# Patient Record
Sex: Male | Born: 1960 | Race: White | Hispanic: No | Marital: Married
Health system: Midwestern US, Community
[De-identification: ages and names within clinical notes are randomized; demographics above are authoritative.]

## PROBLEM LIST (undated history)

## (undated) DIAGNOSIS — E785 Hyperlipidemia, unspecified: Secondary | ICD-10-CM

## (undated) DIAGNOSIS — M1990 Unspecified osteoarthritis, unspecified site: Secondary | ICD-10-CM

## (undated) DIAGNOSIS — K611 Rectal abscess: Secondary | ICD-10-CM

## (undated) DIAGNOSIS — K649 Unspecified hemorrhoids: Secondary | ICD-10-CM

## (undated) HISTORY — DX: Hyperlipidemia, unspecified: E78.5

## (undated) HISTORY — DX: Rectal abscess: K61.1

## (undated) HISTORY — DX: Unspecified hemorrhoids: K64.9

---

## 2004-07-18 HISTORY — PX: HERNIA REPAIR: SHX51

## 2004-07-18 HISTORY — PX: ANAL FISSURE REPAIR: SHX2312

## 2017-07-19 ENCOUNTER — Other Ambulatory Visit: Payer: Self-pay | Admitting: Physician Assistant

## 2017-08-08 ENCOUNTER — Ambulatory Visit: Payer: Self-pay | Admitting: Internal Medicine

## 2017-08-28 ENCOUNTER — Ambulatory Visit: Payer: No Typology Code available for payment source | Admitting: Internal Medicine

## 2017-08-28 ENCOUNTER — Encounter: Payer: Self-pay | Admitting: Internal Medicine

## 2017-08-28 VITALS — BP 126/84 | HR 52 | Temp 97.5°F | Resp 16 | Ht 71.0 in | Wt 190.0 lb

## 2017-08-28 DIAGNOSIS — R5383 Other fatigue: Secondary | ICD-10-CM

## 2017-08-28 DIAGNOSIS — Z0000 Encounter for general adult medical examination without abnormal findings: Secondary | ICD-10-CM | POA: Diagnosis not present

## 2017-08-28 DIAGNOSIS — E559 Vitamin D deficiency, unspecified: Secondary | ICD-10-CM

## 2017-08-28 DIAGNOSIS — E782 Mixed hyperlipidemia: Secondary | ICD-10-CM

## 2017-08-28 DIAGNOSIS — Z125 Encounter for screening for malignant neoplasm of prostate: Secondary | ICD-10-CM

## 2017-08-28 DIAGNOSIS — Z79899 Other long term (current) drug therapy: Secondary | ICD-10-CM

## 2017-08-28 DIAGNOSIS — Z136 Encounter for screening for cardiovascular disorders: Secondary | ICD-10-CM

## 2017-08-28 DIAGNOSIS — I10 Essential (primary) hypertension: Secondary | ICD-10-CM | POA: Diagnosis not present

## 2017-08-28 DIAGNOSIS — E291 Testicular hypofunction: Secondary | ICD-10-CM

## 2017-08-28 DIAGNOSIS — Z0001 Encounter for general adult medical examination with abnormal findings: Secondary | ICD-10-CM

## 2017-08-28 DIAGNOSIS — Z131 Encounter for screening for diabetes mellitus: Secondary | ICD-10-CM

## 2017-08-28 NOTE — Patient Instructions (Signed)

## 2017-08-28 NOTE — Progress Notes (Signed)
[facility]   Lucky Cowboy, M.D.     Dyanne Carrel. Steffanie Dunn, P.A.-C Judd Gaudier, DNP [facility]                [address]                [place], [place]. [address] Telephone [phone] Telefax [phone] Annual  Screening/Preventative Visit  & Comprehensive Evaluation & Examination     This very nice 57 y.o. MWM recently moved here from [place]  presents to establish and for a Screening/Preventative Visit & comprehensive evaluation.  He does relate hx/o elevated cholesterol in the past.      Patient is screened expectantly for elevated BP and today's BP is at goal - 126/84. Patient denies any cardiac symptoms as chest pain, palpitations, shortness of breath, dizziness or ankle swelling.     Patient's lipidemia have been elevated in the past and for which he's been recommended diet.          Patient is also expectantly screened for prediabetes and he denies reactive hypoglycemic symptoms, visual blurring, diabetic polys or paresthesias.      Finally, patient is anticipated to have Vitamin D Deficiency.  No Known Allergies   History reviewed. No pertinent past medical history.   Health Maintenance  Topic Date Due  . Hepatitis C Screening  [birth year]  . HIV Screening  03/14/1978  . TETANUS/TDAP  03/14/1982  . COLONOSCOPY  03/14/2013  . INFLUENZA VACCINE  02/15/2017   Last Colon - age 83 & recc 10 yr f/u  Family History  Problem Relation Age of Onset  . Dementia Mother   . Cancer Father   . Dementia Father     Social History   Socioeconomic History  . Marital status: Married    Spouse name: Victorino Dike  . Number of children: 1 daughter  Occupational History  . Furniture [employer]  Tobacco Use  . Smoking status: Never Smoker  . Smokeless tobacco: Never Used  Substance and Sexual Activity  . Alcohol use: Yes    Comment: occasional  . Drug use: No  . Sexual activity: Active     ROS Constitutional: Denies fever, chills, weight loss/gain, headaches, insomnia,  night sweats or change in appetite. Does c/o fatigue. Eyes: Denies redness, blurred vision, diplopia, discharge, itchy or watery eyes.  ENT: Denies discharge, congestion, post nasal drip, epistaxis, sore throat, earache, hearing loss, dental pain, Tinnitus, Vertigo, Sinus pain or snoring.  Cardio: Denies chest pain, palpitations, irregular heartbeat, syncope, dyspnea, diaphoresis, orthopnea, PND, claudication or edema Respiratory: denies cough, dyspnea, DOE, pleurisy, hoarseness, laryngitis or wheezing.  Gastrointestinal: Denies dysphagia, heartburn, reflux, water brash, pain, cramps, nausea, vomiting, bloating, diarrhea, constipation, hematemesis, melena, hematochezia, jaundice or hemorrhoids Genitourinary: Denies dysuria, frequency, urgency, nocturia, hesitancy, discharge, hematuria or flank pain Musculoskeletal: Denies arthralgia, myalgia, stiffness, Jt. Swelling, pain, limp or strain/sprain. Denies Falls. Skin: Denies puritis, rash, hives, warts, acne, eczema or change in skin lesion Neuro: No weakness, tremor, incoordination, spasms, paresthesia or pain Psychiatric: Denies confusion, memory loss or sensory loss. Denies Depression. Endocrine: Denies change in weight, skin, hair change, nocturia, and paresthesia, diabetic polys, visual blurring or hyper / hypo glycemic episodes.  Heme/Lymph: No excessive bleeding, bruising or enlarged lymph nodes.  Physical Exam  BP 126/84   Pulse (!) 52   Temp (!) 97.5 F (36.4 C)   Resp 16   Ht 5\' 11"  (1.803 m)   Wt 190 lb (86.2 kg)  BMI 26.50 kg/m   General Appearance: Well nourished and well groomed and in no apparent distress.  Eyes: PERRLA, EOMs, conjunctiva no swelling or erythema, normal fundi and vessels. Sinuses: No frontal/maxillary tenderness ENT/Mouth: EACs patent / TMs  nl. Nares clear without erythema, swelling, mucoid exudates. Oral hygiene is good. No  erythema, swelling, or exudate. Tongue normal, non-obstructing. Tonsils not swollen or erythematous. Hearing normal.  Neck: Supple, thyroid not palpable. No bruits, nodes or JVD. Respiratory: Respiratory effort normal.  BS equal and clear bilateral without rales, rhonci, wheezing or stridor. Cardio: Heart sounds are normal with regular rate and rhythm and no murmurs, rubs or gallops. Peripheral pulses are normal and equal bilaterally without edema. No aortic or femoral bruits. Chest: symmetric with normal excursions and percussion.  Abdomen: Soft, with Nl bowel sounds. Nontender, no guarding, rebound, hernias, masses, or organomegaly.  Lymphatics: Non tender without lymphadenopathy.  Genitourinary: No hernias.Testes nl. DRE - prostate nl for age - smooth & firm w/o nodules. Musculoskeletal: Full ROM all peripheral extremities, joint stability, 5/5 strength, and normal gait. Skin: Warm and dry without rashes, lesions, cyanosis, clubbing or  ecchymosis.  Neuro: Cranial nerves intact, reflexes equal bilaterally. Normal muscle tone, no cerebellar symptoms. Sensation intact.  Pysch: Alert and oriented X 3 with normal affect, insight and judgment appropriate.   Assessment and Plan  1. Annual Preventative/Screening Exam   2. Screening for hypertension  - Urinalysis, Routine w reflex microscopic - Microalbumin / creatinine urine ratio - CBC with Differential/Platelet - BASIC METABOLIC PANEL WITH GFR - Magnesium - TSH  3. Hyperlipidemia, mixed  - Hepatic function panel - Lipid panel - TSH  4. Diabetes mellitus screening  - Hemoglobin A1c - Insulin, random  5. Vitamin D deficiency  - VITAMIN D 25 Hydroxy   6. Prostate cancer screening  - PSA  7. Fatigue  - Iron,Total/Total Iron Binding Cap - Vitamin B12 - Testosterone - CBC with Differential/Platelet - TSH  8. Medication management  - Urinalysis, Routine w reflex microscopic - Microalbumin / creatinine urine ratio -  CBC with Differential/Platelet - BASIC METABOLIC PANEL WITH GFR - Hepatic function panel - Magnesium - Lipid panel - TSH - Hemoglobin A1c - Insulin, random - VITAMIN D 25 Hydroxy         Patient was counseled in prudent diet, weight control to achieve/maintain BMI less than 25, BP monitoring, regular exercise and medications as discussed.  Discussed med effects and SE's. Routine screening labs and tests as requested with regular follow-up as recommended. Over 40 minutes of exam, counseling, chart review and high complex critical decision making was performed

## 2017-08-29 ENCOUNTER — Other Ambulatory Visit: Payer: Self-pay | Admitting: Internal Medicine

## 2017-08-29 ENCOUNTER — Encounter: Payer: Self-pay | Admitting: Internal Medicine

## 2017-08-29 DIAGNOSIS — E559 Vitamin D deficiency, unspecified: Secondary | ICD-10-CM

## 2017-08-29 DIAGNOSIS — E782 Mixed hyperlipidemia: Secondary | ICD-10-CM

## 2017-08-29 LAB — CBC WITH DIFFERENTIAL/PLATELET
BASOS PCT: 0.5 %
Basophils Absolute: 21 cells/uL (ref 0–200)
Eosinophils Absolute: 92 cells/uL (ref 15–500)
Eosinophils Relative: 2.2 %
HCT: 45.4 % (ref 38.5–50.0)
Hemoglobin: 15.5 g/dL (ref 13.2–17.1)
Lymphs Abs: 1168 cells/uL (ref 850–3900)
MCH: 30.7 pg (ref 27.0–33.0)
MCHC: 34.1 g/dL (ref 32.0–36.0)
MCV: 89.9 fL (ref 80.0–100.0)
MPV: 10.1 fL (ref 7.5–12.5)
Monocytes Relative: 6 %
NEUTROS PCT: 63.5 %
Neutro Abs: 2667 cells/uL (ref 1500–7800)
PLATELETS: 214 10*3/uL (ref 140–400)
RBC: 5.05 10*6/uL (ref 4.20–5.80)
RDW: 12.7 % (ref 11.0–15.0)
TOTAL LYMPHOCYTE: 27.8 %
WBC: 4.2 10*3/uL (ref 3.8–10.8)
WBCMIX: 252 {cells}/uL (ref 200–950)

## 2017-08-29 LAB — URINALYSIS, ROUTINE W REFLEX MICROSCOPIC
Bilirubin Urine: NEGATIVE
Glucose, UA: NEGATIVE
HGB URINE DIPSTICK: NEGATIVE
Leukocytes, UA: NEGATIVE
Nitrite: NEGATIVE
PROTEIN: NEGATIVE
Specific Gravity, Urine: 1.011 (ref 1.001–1.03)
pH: 6 (ref 5.0–8.0)

## 2017-08-29 LAB — HEPATIC FUNCTION PANEL
AG Ratio: 1.9 (calc) (ref 1.0–2.5)
ALBUMIN MSPROF: 4.6 g/dL (ref 3.6–5.1)
ALKALINE PHOSPHATASE (APISO): 61 U/L (ref 40–115)
ALT: 15 U/L (ref 9–46)
AST: 13 U/L (ref 10–35)
Bilirubin, Direct: 0.1 mg/dL (ref 0.0–0.2)
Globulin: 2.4 g/dL (calc) (ref 1.9–3.7)
Indirect Bilirubin: 0.5 mg/dL (calc) (ref 0.2–1.2)
TOTAL PROTEIN: 7 g/dL (ref 6.1–8.1)
Total Bilirubin: 0.6 mg/dL (ref 0.2–1.2)

## 2017-08-29 LAB — HEMOGLOBIN A1C
EAG (MMOL/L): 5.2 (calc)
Hgb A1c MFr Bld: 4.9 % of total Hgb (ref <5.7)
MEAN PLASMA GLUCOSE: 94 (calc)

## 2017-08-29 LAB — BASIC METABOLIC PANEL WITH GFR
BUN: 13 mg/dL (ref 7–25)
CALCIUM: 10.1 mg/dL (ref 8.6–10.3)
CHLORIDE: 105 mmol/L (ref 98–110)
CO2: 29 mmol/L (ref 20–32)
Creat: 0.79 mg/dL (ref 0.70–1.33)
GFR, EST NON AFRICAN AMERICAN: 102 mL/min/{1.73_m2} (ref >=60)
GFR, Est African American: 118 mL/min/{1.73_m2} (ref >=60)
GLUCOSE: 88 mg/dL (ref 65–99)
POTASSIUM: 4.4 mmol/L (ref 3.5–5.3)
SODIUM: 139 mmol/L (ref 135–146)

## 2017-08-29 LAB — IRON, TOTAL/TOTAL IRON BINDING CAP
%SAT: 22 % (calc) (ref 15–60)
IRON: 77 ug/dL (ref 50–180)
TIBC: 350 mcg/dL (calc) (ref 250–425)

## 2017-08-29 LAB — LIPID PANEL
CHOLESTEROL: 229 mg/dL — AB (ref <200)
HDL: 59 mg/dL (ref >40)
LDL Cholesterol (Calc): 141 mg/dL (calc) — ABNORMAL HIGH
Non-HDL Cholesterol (Calc): 170 mg/dL (calc) — ABNORMAL HIGH (ref <130)
TRIGLYCERIDES: 156 mg/dL — AB (ref <150)
Total CHOL/HDL Ratio: 3.9 (calc) (ref <5.0)

## 2017-08-29 LAB — INSULIN, RANDOM: INSULIN: 3.5 u[IU]/mL (ref 2.0–19.6)

## 2017-08-29 LAB — TSH: TSH: 1.39 mIU/L (ref 0.40–4.50)

## 2017-08-29 LAB — MICROALBUMIN / CREATININE URINE RATIO
CREATININE, URINE: 66 mg/dL (ref 20–320)
Microalb, Ur: <0.2 mg/dL

## 2017-08-29 LAB — MAGNESIUM: MAGNESIUM: 2.3 mg/dL (ref 1.5–2.5)

## 2017-08-29 LAB — PSA: PSA: 1.6 ng/mL (ref <=4.0)

## 2017-08-29 LAB — VITAMIN D 25 HYDROXY (VIT D DEFICIENCY, FRACTURES): Vit D, 25-Hydroxy: 19 ng/mL — ABNORMAL LOW (ref 30–100)

## 2017-08-29 LAB — VITAMIN B12: Vitamin B-12: 569 pg/mL (ref 200–1100)

## 2017-08-29 LAB — TESTOSTERONE: TESTOSTERONE: 473 ng/dL (ref 250–827)

## 2017-11-27 ENCOUNTER — Other Ambulatory Visit: Payer: No Typology Code available for payment source

## 2017-11-27 DIAGNOSIS — Z13220 Encounter for screening for lipoid disorders: Secondary | ICD-10-CM | POA: Diagnosis not present

## 2017-11-27 DIAGNOSIS — E782 Mixed hyperlipidemia: Secondary | ICD-10-CM

## 2017-11-27 DIAGNOSIS — E559 Vitamin D deficiency, unspecified: Secondary | ICD-10-CM | POA: Diagnosis not present

## 2017-11-28 LAB — LIPID PANEL
Cholesterol: 237 mg/dL — ABNORMAL HIGH (ref <200)
HDL: 67 mg/dL (ref >40)
LDL Cholesterol (Calc): 139 mg/dL (calc) — ABNORMAL HIGH
Non-HDL Cholesterol (Calc): 170 mg/dL (calc) — ABNORMAL HIGH (ref <130)
TRIGLYCERIDES: 172 mg/dL — AB (ref <150)
Total CHOL/HDL Ratio: 3.5 (calc) (ref <5.0)

## 2017-11-28 LAB — VITAMIN D 25 HYDROXY (VIT D DEFICIENCY, FRACTURES): VIT D 25 HYDROXY: 31 ng/mL (ref 30–100)

## 2018-03-07 ENCOUNTER — Ambulatory Visit: Payer: Self-pay | Admitting: Internal Medicine

## 2018-03-14 ENCOUNTER — Encounter: Payer: Self-pay | Admitting: Internal Medicine

## 2018-03-14 ENCOUNTER — Ambulatory Visit (INDEPENDENT_AMBULATORY_CARE_PROVIDER_SITE_OTHER): Payer: No Typology Code available for payment source | Admitting: Internal Medicine

## 2018-03-14 VITALS — BP 116/74 | HR 52 | Temp 97.6°F | Resp 14 | Ht 71.0 in | Wt 189.2 lb

## 2018-03-14 DIAGNOSIS — E782 Mixed hyperlipidemia: Secondary | ICD-10-CM

## 2018-03-14 DIAGNOSIS — E559 Vitamin D deficiency, unspecified: Secondary | ICD-10-CM | POA: Diagnosis not present

## 2018-03-14 DIAGNOSIS — Z79899 Other long term (current) drug therapy: Secondary | ICD-10-CM | POA: Diagnosis not present

## 2018-03-14 DIAGNOSIS — Z136 Encounter for screening for cardiovascular disorders: Secondary | ICD-10-CM | POA: Diagnosis not present

## 2018-03-14 NOTE — Patient Instructions (Signed)

## 2018-03-14 NOTE — Progress Notes (Signed)
This very nice 57 y.o. MWM presents for 6 month follow up with HTN, HLD, Pre-Diabetes and Vitamin D Deficiency.      Patient is followed expecantly  for labile HTN & BP has been controlled at home. Today's BP is at goal -  116/74. Patient has had no complaints of any cardiac type chest pain, palpitations, dyspnea / orthopnea / PND, dizziness, claudication, or dependent edema.     Hyperlipidemia is not  controlled with diet. Last Lipids were not at goal: Lab Results  Component Value Date   CHOL 237 (H) 11/27/2017   HDL 67 11/27/2017   LDLCALC 139 (H) 11/27/2017   TRIG 172 (H) 11/27/2017   CHOLHDL 3.5 11/27/2017      Also, the patient is likewise expectantly for PreDiabetes and has had no symptoms of reactive hypoglycemia, diabetic polys, paresthesias or visual blurring.  Last A1c was Normal & at goal: Lab Results  Component Value Date   HGBA1C 4.9 08/28/2017      Further, the patient also has history of Vitamin D Deficiency and supplements vitamin D without any suspected side-effects. Last vitamin D was   Lab Results  Component Value Date   VD25OH 31 11/27/2017   On no meds   No Known Allergies   PMHx: Hyperlipidemia. Vit D Deficiency.  Past Surgical History:  Procedure Laterality Date  . ANAL FISSURE REPAIR  2006  . HERNIA REPAIR Right 2006   FHx:    Reviewed / unchanged  SHx:    Reviewed / unchanged   Systems Review:  Constitutional: Denies fever, chills, wt changes, headaches, insomnia, fatigue, night sweats, change in appetite. Eyes: Denies redness, blurred vision, diplopia, discharge, itchy, watery eyes.  ENT: Denies discharge, congestion, post nasal drip, epistaxis, sore throat, earache, hearing loss, dental pain, tinnitus, vertigo, sinus pain, snoring.  CV: Denies chest pain, palpitations, irregular heartbeat, syncope, dyspnea, diaphoresis, orthopnea, PND, claudication or edema. Respiratory: denies cough, dyspnea, DOE, pleurisy, hoarseness, laryngitis,  wheezing.  Gastrointestinal: Denies dysphagia, odynophagia, heartburn, reflux, water brash, abdominal pain or cramps, nausea, vomiting, bloating, diarrhea, constipation, hematemesis, melena, hematochezia  or hemorrhoids. Genitourinary: Denies dysuria, frequency, urgency, nocturia, hesitancy, discharge, hematuria or flank pain. Musculoskeletal: Denies arthralgias, myalgias, stiffness, jt. swelling, pain, limping or strain/sprain.  Skin: Denies pruritus, rash, hives, warts, acne, eczema or change in skin lesion(s). Neuro: No weakness, tremor, incoordination, spasms, paresthesia or pain. Psychiatric: Denies confusion, memory loss or sensory loss. Endo: Denies change in weight, skin or hair change.  Heme/Lymph: No excessive bleeding, bruising or enlarged lymph nodes.  Physical Exam  BP 116/74   Pulse (!) 52   Temp 97.6 F (36.4 C)   Resp 14   Ht 5\' 11"  (1.803 m)   Wt 189 lb 3.2 oz (85.8 kg)   SpO2 98%   BMI 26.39 kg/m   Appears  well nourished, well groomed  and in no distress.  Eyes: PERRLA, EOMs, conjunctiva no swelling or erythema. Sinuses: No frontal/maxillary tenderness ENT/Mouth: EAC's clear, TM's nl w/o erythema, bulging. Nares clear w/o erythema, swelling, exudates. Oropharynx clear without erythema or exudates. Oral hygiene is good. Tongue normal, non obstructing. Hearing intact.  Neck: Supple. Thyroid not palpable. Car 2+/2+ without bruits, nodes or JVD. Chest: Respirations nl with BS clear & equal w/o rales, rhonchi, wheezing or stridor.  Cor: Heart sounds normal w/ regular rate and rhythm without sig. murmurs, gallops, clicks or rubs. Peripheral pulses normal and equal  without edema.  Abdomen: Soft & bowel sounds  normal. Non-tender w/o guarding, rebound, hernias, masses or organomegaly.  Lymphatics: Unremarkable.  Musculoskeletal: Full ROM all peripheral extremities, joint stability, 5/5 strength and normal gait.  Skin: Warm, dry without exposed rashes, lesions or  ecchymosis apparent.  Neuro: Cranial nerves intact, reflexes equal bilaterally. Sensory-motor testing grossly intact. Tendon reflexes grossly intact.  Pysch: Alert & oriented x 3.  Insight and judgement nl & appropriate. No ideations.  Assessment and Plan:  1. Screening for hypertension  - Continue medication, monitor blood pressure at home.  - Continue DASH diet.  Reminder to go to the ER if any CP,  SOB, nausea, dizziness, severe HA, changes vision/speech.   2. Hyperlipidemia, mixed  - Continue diet/meds, exercise,& lifestyle modifications.  - Continue monitor periodic cholesterol/liver & renal functions   - Lipid panel  3. Vitamin D deficiency  - Continue diet, exercise, lifestyle modifications.  - Monitor appropriate labs.  - Continue supplementation.   - VITAMIN D 25 Hydroxyl  4. Medication management  - Lipid panel - VITAMIN D 25 Hydroxyol      Discussed  regular exercise, BP monitoring, weight control to achieve/maintain BMI less than 25 and discussed med and SE's. Recommended labs to assess and monitor clinical status with further disposition pending results of labs. Over 30 minutes of exam, counseling, chart review was performed.

## 2018-03-15 ENCOUNTER — Other Ambulatory Visit: Payer: Self-pay | Admitting: Internal Medicine

## 2018-03-15 DIAGNOSIS — E782 Mixed hyperlipidemia: Secondary | ICD-10-CM

## 2018-03-15 LAB — LIPID PANEL
CHOLESTEROL: 217 mg/dL — AB (ref <200)
HDL: 63 mg/dL (ref >40)
LDL Cholesterol (Calc): 124 mg/dL (calc) — ABNORMAL HIGH
Non-HDL Cholesterol (Calc): 154 mg/dL (calc) — ABNORMAL HIGH (ref <130)
Total CHOL/HDL Ratio: 3.4 (calc) (ref <5.0)
Triglycerides: 178 mg/dL — ABNORMAL HIGH (ref <150)

## 2018-03-15 LAB — VITAMIN D 25 HYDROXY (VIT D DEFICIENCY, FRACTURES): VIT D 25 HYDROXY: 43 ng/mL (ref 30–100)

## 2018-03-15 MED ORDER — ROSUVASTATIN CALCIUM 40 MG PO TABS: ORAL_TABLET | ORAL | 5 refills | Status: DC

## 2018-03-19 ENCOUNTER — Encounter: Payer: Self-pay | Admitting: Internal Medicine

## 2018-06-21 DIAGNOSIS — Z6826 Body mass index (BMI) 26.0-26.9, adult: Secondary | ICD-10-CM | POA: Insufficient documentation

## 2018-06-21 NOTE — Progress Notes (Signed)
FOLLOW UP  Assessment and Plan:   Cholesterol Newly on rosuvastatin 20 mg every other day  Continue low cholesterol diet and exercise.  Check lipid panel.   Overweight Long discussion about weight loss, diet, and exercise Recommended diet heavy in fruits and veggies and low in animal meats, cheeses, and dairy products, appropriate calorie intake Discussed ideal weight for height and initial weight goal  Will follow up in 3 months  Vitamin D Def Below goal at last visit;  He has increased to 10000 IU daily  continue supplementation to maintain goal of 70-100 Check Vit D level  Continue diet and meds as discussed. Further disposition pending results of labs. Discussed med's effects and SE's.   Over 30 minutes of exam, counseling, chart review, and critical decision making was performed.   Future Appointments  Date Time Provider Department Center  09/20/2018  2:00 PM Lucky Cowboy, MD [id number] None    ----------------------------------------------------------------------------------------------------------------------  HPI 57 y.o. male  presents for 3 month follow up on cholesterol, weight and vitamin D deficiency.   BMI is Body mass index is 26.64 kg/m., he has been working on diet and exercise, he is avoiding red meat. He is limiting processed food as well. He walks every other day, lifts weight.  Wt Readings from Last 3 Encounters:  06/25/18 191 lb (86.6 kg)  03/14/18 189 lb 3.2 oz (85.8 kg)  08/28/17 190 lb (86.2 kg)   Today their BP is BP: 110/66  He does workout. He denies chest pain, shortness of breath, dizziness.   He is on cholesterol medication Rosuvastatin 20 mg every other and denies myalgias. His cholesterol is not at goal. The cholesterol last visit was:   Lab Results  Component Value Date   CHOL 217 (H) 03/14/2018   HDL 63 03/14/2018   LDLCALC 124 (H) 03/14/2018   TRIG 178 (H) 03/14/2018   CHOLHDL 3.4 03/14/2018   Patient is on Vitamin D  supplement,   Lab Results  Component Value Date   VD25OH 43 03/14/2018       Current Medications:  Current Outpatient Medications on File Prior to Visit  Medication Sig  . Cholecalciferol (VITAMIN D3 PO) Take 25,000 Units by mouth 5 (five) times daily.  . rosuvastatin (CRESTOR) 40 MG tablet Take 1/2 to 1 tablet daily or as directed for Cholesterol   No current facility-administered medications on file prior to visit.      Allergies: No Known Allergies   Medical History:  Past Medical History:  Diagnosis Date  . Hyperlipidemia    Family history- Reviewed and unchanged Social history- Reviewed and unchanged   Review of Systems:  Review of Systems  Constitutional: Negative for malaise/fatigue and weight loss.  HENT: Negative for hearing loss and tinnitus.   Eyes: Negative for blurred vision and double vision.  Respiratory: Negative for cough, shortness of breath and wheezing.   Cardiovascular: Negative for chest pain, palpitations, orthopnea, claudication and leg swelling.  Gastrointestinal: Negative for abdominal pain, blood in stool, constipation, diarrhea, heartburn, melena, nausea and vomiting.  Genitourinary: Negative.   Musculoskeletal: Negative for joint pain and myalgias.  Skin: Negative for rash.  Neurological: Negative for dizziness, tingling, sensory change, weakness and headaches.  Endo/Heme/Allergies: Negative for polydipsia.  Psychiatric/Behavioral: Negative.   All other systems reviewed and are negative.     Physical Exam: BP 110/66   Pulse (!) 49   Temp 97.7 F (36.5 C)   Ht 5\' 11"  (1.803 m)   Wt 191 lb (86.6  kg)   SpO2 96%   BMI 26.64 kg/m  Wt Readings from Last 3 Encounters:  06/25/18 191 lb (86.6 kg)  03/14/18 189 lb 3.2 oz (85.8 kg)  08/28/17 190 lb (86.2 kg)   General Appearance: Well nourished, in no apparent distress. Eyes: PERRLA, conjunctiva no swelling or erythema Sinuses: No Frontal/maxillary tenderness ENT/Mouth: Ext aud canals  clear, TMs without erythema, bulging. No erythema, swelling, or exudate on post pharynx.  Tonsils not swollen or erythematous. Hearing normal.  Neck: Supple, thyroid normal.  Respiratory: Respiratory effort normal, BS equal bilaterally without rales, rhonchi, wheezing or stridor.  Cardio: RRR with no MRGs. Brisk peripheral pulses without edema.  Abdomen: Soft, + BS.  Non tender, no guarding, rebound, hernias, masses. Lymphatics: Non tender without lymphadenopathy.  Musculoskeletal: Full ROM, 5/5 strength, Normal gait Skin: Warm, dry without rashes, lesions, ecchymosis.  Neuro: Cranial nerves intact. No cerebellar symptoms.  Psych: Awake and oriented X 3, normal affect, Insight and Judgment appropriate.    Dan Maker, NP 10:43 AM [facility]

## 2018-06-25 ENCOUNTER — Ambulatory Visit (INDEPENDENT_AMBULATORY_CARE_PROVIDER_SITE_OTHER): Payer: No Typology Code available for payment source | Admitting: Adult Health

## 2018-06-25 ENCOUNTER — Encounter: Payer: Self-pay | Admitting: Adult Health

## 2018-06-25 VITALS — BP 110/66 | HR 49 | Temp 97.7°F | Ht 71.0 in | Wt 191.0 lb

## 2018-06-25 DIAGNOSIS — E559 Vitamin D deficiency, unspecified: Secondary | ICD-10-CM

## 2018-06-25 DIAGNOSIS — Z79899 Other long term (current) drug therapy: Secondary | ICD-10-CM | POA: Diagnosis not present

## 2018-06-25 DIAGNOSIS — Z6826 Body mass index (BMI) 26.0-26.9, adult: Secondary | ICD-10-CM

## 2018-06-25 DIAGNOSIS — E782 Mixed hyperlipidemia: Secondary | ICD-10-CM

## 2018-06-25 NOTE — Patient Instructions (Addendum)
Goals    . LDL CALC < 100        Preventing High Cholesterol Cholesterol is a waxy, fat-like substance that your body needs in small amounts. Your liver makes all the cholesterol that your body needs. Having high cholesterol (hypercholesterolemia) increases your risk for heart disease and stroke. Extra (excess) cholesterol comes from the food you eat, such as animal-based fat (saturated fat) from meat and some dairy products. High cholesterol can often be prevented with diet and lifestyle changes. If you already have high cholesterol, you can control it with diet and lifestyle changes, as well as medicine. What nutrition changes can be made?  Eat less saturated fat. Foods that contain saturated fat include red meat and some dairy products.  Avoid processed meats, like bacon and lunch meats.  Avoid trans fats, which are found in margarine and some baked goods.  Avoid foods and beverages that have added sugars.  Eat more fruits, vegetables, and whole grains.  Choose healthy sources of protein, such as fish, poultry, and nuts.  Choose healthy sources of fat, such as: ? Nuts. ? Vegetable oils, especially olive oil. ? Fish that have healthy fats (omega-3 fatty acids), such as mackerel or salmon. What lifestyle changes can be made?  Lose weight if you are overweight. Losing 5-10 lb (2.3-4.5 kg) can help prevent or control high cholesterol and reduce your risk for diabetes and high blood pressure. Ask your health care provider to help you with a diet and exercise plan to safely lose weight.  Get enough exercise. Do at least 150 minutes of moderate-intensity exercise each week. ? You could do this in short exercise sessions several times a day, or you could do longer exercise sessions a few times a week. For example, you could take a brisk 10-minute walk or bike ride, 3 times a day, for 5 days a week.  Do not smoke. If you need help quitting, ask your health care provider.  Limit your  alcohol intake. If you drink alcohol, limit alcohol intake to no more than 1 drink a day for nonpregnant women and 2 drinks a day for men. One drink equals 12 oz of beer, 5 oz of wine, or 1 oz of hard liquor. Why are these changes important? If you have high cholesterol, deposits (plaques) may build up on the walls of your blood vessels. Plaques make the arteries narrower and stiffer, which can restrict or block blood flow and cause blood clots to form. This greatly increases your risk for heart attack and stroke. Making diet and lifestyle changes can reduce your risk for these life-threatening conditions. What can I do to lower my risk?  Manage your risk factors for high cholesterol. Talk with your health care provider about all of your risk factors and how to lower your risk.  Manage other conditions that you have, such as diabetes or high blood pressure (hypertension).  Have your cholesterol checked at regular intervals.  Keep all follow-up visits as told by your health care provider. This is important. How is this treated? In addition to diet and lifestyle changes, your health care provider may recommend medicines to help lower cholesterol, such as a medicine to reduce the amount of cholesterol made in your liver. You may need medicine if:  Diet and lifestyle changes do not lower your cholesterol enough.  You have high cholesterol and other risk factors for heart disease or stroke.  Take over-the-counter and prescription medicines only as told by your health care   provider. Where to find more information:  American Heart Association: www.heart.org/HEARTORG/Conditions/Cholesterol/Cholesterol_UCM_001089_SubHomePage.jsp  National Heart, Lung, and Blood Institute: www.nhlbi.nih.gov/health/resources/heart/heart-cholesterol-hbc-what-html Summary  High cholesterol increases your risk for heart disease and stroke. By keeping your cholesterol level low, you can reduce your risk for these  conditions.  Diet and lifestyle changes are the most important steps in preventing high cholesterol.  Work with your health care provider to manage your risk factors, and have your blood tested regularly. This information is not intended to replace advice given to you by your health care provider. Make sure you discuss any questions you have with your health care provider. Document Released: 07/19/2015 Document Revised: 03/12/2016 Document Reviewed: 03/12/2016 Elsevier Interactive Patient Education  2018 [employer] Inc.  

## 2018-06-26 ENCOUNTER — Other Ambulatory Visit: Payer: Self-pay | Admitting: Adult Health

## 2018-06-26 LAB — COMPLETE METABOLIC PANEL WITH GFR
AG Ratio: 1.9 (calc) (ref 1.0–2.5)
ALT: 15 U/L (ref 9–46)
AST: 15 U/L (ref 10–35)
Albumin: 4.5 g/dL (ref 3.6–5.1)
Alkaline phosphatase (APISO): 65 U/L (ref 40–115)
BUN: 15 mg/dL (ref 7–25)
CO2: 30 mmol/L (ref 20–32)
Calcium: 10.3 mg/dL (ref 8.6–10.3)
Chloride: 104 mmol/L (ref 98–110)
Creat: 0.83 mg/dL (ref 0.70–1.33)
GFR, EST AFRICAN AMERICAN: 115 mL/min/{1.73_m2} (ref >=60)
GFR, Est Non African American: 99 mL/min/{1.73_m2} (ref >=60)
GLOBULIN: 2.4 g/dL (ref 1.9–3.7)
Glucose, Bld: 77 mg/dL (ref 65–99)
Potassium: 4.3 mmol/L (ref 3.5–5.3)
Sodium: 139 mmol/L (ref 135–146)
Total Bilirubin: 0.7 mg/dL (ref 0.2–1.2)
Total Protein: 6.9 g/dL (ref 6.1–8.1)

## 2018-06-26 LAB — LIPID PANEL
Cholesterol: 138 mg/dL (ref <200)
HDL: 61 mg/dL (ref >40)
LDL Cholesterol (Calc): 60 mg/dL (calc)
Non-HDL Cholesterol (Calc): 77 mg/dL (calc) (ref <130)
Total CHOL/HDL Ratio: 2.3 (calc) (ref <5.0)
Triglycerides: 86 mg/dL (ref <150)

## 2018-06-26 LAB — VITAMIN D 25 HYDROXY (VIT D DEFICIENCY, FRACTURES): Vit D, 25-Hydroxy: 51 ng/mL (ref 30–100)

## 2018-09-20 ENCOUNTER — Encounter: Payer: Self-pay | Admitting: Internal Medicine

## 2018-11-27 ENCOUNTER — Encounter: Payer: Self-pay | Admitting: Internal Medicine

## 2018-11-27 NOTE — Progress Notes (Signed)
   R  E  S  C  H  E  D  U L  E D  Due   To    Recent  COVID-19        Exposure                                                                                                                                                                  This very nice 58 y.o. MWM presents for a Screening /Preventative Visit & comprehensive evaluation and management of multiple medical co-morbidities.  Patient has been followed for HTN, HLD, Prediabetes and Vitamin D Deficiency.      Patient is followed expectantly for elevated BP and  BP has been controlled at home.  Today's  . Patient denies any cardiac symptoms as chest pain, palpitations, shortness of breath, dizziness or ankle swelling. BP Readings from Last 3 Encounters:  06/25/18 110/66  03/14/18 116/74  08/28/17 126/84       Patient's hyperlipidemia is controlled with diet and medications. Patient denies myalgias or other medication SE's. Last lipids were at goal: Lab Results  Component Value Date   CHOL 138 06/25/2018   HDL 61 06/25/2018   LDLCALC 60 06/25/2018   TRIG 86 06/25/2018   CHOLHDL 2.3 06/25/2018      Patient is also screened expectantly for glucose intolerance and patient denies reactive hypoglycemic symptoms, visual blurring, diabetic polys or paresthesias. Last A1c was Normal & at goal: Lab Results  Component Value Date   HGBA1C 4.9 08/28/2017       Finally, patient has history of Vitamin D Deficiency ("19" / Feb 2019) and last vitamin D was near goal (70-100): Lab Results  Component Value Date   VD25OH 51 06/25/2018

## 2018-11-28 ENCOUNTER — Ambulatory Visit: Payer: Self-pay | Admitting: Internal Medicine

## 2019-02-19 ENCOUNTER — Ambulatory Visit (INDEPENDENT_AMBULATORY_CARE_PROVIDER_SITE_OTHER): Payer: No Typology Code available for payment source | Admitting: Internal Medicine

## 2019-02-19 ENCOUNTER — Encounter: Payer: Self-pay | Admitting: Internal Medicine

## 2019-02-19 ENCOUNTER — Other Ambulatory Visit: Payer: Self-pay

## 2019-02-19 VITALS — BP 126/72 | HR 64 | Temp 97.2°F | Resp 16 | Ht 71.0 in | Wt 186.6 lb

## 2019-02-19 DIAGNOSIS — R030 Elevated blood-pressure reading, without diagnosis of hypertension: Secondary | ICD-10-CM

## 2019-02-19 DIAGNOSIS — Z111 Encounter for screening for respiratory tuberculosis: Secondary | ICD-10-CM

## 2019-02-19 DIAGNOSIS — Z0001 Encounter for general adult medical examination with abnormal findings: Secondary | ICD-10-CM

## 2019-02-19 DIAGNOSIS — R7309 Other abnormal glucose: Secondary | ICD-10-CM | POA: Diagnosis not present

## 2019-02-19 DIAGNOSIS — Z136 Encounter for screening for cardiovascular disorders: Secondary | ICD-10-CM | POA: Diagnosis not present

## 2019-02-19 DIAGNOSIS — Z79899 Other long term (current) drug therapy: Secondary | ICD-10-CM

## 2019-02-19 DIAGNOSIS — E782 Mixed hyperlipidemia: Secondary | ICD-10-CM

## 2019-02-19 DIAGNOSIS — Z125 Encounter for screening for malignant neoplasm of prostate: Secondary | ICD-10-CM

## 2019-02-19 DIAGNOSIS — Z1211 Encounter for screening for malignant neoplasm of colon: Secondary | ICD-10-CM

## 2019-02-19 DIAGNOSIS — E559 Vitamin D deficiency, unspecified: Secondary | ICD-10-CM | POA: Diagnosis not present

## 2019-02-19 DIAGNOSIS — R5383 Other fatigue: Secondary | ICD-10-CM

## 2019-02-19 NOTE — Progress Notes (Signed)
Annual  Screening/Preventative Visit  & Comprehensive Evaluation & Examination     This very nice 58 y.o. MWM presents for a Screening /Preventative Visit & comprehensive evaluation and management of multiple medical co-morbidities.  Patient has been followed for labile HTN, HLD, Prediabetes and Vitamin D Deficiency.     Patient is followed expectantly for elevated BP and  BP has been controlled at home.  Today's BP is at goal - 126/72. Patient denies any cardiac symptoms as chest pain, palpitations, shortness of breath, dizziness or ankle swelling.     Patient's hyperlipidemia is controlled with diet and Rosuvastatin. Patient denies myalgias or other medication SE's. Last lipids were at goal: Lab Results  Component Value Date   CHOL 138 06/25/2018   HDL 61 06/25/2018   LDLCALC 60 06/25/2018   TRIG 86 06/25/2018   CHOLHDL 2.3 06/25/2018      Patient is also screened expectantly for glucose intolerance and patient denies reactive hypoglycemic symptoms, visual blurring, diabetic polys or paresthesias. Last A1c was Normal & at goal: Lab Results  Component Value Date   HGBA1C 4.9 08/28/2017       Finally, patient has history of Vitamin D Deficiency ("19" / Feb 2019) and last vitamin D was near goal (70-100): Lab Results  Component Value Date   VD25OH 51 06/25/2018   Current Outpatient Medications on File Prior to Visit  Medication Sig  . Cholecalciferol (VITAMIN D3 PO) Take 10,000 Units by mouth daily.   . rosuvastatin (CRESTOR) 40 MG tablet Take 1/2 to 1 tablet daily or as directed for Cholesterol   No current facility-administered medications on file prior to visit.    No Known Allergies   Past Medical History:  Diagnosis Date  . Hyperlipidemia    Health Maintenance  Topic Date Due  . Hepatitis C Screening  01/24/1963  . HIV Screening  03/14/1978  . INFLUENZA VACCINE  02/16/2019  . TETANUS/TDAP  07/19/2019 (Originally 03/14/1982)  . COLONOSCOPY  02/16/2023   Last Colon  - 2014 in [place] - & was advised 10 yr f/u due 2024  Past Surgical History:  Procedure Laterality Date  . ANAL FISSURE REPAIR  2006  . HERNIA REPAIR Right 2006   Family History  Problem Relation Age of Onset  . Dementia Mother   . Cancer Father   . Dementia Father    Social History   Socioeconomic History  . Marital status: Married    Spouse name: Victorino Dike  . Number of children: 1 daughter  Occupational History  . Works in Insurance account manager in [employer]  Tobacco Use  . Smoking status: Never Smoker  . Smokeless tobacco: Never Used  Substance and Sexual Activity  . Alcohol use: Yes    Alcohol/week: 6.0 standard drinks    Types: 3 Glasses of wine, 3 Cans of beer per week    Comment: ocassional  . Drug use: No  . Sexual activity: Yes    ROS Constitutional: Denies fever, chills, weight loss/gain, headaches, insomnia,  night sweats or change in appetite. Does c/o fatigue. Eyes: Denies redness, blurred vision, diplopia, discharge, itchy or watery eyes.  ENT: Denies discharge, congestion, post nasal drip, epistaxis, sore throat, earache, hearing loss, dental pain, Tinnitus, Vertigo, Sinus pain or snoring.  Cardio: Denies chest pain, palpitations, irregular heartbeat, syncope, dyspnea, diaphoresis, orthopnea, PND, claudication or edema Respiratory: denies cough, dyspnea, DOE, pleurisy, hoarseness, laryngitis or wheezing.  Gastrointestinal: Denies dysphagia, heartburn, reflux, water brash, pain, cramps, nausea, vomiting, bloating, diarrhea, constipation, hematemesis,  melena, hematochezia, jaundice or hemorrhoids Genitourinary: Denies dysuria, frequency, urgency, nocturia, hesitancy, discharge, hematuria or flank pain Musculoskeletal: Denies arthralgia, myalgia, stiffness, Jt. Swelling, pain, limp or strain/sprain. Denies Falls. Skin: Denies puritis, rash, hives, warts, acne, eczema or change in skin lesion Neuro: No weakness, tremor, incoordination, spasms, paresthesia or  pain Psychiatric: Denies confusion, memory loss or sensory loss. Denies Depression. Endocrine: Denies change in weight, skin, hair change, nocturia, and paresthesia, diabetic polys, visual blurring or hyper / hypo glycemic episodes.  Heme/Lymph: No excessive bleeding, bruising or enlarged lymph nodes.  Physical Exam  BP 126/72   Pulse 64   Temp (!) 97.2 F (36.2 C)   Resp 16   Ht 5\' 11"  (1.803 m)   Wt 186 lb 9.6 oz (84.6 kg)   BMI 26.03 kg/m   General Appearance: Well nourished and well groomed and in no apparent distress.  Eyes: PERRLA, EOMs, conjunctiva no swelling or erythema, normal fundi and vessels. Sinuses: No frontal/maxillary tenderness ENT/Mouth: EACs patent / TMs  nl. Nares clear without erythema, swelling, mucoid exudates. Oral hygiene is good. No erythema, swelling, or exudate. Tongue normal, non-obstructing. Tonsils not swollen or erythematous. Hearing normal.  Neck: Supple, thyroid not palpable. No bruits, nodes or JVD. Respiratory: Respiratory effort normal.  BS equal and clear bilateral without rales, rhonci, wheezing or stridor. Cardio: Heart sounds are normal with regular rate and rhythm and no murmurs, rubs or gallops. Peripheral pulses are normal and equal bilaterally without edema. No aortic or femoral bruits. Chest: symmetric with normal excursions and percussion.  Abdomen: Soft, with Nl bowel sounds. Nontender, no guarding, rebound, hernias, masses, or organomegaly.  Lymphatics: Non tender without lymphadenopathy.  Musculoskeletal: Full ROM all peripheral extremities, joint stability, 5/5 strength, and normal gait. Skin: Warm and dry without rashes, lesions, cyanosis, clubbing or  ecchymosis.  Neuro: Cranial nerves intact, reflexes equal bilaterally. Normal muscle tone, no cerebellar symptoms. Sensation intact.  Pysch: Alert and oriented X 3 with normal affect, insight and judgment appropriate.   Assessment and Plan  1. Annual Preventative/Screening Exam    2. Elevated BP without diagnosis of hypertension  - EKG 12-Lead - [place], RETROPERITNL ABD,  LTD - Urinalysis, Routine w reflex microscopic - DG Chest 2 View; Future - Microalbumin / creatinine urine ratio - CBC with Differential/Platelet - COMPLETE METABOLIC PANEL WITH GFR - Magnesium - TSH  3. Hyperlipidemia, mixed  - EKG 12-Lead - [place], RETROPERITNL ABD,  LTD - Urinalysis, Routine w reflex microscopic - Lipid panel - TSH  4. Abnormal glucose  - EKG 12-Lead - [place], RETROPERITNL ABD,  LTD - Hemoglobin A1c - Insulin, random  5. Screening-pulmonary TB  - TB Skin Test  6. Vitamin D deficiency  - VITAMIN D 25 Hydroxyl  7. Screening for colorectal cancer  - POC Hemoccult Bld/Stl  8. Prostate cancer screening  - PSA  9. Screening for ischemic heart disease  - EKG 12-Lead  10. Screening for AAA (aortic abdominal aneurysm)  - [place], RETROPERITNL ABD,  LTD  11. Fatigue, unspecified type  - Iron,Total/Total Iron Binding Cap - Vitamin B12 - Testosterone - CBC with Differential/Platelet  12. Medication management  - Urinalysis, Routine w reflex microscopic - Microalbumin / creatinine urine ratio - CBC with Differential/Platelet - COMPLETE METABOLIC PANEL WITH GFR - Magnesium - Lipid panel - TSH - Hemoglobin A1c - Insulin, random - VITAMIN D 25 Hydroxyl       Patient was counseled in prudent diet, weight control to achieve/maintain BMI less than 25, BP  monitoring, regular exercise and medications as discussed.  Discussed med effects and SE's. Routine screening labs and tests as requested with regular follow-up as recommended. Over 40 minutes of exam, counseling, chart review and high complex critical decision making was performed   Dave MawWilliam D Rolf Fells, MD

## 2019-02-19 NOTE — Patient Instructions (Signed)

## 2019-02-20 LAB — MAGNESIUM: Magnesium: 2.3 mg/dL (ref 1.5–2.5)

## 2019-02-20 LAB — CBC WITH DIFFERENTIAL/PLATELET
Absolute Monocytes: 270 cells/uL (ref 200–950)
Basophils Absolute: 19 cells/uL (ref 0–200)
Basophils Relative: 0.5 %
Eosinophils Absolute: 70 cells/uL (ref 15–500)
Eosinophils Relative: 1.9 %
HCT: 42.9 % (ref 38.5–50.0)
Hemoglobin: 14.9 g/dL (ref 13.2–17.1)
Lymphs Abs: 932 cells/uL (ref 850–3900)
MCH: 31.2 pg (ref 27.0–33.0)
MCHC: 34.7 g/dL (ref 32.0–36.0)
MCV: 89.9 fL (ref 80.0–100.0)
MPV: 9.7 fL (ref 7.5–12.5)
Monocytes Relative: 7.3 %
Neutro Abs: 2409 cells/uL (ref 1500–7800)
Neutrophils Relative %: 65.1 %
Platelets: 197 10*3/uL (ref 140–400)
RBC: 4.77 10*6/uL (ref 4.20–5.80)
RDW: 12.9 % (ref 11.0–15.0)
Total Lymphocyte: 25.2 %
WBC: 3.7 10*3/uL — ABNORMAL LOW (ref 3.8–10.8)

## 2019-02-20 LAB — URINALYSIS, ROUTINE W REFLEX MICROSCOPIC
Bilirubin Urine: NEGATIVE
Glucose, UA: NEGATIVE
Hgb urine dipstick: NEGATIVE
Ketones, ur: NEGATIVE
Leukocytes,Ua: NEGATIVE
Nitrite: NEGATIVE
Protein, ur: NEGATIVE
Specific Gravity, Urine: 1.004 (ref 1.001–1.03)
pH: 7.5 (ref 5.0–8.0)

## 2019-02-20 LAB — COMPLETE METABOLIC PANEL WITH GFR
AG Ratio: 2 (calc) (ref 1.0–2.5)
ALT: 13 U/L (ref 9–46)
AST: 13 U/L (ref 10–35)
Albumin: 4.5 g/dL (ref 3.6–5.1)
Alkaline phosphatase (APISO): 66 U/L (ref 35–144)
BUN: 13 mg/dL (ref 7–25)
CO2: 25 mmol/L (ref 20–32)
Calcium: 10.1 mg/dL (ref 8.6–10.3)
Chloride: 106 mmol/L (ref 98–110)
Creat: 0.76 mg/dL (ref 0.70–1.33)
GFR, Est African American: 119 mL/min/{1.73_m2} (ref >=60)
GFR, Est Non African American: 103 mL/min/{1.73_m2} (ref >=60)
Globulin: 2.3 g/dL (calc) (ref 1.9–3.7)
Glucose, Bld: 91 mg/dL (ref 65–99)
Potassium: 4.2 mmol/L (ref 3.5–5.3)
Sodium: 138 mmol/L (ref 135–146)
Total Bilirubin: 0.6 mg/dL (ref 0.2–1.2)
Total Protein: 6.8 g/dL (ref 6.1–8.1)

## 2019-02-20 LAB — TSH: TSH: 1.39 mIU/L (ref 0.40–4.50)

## 2019-02-20 LAB — LIPID PANEL
Cholesterol: 163 mg/dL (ref <200)
HDL: 56 mg/dL (ref >=40)
LDL Cholesterol (Calc): 75 mg/dL (calc)
Non-HDL Cholesterol (Calc): 107 mg/dL (calc) (ref <130)
Total CHOL/HDL Ratio: 2.9 (calc) (ref <5.0)
Triglycerides: 227 mg/dL — ABNORMAL HIGH (ref <150)

## 2019-02-20 LAB — INSULIN, RANDOM: Insulin: 3.2 u[IU]/mL

## 2019-02-20 LAB — HEMOGLOBIN A1C
Hgb A1c MFr Bld: 5 % of total Hgb (ref <5.7)
Mean Plasma Glucose: 97 (calc)
eAG (mmol/L): 5.4 (calc)

## 2019-02-20 LAB — IRON, TOTAL/TOTAL IRON BINDING CAP
%SAT: 31 % (calc) (ref 20–48)
Iron: 105 ug/dL (ref 50–180)
TIBC: 341 mcg/dL (calc) (ref 250–425)

## 2019-02-20 LAB — MICROALBUMIN / CREATININE URINE RATIO
Creatinine, Urine: 16 mg/dL — ABNORMAL LOW (ref 20–320)
Microalb, Ur: <0.2 mg/dL

## 2019-02-20 LAB — VITAMIN B12: Vitamin B-12: 416 pg/mL (ref 200–1100)

## 2019-02-20 LAB — PSA: PSA: 2.2 ng/mL (ref <=4.0)

## 2019-02-20 LAB — VITAMIN D 25 HYDROXY (VIT D DEFICIENCY, FRACTURES): Vit D, 25-Hydroxy: 43 ng/mL (ref 30–100)

## 2019-02-20 LAB — TESTOSTERONE: Testosterone: 521 ng/dL (ref 250–827)

## 2019-02-21 LAB — TB SKIN TEST
Induration: 0 mm
TB Skin Test: NEGATIVE

## 2019-05-14 ENCOUNTER — Other Ambulatory Visit: Payer: Self-pay

## 2019-05-14 DIAGNOSIS — Z20822 Contact with and (suspected) exposure to covid-19: Secondary | ICD-10-CM

## 2019-05-15 LAB — NOVEL CORONAVIRUS, NAA: SARS-CoV-2, NAA: NOT DETECTED

## 2019-05-24 NOTE — Progress Notes (Signed)
FOLLOW UP  Assessment and Plan:   Cholesterol At LDL goal <100 on rosuvastatin 20 mg every other day  Trigs remain modestly elevated; historically with very mild elevations only on non-fasting labs, suggested addition of omega 3 supplement/fish oil or fenofibrate if trending up Continue low cholesterol diet and exercise.  Check lipid panel.   Overweight Long discussion about weight loss, diet, and exercise Recommended diet heavy in fruits and veggies and low in animal meats, cheeses, and dairy products, appropriate calorie intake Discussed ideal weight for height  Will follow up in 3 months  Vitamin D Def He has been taking more consistently  Defer Vit D level to next OV  Continue diet and meds as discussed. Further disposition pending results of labs. Discussed med's effects and SE's.   Over 30 minutes of exam, counseling, chart review, and critical decision making was performed.   Future Appointments  Date Time Provider Department Center  09/02/2019  3:30 PM Lucky Cowboy, MD [facility] None  03/30/2020  3:00 PM Lucky Cowboy, MD [facility] None    ----------------------------------------------------------------------------------------------------------------------  HPI 58 y.o. male  presents for 3 month follow up on cholesterol, weight and vitamin D deficiency.   BMI is Body mass index is 26.5 kg/m., he has been working on diet and exercise, he is avoiding red meat. He is limiting processed food as well. He walks every other day, lifts weight, back in the gym since last month.  Wt Readings from Last 3 Encounters:  05/27/19 190 lb (86.2 kg)  02/19/19 186 lb 9.6 oz (84.6 kg)  06/25/18 191 lb (86.6 kg)   Today their BP is BP: 110/62  He does workout. He denies chest pain, shortness of breath, dizziness.   He is on cholesterol medication Rosuvastatin 20 mg every other day and denies myalgias. His LDL cholesterol is at goal. Trigs remain moderately elevated. The  cholesterol last visit was:   Lab Results  Component Value Date   CHOL 163 02/19/2019   HDL 56 02/19/2019   LDLCALC 75 02/19/2019   TRIG 227 (H) 02/19/2019   CHOLHDL 2.9 02/19/2019   Patient is on Vitamin D supplement,   Lab Results  Component Value Date   VD25OH 43 02/19/2019       Current Medications:  Current Outpatient Medications on File Prior to Visit  Medication Sig  . Cholecalciferol (VITAMIN D3 PO) Take 10,000 Units by mouth daily.   . rosuvastatin (CRESTOR) 40 MG tablet Take 1/2 to 1 tablet daily or as directed for Cholesterol   No current facility-administered medications on file prior to visit.      Allergies: No Known Allergies   Medical History:  Past Medical History:  Diagnosis Date  . Hyperlipidemia    Family history- Reviewed and unchanged Social history- Reviewed and unchanged   Review of Systems:  Review of Systems  Constitutional: Negative for malaise/fatigue and weight loss.  HENT: Negative for hearing loss and tinnitus.   Eyes: Negative for blurred vision and double vision.  Respiratory: Negative for cough, shortness of breath and wheezing.   Cardiovascular: Negative for chest pain, palpitations, orthopnea, claudication and leg swelling.  Gastrointestinal: Negative for abdominal pain, blood in stool, constipation, diarrhea, heartburn, melena, nausea and vomiting.  Genitourinary: Negative.   Musculoskeletal: Negative for joint pain and myalgias.  Skin: Negative for rash.  Neurological: Negative for dizziness, tingling, sensory change, weakness and headaches.  Endo/Heme/Allergies: Negative for polydipsia.  Psychiatric/Behavioral: Negative.   All other systems reviewed and are negative.  Physical Exam: BP 110/62   Pulse (!) 55   Temp 97.7 F (36.5 C)   Ht 5\' 11"  (1.803 m)   Wt 190 lb (86.2 kg)   SpO2 98%   BMI 26.50 kg/m  Wt Readings from Last 3 Encounters:  05/27/19 190 lb (86.2 kg)  02/19/19 186 lb 9.6 oz (84.6 kg)  06/25/18  191 lb (86.6 kg)   General Appearance: Well nourished, in no apparent distress. Eyes: PERRLA, conjunctiva no swelling or erythema Sinuses: No Frontal/maxillary tenderness ENT/Mouth: Ext aud canals clear, TMs without erythema, bulging. No erythema, swelling, or exudate on post pharynx.  Tonsils not swollen or erythematous. Hearing normal.  Neck: Supple, thyroid normal.  Respiratory: Respiratory effort normal, BS equal bilaterally without rales, rhonchi, wheezing or stridor.  Cardio: RRR with no MRGs. Brisk peripheral pulses without edema.  Abdomen: Soft, + BS.  Non tender, no guarding, rebound, hernias, masses. Lymphatics: Non tender without lymphadenopathy.  Musculoskeletal: Full ROM, 5/5 strength, Normal gait Skin: Warm, dry without rashes, lesions, ecchymosis.  Neuro: Cranial nerves intact. No cerebellar symptoms.  Psych: Awake and oriented X 3, normal affect, Insight and Judgment appropriate.    Izora Ribas, NP 4:17 PM [facility]

## 2019-05-27 ENCOUNTER — Other Ambulatory Visit: Payer: Self-pay

## 2019-05-27 ENCOUNTER — Encounter: Payer: Self-pay | Admitting: Adult Health

## 2019-05-27 ENCOUNTER — Ambulatory Visit (INDEPENDENT_AMBULATORY_CARE_PROVIDER_SITE_OTHER): Payer: No Typology Code available for payment source | Admitting: Adult Health

## 2019-05-27 VITALS — BP 110/62 | HR 55 | Temp 97.7°F | Ht 71.0 in | Wt 190.0 lb

## 2019-05-27 DIAGNOSIS — E782 Mixed hyperlipidemia: Secondary | ICD-10-CM | POA: Diagnosis not present

## 2019-05-27 DIAGNOSIS — Z6826 Body mass index (BMI) 26.0-26.9, adult: Secondary | ICD-10-CM

## 2019-05-27 DIAGNOSIS — E559 Vitamin D deficiency, unspecified: Secondary | ICD-10-CM | POA: Diagnosis not present

## 2019-05-27 NOTE — Patient Instructions (Signed)
Goals    . LDL CALC < 100       On review, it appears mildly elevated triglycerides are most likely due to non-fasting labs, but will attach lifestyle interventions to review and be aware of   This test is very sensitive and may respond to particularly fatty foods consumed within 72 hours of the test     High Triglycerides Eating Plan Triglycerides are a type of fat in the blood. High levels of triglycerides can increase your risk of heart disease and stroke. If your triglyceride levels are high, choosing the right foods can help lower your triglycerides and keep your heart healthy. Work with your health care provider or a diet and nutrition specialist (dietitian) to develop an eating plan that is right for you. What are tips for following this plan? General guidelines   Lose weight, if you are overweight. For most people, losing 5-10 lbs (2-5 kg) helps lower triglyceride levels. A weight-loss plan may include. ? 30 minutes of exercise at least 5 days a week. ? Reducing the amount of calories, sugar, and fat you eat.  Eat a wide variety of fresh fruits, vegetables, and whole grains. These foods are high in fiber.  Eat foods that contain healthy fats, such as fatty fish, nuts, seeds, and olive oil.  Avoid foods that are high in added sugar, added salt (sodium), saturated fat, and trans fat.  Avoid low-fiber, refined carbohydrates such as white bread, crackers, noodles, and white rice.  Avoid foods with partially hydrogenated oils (trans fats), such as fried foods or stick margarine.  Limit alcohol intake to no more than 1 drink a day for nonpregnant women and 2 drinks a day for men. One drink equals 12 oz of beer, 5 oz of wine, or 1 oz of hard liquor. Your health care provider may recommend that you drink less depending on your overall health. Reading food labels  Check food labels for the amount of saturated fat. Choose foods with no or very little saturated fat.  Check food  labels for the amount of trans fat. Choose foods with no trans fat.  Check food labels for the amount of cholesterol. Choose foods low in cholesterol. Ask your dietitian how much cholesterol you should have each day.  Check food labels for the amount of sodium. Choose foods with less than 140 milligrams (mg) per serving. Shopping  Buy dairy products labeled as nonfat (skim) or low-fat (1%).  Avoid buying processed or prepackaged foods. These are often high in added sugar, sodium, and fat. Cooking  Choose healthy fats when cooking, such as olive oil or canola oil.  Cook foods using lower fat methods, such as baking, broiling, boiling, or grilling.  Make your own sauces, dressings, and marinades when possible, instead of buying them. Store-bought sauces, dressings, and marinades are often high in sodium and sugar. Meal planning  Eat more home-cooked food and less restaurant, buffet, and fast food.  Eat fatty fish at least 2 times each week. Examples of fatty fish include salmon, trout, mackerel, tuna, and herring.  If you eat whole eggs, do not eat more than 3 egg yolks per week. What foods are recommended? The items listed may not be a complete list. Talk with your dietitian about what dietary choices are best for you. Grains Whole wheat or whole grain breads, crackers, cereals, and pasta. Unsweetened oatmeal. Bulgur. Barley. Quinoa. Brown rice. Whole wheat flour tortillas. Vegetables Fresh or frozen vegetables. Low-sodium canned vegetables. Fruits All fresh,  canned (in natural juice), or frozen fruits. Meats and other protein foods Skinless chicken or [place]. Ground chicken or [place]. Lean cuts of pork, trimmed of fat. Fish and seafood, especially salmon, trout, and herring. Egg whites. Dried beans, peas, or lentils. Unsalted nuts or seeds. Unsalted canned beans. Natural peanut or almond butter. Dairy Low-fat dairy products. Skim or low-fat (1%) milk. Reduced fat (2%) and  low-sodium cheese. Low-fat ricotta cheese. Low-fat cottage cheese. Plain, low-fat yogurt. Fats and oils Tub margarine without trans fats. Light or reduced-fat mayonnaise. Light or reduced-fat salad dressings. Avocado. Safflower, olive, sunflower, soybean, and canola oils. What foods are not recommended? The items listed may not be a complete list. Talk with your dietitian about what dietary choices are best for you. Grains White bread. White (regular) pasta. White rice. Cornbread. Bagels. Pastries. Crackers that contain trans fat. Vegetables Creamed or fried vegetables. Vegetables in a cheese sauce. Fruits Sweetened dried fruit. Canned fruit in syrup. Fruit juice. Meats and other protein foods Fatty cuts of meat. Ribs. Chicken wings. Berniece Salines. Sausage. Bologna. Salami. Chitterlings. Fatback. Hot dogs. Bratwurst. Packaged lunch meats. Dairy Whole or reduced-fat (2%) milk. Half-and-half. Cream cheese. Full-fat or sweetened yogurt. Full-fat cheese. Nondairy creamers. Whipped toppings. Processed cheese or cheese spreads. Cheese curds. Beverages Alcohol. Sweetened drinks, such as soda, lemonade, fruit drinks, or punches. Fats and oils Butter. Stick margarine. Lard. Shortening. Ghee. Bacon fat. Tropical oils, such as coconut, palm kernel, or palm oils. Sweets and desserts Corn syrup. Sugars. Honey. Molasses. Candy. Jam and jelly. Syrup. Sweetened cereals. Cookies. Pies. Cakes. Donuts. Muffins. Ice cream. Condiments Store-bought sauces, dressings, and marinades that are high in sugar, such as ketchup and barbecue sauce. Summary  High levels of triglycerides can increase the risk of heart disease and stroke. Choosing the right foods can help lower your triglycerides.  Eat plenty of fresh fruits, vegetables, and whole grains. Choose low-fat dairy and lean meats. Eat fatty fish at least twice a week.  Avoid processed and prepackaged foods with added sugar, sodium, saturated fat, and trans fat.  If  you need suggestions or have questions about what types of food are good for you, talk with your health care provider or a dietitian. This information is not intended to replace advice given to you by your health care provider. Make sure you discuss any questions you have with your health care provider. Document Released: 04/21/2004 Document Revised: 06/16/2017 Document Reviewed: 09/06/2016 Elsevier Patient Education  2020 [employer].

## 2019-05-28 LAB — COMPLETE METABOLIC PANEL WITH GFR
AG Ratio: 2.3 (calc) (ref 1.0–2.5)
ALT: 19 U/L (ref 9–46)
AST: 15 U/L (ref 10–35)
Albumin: 4.4 g/dL (ref 3.6–5.1)
Alkaline phosphatase (APISO): 61 U/L (ref 35–144)
BUN: 13 mg/dL (ref 7–25)
CO2: 28 mmol/L (ref 20–32)
Calcium: 10.4 mg/dL — ABNORMAL HIGH (ref 8.6–10.3)
Chloride: 105 mmol/L (ref 98–110)
Creat: 0.85 mg/dL (ref 0.70–1.33)
GFR, Est African American: 113 mL/min/{1.73_m2} (ref >=60)
GFR, Est Non African American: 97 mL/min/{1.73_m2} (ref >=60)
Globulin: 1.9 g/dL (calc) (ref 1.9–3.7)
Glucose, Bld: 93 mg/dL (ref 65–99)
Potassium: 4.3 mmol/L (ref 3.5–5.3)
Sodium: 139 mmol/L (ref 135–146)
Total Bilirubin: 0.6 mg/dL (ref 0.2–1.2)
Total Protein: 6.3 g/dL (ref 6.1–8.1)

## 2019-05-28 LAB — LIPID PANEL
Cholesterol: 146 mg/dL (ref <200)
HDL: 59 mg/dL (ref >=40)
LDL Cholesterol (Calc): 64 mg/dL (calc)
Non-HDL Cholesterol (Calc): 87 mg/dL (calc) (ref <130)
Total CHOL/HDL Ratio: 2.5 (calc) (ref <5.0)
Triglycerides: 157 mg/dL — ABNORMAL HIGH (ref <150)

## 2019-06-27 ENCOUNTER — Other Ambulatory Visit: Payer: Self-pay | Admitting: Orthopedic Surgery

## 2019-06-27 DIAGNOSIS — M19011 Primary osteoarthritis, right shoulder: Secondary | ICD-10-CM

## 2019-07-01 ENCOUNTER — Other Ambulatory Visit: Payer: Self-pay | Admitting: Orthopedic Surgery

## 2019-07-05 ENCOUNTER — Ambulatory Visit
Admission: RE | Admit: 2019-07-05 | Discharge: 2019-07-05 | Disposition: A | Discharge status: Home / Self Care | Payer: No Typology Code available for payment source | Source: Ambulatory Visit | Attending: Orthopedic Surgery | Admitting: Orthopedic Surgery

## 2019-07-05 ENCOUNTER — Other Ambulatory Visit: Payer: Self-pay

## 2019-07-05 DIAGNOSIS — M19011 Primary osteoarthritis, right shoulder: Secondary | ICD-10-CM

## 2019-07-26 NOTE — Patient Instructions (Signed)
DUE TO COVID-19 ONLY ONE VISITOR IS ALLOWED TO COME WITH YOU AND STAY IN THE WAITING ROOM ONLY DURING PRE OP AND PROCEDURE DAY OF SURGERY. THE 1 VISITOR MAY VISIT WITH YOU AFTER SURGERY IN YOUR PRIVATE ROOM DURING VISITING HOURS ONLY!  YOU NEED TO HAVE A COVID 19 TEST ON: 07/29/19 @  9:25 am , THIS TEST MUST BE DONE BEFORE SURGERY, COME  [address], [place] , [id number].  [facility]) ONCE YOUR COVID TEST IS COMPLETED, PLEASE BEGIN THE QUARANTINE INSTRUCTIONS AS OUTLINED IN YOUR HANDOUT.                Dave Rogers    Your procedure is scheduled on: 08/01/19   Report to [facility] Main  Entrance   Report to admitting at: 8:30 AM     Call this number if you have problems the morning of surgery [phone]    Remember:   BRUSH YOUR TEETH MORNING OF SURGERY AND RINSE YOUR MOUTH OUT, NO CHEWING GUM CANDY OR MINTS.     Take these medicines the morning of surgery with A SIP OF WATER: Rosuvastatin              You may not have any metal on your body including hair pins and              piercings  Do not wear jewelry, lotions, powders or perfumes, deodoran             Men may shave face and neck.     Do not bring valuables to the hospital. [facility] IS NOT             RESPONSIBLE   FOR VALUABLES.  Contacts, dentures or bridgework may not be worn into surgery.  Leave suitcase in the car. After surgery it may be brought to your room.     Patients discharged the day of surgery will not be allowed to drive home. IF YOU ARE HAVING SURGERY AND GOING HOME THE SAME DAY, YOU MUST HAVE AN ADULT TO DRIVE YOU HOME AND  BE WITH YOU FOR 24 HOURS. YOU MAY GO HOME BY TAXI OR UBER OR ORTHERWISE, BUT AN ADULT MUST ACCOMPANY YOU HOME AND STAY WITH YOU FOR 24 HOURS.  Name and phone number of your driver:  Special Instructions: N/A              Please read over the following fact sheets you were  given: _____________________________________________________________________             NO SOLID FOOD AFTER MIDNIGHT THE NIGHT PRIOR TO SURGERY. NOTHING BY MOUTH EXCEPT CLEAR LIQUIDS UNTIL: 8:00 am. PLEASE FINISH ENSURE DRINK PER SURGEON ORDER  WHICH NEEDS TO BE COMPLETED AT: 8:00 am .   CLEAR LIQUID DIET   Foods Allowed                                                                     Foods Excluded  Coffee and tea, regular and decaf                             liquids that you cannot  Plain Jell-O any favor  except red or purple                                           see through such as: Fruit ices (not with fruit pulp)                                     milk, soups, orange juice  Iced Popsicles                                    All solid food Carbonated beverages, regular and diet                                    Cranberry, grape and apple juices Sports drinks like Gatorade Lightly seasoned clear broth or consume(fat free) Sugar, honey syrup  Sample Menu Breakfast                                Lunch                                     Supper Cranberry juice                    Beef broth                            Chicken broth Jell-O                                     Grape juice                           Apple juice Coffee or tea                        Jell-O                                      Popsicle                                                Coffee or tea                        Coffee or tea  _____________________________________________________________________  [facility] - Preparing for Surgery Before surgery, you can play an important role.  Because skin is not sterile, your skin needs to be as free of germs as possible.  You can reduce the number of germs on your skin by washing with CHG (chlorahexidine gluconate) soap before surgery.  CHG is an antiseptic cleaner which kills germs and bonds with the skin to continue killing  germs even after  washing. Please DO NOT use if you have an allergy to CHG or antibacterial soaps.  If your skin becomes reddened/irritated stop using the CHG and inform your nurse when you arrive at Short Stay. Do not shave (including legs and underarms) for at least 48 hours prior to the first CHG shower.  You may shave your face/neck. Please follow these instructions carefully:  1.  Shower with CHG Soap the night before surgery and the  morning of Surgery.  2.  If you choose to wash your hair, wash your hair first as usual with your  normal  shampoo.  3.  After you shampoo, rinse your hair and body thoroughly to remove the  shampoo.                           4.  Use CHG as you would any other liquid soap.  You can apply chg directly  to the skin and wash                       Gently with a scrungie or clean washcloth.  5.  Apply the CHG Soap to your body ONLY FROM THE NECK DOWN.   Do not use on face/ open                           Wound or open sores. Avoid contact with eyes, ears mouth and genitals (private parts).                       Wash face,  Genitals (private parts) with your normal soap.             6.  Wash thoroughly, paying special attention to the area where your surgery  will be performed.  7.  Thoroughly rinse your body with warm water from the neck down.  8.  DO NOT shower/wash with your normal soap after using and rinsing off  the CHG Soap.                9.  Pat yourself dry with a clean towel.            10.  Wear clean pajamas.            11.  Place clean sheets on your bed the night of your first shower and do not  sleep with pets. Day of Surgery : Do not apply any lotions/deodorants the morning of surgery.  Please wear clean clothes to the hospital/surgery center.  FAILURE TO FOLLOW THESE INSTRUCTIONS MAY RESULT IN THE CANCELLATION OF YOUR SURGERY PATIENT SIGNATURE_________________________________  NURSE  SIGNATURE__________________________________  ________________________________________________________________________   Adam Phenix  An incentive spirometer is a tool that can help keep your lungs clear and active. This tool measures how well you are filling your lungs with each breath. Taking long deep breaths may help reverse or decrease the chance of developing breathing (pulmonary) problems (especially infection) following:  A long period of time when you are unable to move or be active. BEFORE THE PROCEDURE   If the spirometer includes an indicator to show your best effort, your nurse or respiratory therapist will set it to a desired goal.  If possible, sit up straight or lean slightly forward. Try not to slouch.  Hold the incentive spirometer in an upright position. INSTRUCTIONS FOR USE  1. Sit on  the edge of your bed if possible, or sit up as far as you can in bed or on a chair. 2. Hold the incentive spirometer in an upright position. 3. Breathe out normally. 4. Place the mouthpiece in your mouth and seal your lips tightly around it. 5. Breathe in slowly and as deeply as possible, raising the piston or the ball toward the top of the column. 6. Hold your breath for 3-5 seconds or for as long as possible. Allow the piston or ball to fall to the bottom of the column. 7. Remove the mouthpiece from your mouth and breathe out normally. 8. Rest for a few seconds and repeat Steps 1 through 7 at least 10 times every 1-2 hours when you are awake. Take your time and take a few normal breaths between deep breaths. 9. The spirometer may include an indicator to show your best effort. Use the indicator as a goal to work toward during each repetition. 10. After each set of 10 deep breaths, practice coughing to be sure your lungs are clear. If you have an incision (the cut made at the time of surgery), support your incision when coughing by placing a pillow or rolled up towels firmly  against it. Once you are able to get out of bed, walk around indoors and cough well. You may stop using the incentive spirometer when instructed by your caregiver.  RISKS AND COMPLICATIONS  Take your time so you do not get dizzy or light-headed.  If you are in pain, you may need to take or ask for pain medication before doing incentive spirometry. It is harder to take a deep breath if you are having pain. AFTER USE  Rest and breathe slowly and easily.  It can be helpful to keep track of a log of your progress. Your caregiver can provide you with a simple table to help with this. If you are using the spirometer at home, follow these instructions: [place] IF:   You are having difficultly using the spirometer.  You have trouble using the spirometer as often as instructed.  Your pain medication is not giving enough relief while using the spirometer.  You develop fever of 100.5 F (38.1 C) or higher. SEEK IMMEDIATE MEDICAL CARE IF:   You cough up bloody sputum that had not been present before.  You develop fever of 102 F (38.9 C) or greater.  You develop worsening pain at or near the incision site. MAKE SURE YOU:   Understand these instructions.  Will watch your condition.  Will get help right away if you are not doing well or get worse. Document Released: 11/14/2006 Document Revised: 09/26/2011 Document Reviewed: 01/15/2007 [facility] Patient Information 2014 [place], [place].   ________________________________________________________________________

## 2019-07-29 ENCOUNTER — Encounter (HOSPITAL_COMMUNITY)
Admission: RE | Admit: 2019-07-29 | Discharge: 2019-07-29 | Disposition: A | Discharge status: Home / Self Care | Payer: PRIVATE HEALTH INSURANCE | Source: Ambulatory Visit | Attending: Orthopedic Surgery | Admitting: Orthopedic Surgery

## 2019-07-29 ENCOUNTER — Ambulatory Visit (HOSPITAL_COMMUNITY)
Admission: RE | Admit: 2019-07-29 | Discharge: 2019-07-29 | Disposition: A | Discharge status: Home / Self Care | Payer: PRIVATE HEALTH INSURANCE | Source: Ambulatory Visit | Attending: Orthopedic Surgery | Admitting: Orthopedic Surgery

## 2019-07-29 ENCOUNTER — Other Ambulatory Visit (HOSPITAL_COMMUNITY)
Admission: RE | Admit: 2019-07-29 | Discharge: 2019-07-29 | Disposition: A | Discharge status: Home / Self Care | Payer: PRIVATE HEALTH INSURANCE | Source: Ambulatory Visit

## 2019-07-29 ENCOUNTER — Encounter (HOSPITAL_COMMUNITY): Payer: Self-pay

## 2019-07-29 ENCOUNTER — Other Ambulatory Visit: Payer: Self-pay

## 2019-07-29 ENCOUNTER — Other Ambulatory Visit (HOSPITAL_COMMUNITY): Payer: No Typology Code available for payment source

## 2019-07-29 DIAGNOSIS — Z01811 Encounter for preprocedural respiratory examination: Secondary | ICD-10-CM

## 2019-07-29 HISTORY — DX: Unspecified osteoarthritis, unspecified site: M19.90

## 2019-07-29 LAB — COMPREHENSIVE METABOLIC PANEL
ALT: 24 U/L (ref 0–44)
AST: 19 U/L (ref 15–41)
Albumin: 4.5 g/dL (ref 3.5–5.0)
Alkaline Phosphatase: 72 U/L (ref 38–126)
Anion gap: 7 (ref 5–15)
BUN: 16 mg/dL (ref 6–20)
CO2: 26 mmol/L (ref 22–32)
Calcium: 10.3 mg/dL (ref 8.9–10.3)
Chloride: 108 mmol/L (ref 98–111)
Creatinine, Ser: 0.74 mg/dL (ref 0.61–1.24)
GFR calc Af Amer: >60 mL/min (ref >60)
GFR calc non Af Amer: >60 mL/min (ref >60)
Glucose, Bld: 100 mg/dL — ABNORMAL HIGH (ref 70–99)
Potassium: 4.4 mmol/L (ref 3.5–5.1)
Sodium: 141 mmol/L (ref 135–145)
Total Bilirubin: 0.7 mg/dL (ref 0.3–1.2)
Total Protein: 7.1 g/dL (ref 6.5–8.1)

## 2019-07-29 LAB — URINALYSIS, ROUTINE W REFLEX MICROSCOPIC
Bilirubin Urine: NEGATIVE
Glucose, UA: NEGATIVE mg/dL
Hgb urine dipstick: NEGATIVE
Ketones, ur: NEGATIVE mg/dL
Leukocytes,Ua: NEGATIVE
Nitrite: NEGATIVE
Protein, ur: NEGATIVE mg/dL
Specific Gravity, Urine: 1.002 — ABNORMAL LOW (ref 1.005–1.030)
pH: 6 (ref 5.0–8.0)

## 2019-07-29 LAB — CBC WITH DIFFERENTIAL/PLATELET
Abs Immature Granulocytes: 0.01 10*3/uL (ref 0.00–0.07)
Basophils Absolute: 0 10*3/uL (ref 0.0–0.1)
Basophils Relative: 1 %
Eosinophils Absolute: 0 10*3/uL (ref 0.0–0.5)
Eosinophils Relative: 1 %
HCT: 43.7 % (ref 39.0–52.0)
Hemoglobin: 14.8 g/dL (ref 13.0–17.0)
Immature Granulocytes: 0 %
Lymphocytes Relative: 26 %
Lymphs Abs: 0.9 10*3/uL (ref 0.7–4.0)
MCH: 31.2 pg (ref 26.0–34.0)
MCHC: 33.9 g/dL (ref 30.0–36.0)
MCV: 92.2 fL (ref 80.0–100.0)
Monocytes Absolute: 0.3 10*3/uL (ref 0.1–1.0)
Monocytes Relative: 8 %
Neutro Abs: 2.2 10*3/uL (ref 1.7–7.7)
Neutrophils Relative %: 64 %
Platelets: 183 10*3/uL (ref 150–400)
RBC: 4.74 MIL/uL (ref 4.22–5.81)
RDW: 12.8 % (ref 11.5–15.5)
WBC: 3.4 10*3/uL — ABNORMAL LOW (ref 4.0–10.5)
nRBC: 0 % (ref 0.0–0.2)

## 2019-07-29 LAB — APTT: aPTT: 30 seconds (ref 24–36)

## 2019-07-29 LAB — SURGICAL PCR SCREEN
MRSA, PCR: NEGATIVE
Staphylococcus aureus: NEGATIVE

## 2019-07-29 LAB — PROTIME-INR
INR: 1 (ref 0.8–1.2)
Prothrombin Time: 12.7 seconds (ref 11.4–15.2)

## 2019-07-29 LAB — ABO/RH: ABO/RH(D): O POS

## 2019-07-29 NOTE — Progress Notes (Addendum)
PCP -Lucky Cowboy . LOV 02/19/19.  Judd Gaudier: NP: 05/27/19. EPIC Cardiologist -   Chest x-ray -  EKG - 02/19/19. EPIC Stress Test -  ECHO -  Cardiac Cath -   Sleep Study -  CPAP -   Fasting Blood Sugar -  Checks Blood Sugar _____ times a day  Blood Thinner Instructions: Aspirin Instructions: Last Dose:  Anesthesia review:   Patient denies shortness of breath, fever, cough and chest pain at PAT appointment   Patient verbalized understanding of instructions that were given to them at the PAT appointment. Patient was also instructed that they will need to review over the PAT instructions again at home before surgery.

## 2019-07-30 ENCOUNTER — Other Ambulatory Visit: Payer: Self-pay | Admitting: Orthopedic Surgery

## 2019-07-30 ENCOUNTER — Ambulatory Visit
Admission: RE | Admit: 2019-07-30 | Discharge: 2019-07-30 | Disposition: A | Discharge status: Home / Self Care | Payer: No Typology Code available for payment source | Source: Ambulatory Visit | Attending: Orthopedic Surgery | Admitting: Orthopedic Surgery

## 2019-07-30 ENCOUNTER — Other Ambulatory Visit: Payer: No Typology Code available for payment source

## 2019-07-30 DIAGNOSIS — R911 Solitary pulmonary nodule: Secondary | ICD-10-CM

## 2019-07-30 LAB — NOVEL CORONAVIRUS, NAA (HOSP ORDER, SEND-OUT TO REF LAB; TAT 18-24 HRS): SARS-CoV-2, NAA: NOT DETECTED

## 2019-07-30 MED ORDER — IOPAMIDOL (ISOVUE-300) INJECTION 61%
75.0000 mL | Freq: Once | INTRAVENOUS | Status: AC | PRN
  Administered 2019-07-30: 75 mL via INTRAVENOUS

## 2019-07-31 NOTE — Anesthesia Preprocedure Evaluation (Addendum)
Anesthesia Evaluation  Patient identified by MRN, date of birth, ID band Patient awake    Reviewed: Allergy & Precautions, NPO status , Patient's Chart, lab work & pertinent test results  Airway Mallampati: II  TM Distance: >3 FB Neck ROM: Full    Dental no notable dental hx. (+) Teeth Intact, Dental Advisory Given   Pulmonary neg pulmonary ROS,    Pulmonary exam normal breath sounds clear to auscultation       Cardiovascular Exercise Tolerance: Good negative cardio ROS Normal cardiovascular exam Rhythm:Regular Rate:Normal  07-29-19 EKG- SB R 50 w NSST changes   Neuro/Psych negative neurological ROS     GI/Hepatic Neg liver ROS,   Endo/Other  negative endocrine ROS  Renal/GU K+ 4.4 Cr 0.74     Musculoskeletal   Abdominal   Peds  Hematology Hgb 14.8   Anesthesia Other Findings   Reproductive/Obstetrics                            Anesthesia Physical Anesthesia Plan  ASA: II  Anesthesia Plan: General   Post-op Pain Management:  Regional for Post-op pain   Induction: Intravenous  PONV Risk Score and Plan: Treatment may vary due to age or medical condition, Dexamethasone, Ondansetron and Midazolam  Airway Management Planned: Oral ETT  Additional Equipment: None  Intra-op Plan:   Post-operative Plan: Extubation in OR  Informed Consent: I have reviewed the patients History and Physical, chart, labs and discussed the procedure including the risks, benefits and alternatives for the proposed anesthesia with the patient or authorized representative who has indicated his/her understanding and acceptance.     Dental advisory given  Plan Discussed with: CRNA  Anesthesia Plan Comments: (GA w exparel R ISB)       Anesthesia Quick Evaluation

## 2019-08-01 ENCOUNTER — Other Ambulatory Visit: Payer: Self-pay | Admitting: Orthopedic Surgery

## 2019-08-01 ENCOUNTER — Inpatient Hospital Stay (HOSPITAL_COMMUNITY): Payer: PRIVATE HEALTH INSURANCE | Admitting: Physician Assistant

## 2019-08-01 ENCOUNTER — Encounter (HOSPITAL_COMMUNITY): Payer: Self-pay | Admitting: Orthopedic Surgery

## 2019-08-01 ENCOUNTER — Encounter (HOSPITAL_COMMUNITY)
Admission: RE | Disposition: A | Discharge status: Home / Self Care | Payer: Self-pay | Source: Other Acute Inpatient Hospital | Attending: Orthopedic Surgery

## 2019-08-01 ENCOUNTER — Inpatient Hospital Stay (HOSPITAL_COMMUNITY)
Admission: RE | Admit: 2019-08-01 | Discharge: 2019-08-01 | DRG: 483 | Disposition: A | Discharge status: Home / Self Care | Payer: PRIVATE HEALTH INSURANCE | Attending: Orthopedic Surgery | Admitting: Orthopedic Surgery

## 2019-08-01 ENCOUNTER — Inpatient Hospital Stay (HOSPITAL_COMMUNITY): Payer: PRIVATE HEALTH INSURANCE

## 2019-08-01 DIAGNOSIS — M19011 Primary osteoarthritis, right shoulder: Principal | ICD-10-CM | POA: Diagnosis present

## 2019-08-01 DIAGNOSIS — M25711 Osteophyte, right shoulder: Secondary | ICD-10-CM | POA: Diagnosis present

## 2019-08-01 DIAGNOSIS — Z79899 Other long term (current) drug therapy: Secondary | ICD-10-CM | POA: Diagnosis not present

## 2019-08-01 DIAGNOSIS — Z20822 Contact with and (suspected) exposure to covid-19: Secondary | ICD-10-CM | POA: Diagnosis present

## 2019-08-01 DIAGNOSIS — Z96611 Presence of right artificial shoulder joint: Secondary | ICD-10-CM

## 2019-08-01 DIAGNOSIS — E785 Hyperlipidemia, unspecified: Secondary | ICD-10-CM | POA: Diagnosis present

## 2019-08-01 HISTORY — PX: TOTAL SHOULDER ARTHROPLASTY: SHX126

## 2019-08-01 LAB — TYPE AND SCREEN
ABO/RH(D): O POS
Antibody Screen: NEGATIVE

## 2019-08-01 SURGERY — ARTHROPLASTY, SHOULDER, TOTAL
Anesthesia: General | Site: Shoulder | Laterality: Right

## 2019-08-01 MED ORDER — PHENYLEPHRINE 40 MCG/ML (10ML) SYRINGE FOR IV PUSH (FOR BLOOD PRESSURE SUPPORT)
PREFILLED_SYRINGE | INTRAVENOUS | Status: AC
  Filled 2019-08-01: qty 10

## 2019-08-01 MED ORDER — SUCCINYLCHOLINE CHLORIDE 200 MG/10ML IV SOSY
PREFILLED_SYRINGE | INTRAVENOUS | Status: AC
  Filled 2019-08-01: qty 20

## 2019-08-01 MED ORDER — ACETAMINOPHEN 10 MG/ML IV SOLN
1000.0000 mg | Freq: Once | INTRAVENOUS | Status: DC | PRN
  Administered 2019-08-01: 1000 mg via INTRAVENOUS

## 2019-08-01 MED ORDER — LIDOCAINE 2% (20 MG/ML) 5 ML SYRINGE
INTRAMUSCULAR | Status: DC | PRN
  Administered 2019-08-01: 100 mg via INTRAVENOUS

## 2019-08-01 MED ORDER — MEPERIDINE HCL 50 MG/ML IJ SOLN: 6.2500 mg | INTRAMUSCULAR | Status: DC | PRN

## 2019-08-01 MED ORDER — ONDANSETRON HCL 4 MG/2ML IJ SOLN
INTRAMUSCULAR | Status: AC
  Filled 2019-08-01: qty 4

## 2019-08-01 MED ORDER — LACTATED RINGERS IV SOLN: INTRAVENOUS | Status: DC

## 2019-08-01 MED ORDER — ACETAMINOPHEN 10 MG/ML IV SOLN
INTRAVENOUS | Status: AC
  Filled 2019-08-01: qty 100

## 2019-08-01 MED ORDER — LIDOCAINE 2% (20 MG/ML) 5 ML SYRINGE
INTRAMUSCULAR | Status: AC
  Filled 2019-08-01: qty 10

## 2019-08-01 MED ORDER — ROCURONIUM BROMIDE 10 MG/ML (PF) SYRINGE
PREFILLED_SYRINGE | INTRAVENOUS | Status: AC
  Filled 2019-08-01: qty 20

## 2019-08-01 MED ORDER — PROMETHAZINE HCL 25 MG/ML IJ SOLN: 6.2500 mg | INTRAMUSCULAR | Status: DC | PRN

## 2019-08-01 MED ORDER — STERILE WATER FOR IRRIGATION IR SOLN
Status: DC | PRN
  Administered 2019-08-01: 1000 mL

## 2019-08-01 MED ORDER — BUPIVACAINE HCL (PF) 0.5 % IJ SOLN
INTRAMUSCULAR | Status: DC | PRN
  Administered 2019-08-01: 70 mg via PERINEURAL

## 2019-08-01 MED ORDER — SODIUM CHLORIDE 0.9 % IV SOLN
INTRAVENOUS | Status: DC | PRN
  Administered 2019-08-01: 1000 mL

## 2019-08-01 MED ORDER — FENTANYL CITRATE (PF) 250 MCG/5ML IJ SOLN
INTRAMUSCULAR | Status: AC
  Filled 2019-08-01: qty 5

## 2019-08-01 MED ORDER — TRANEXAMIC ACID-NACL 1000-0.7 MG/100ML-% IV SOLN
1000.0000 mg | INTRAVENOUS | Status: AC
  Administered 2019-08-01: 1000 mg via INTRAVENOUS
  Filled 2019-08-01: qty 100

## 2019-08-01 MED ORDER — BUPIVACAINE LIPOSOME 1.3 % IJ SUSP
INTRAMUSCULAR | Status: DC | PRN
  Administered 2019-08-01: 133 mg via PERINEURAL

## 2019-08-01 MED ORDER — HYDROMORPHONE HCL 1 MG/ML IJ SOLN
0.2500 mg | INTRAMUSCULAR | Status: DC | PRN
  Administered 2019-08-01 (×2): 0.5 mg via INTRAVENOUS

## 2019-08-01 MED ORDER — ONDANSETRON HCL 4 MG/2ML IJ SOLN
INTRAMUSCULAR | Status: DC | PRN
  Administered 2019-08-01 (×2): 4 mg via INTRAVENOUS

## 2019-08-01 MED ORDER — HYDROCODONE-ACETAMINOPHEN 7.5-325 MG PO TABS
1.0000 | ORAL_TABLET | Freq: Once | ORAL | Status: AC | PRN
  Administered 2019-08-01: 1 via ORAL

## 2019-08-01 MED ORDER — PHENYLEPHRINE HCL (PRESSORS) 10 MG/ML IV SOLN
INTRAVENOUS | Status: AC
  Filled 2019-08-01: qty 2

## 2019-08-01 MED ORDER — EPHEDRINE 5 MG/ML INJ
INTRAVENOUS | Status: AC
  Filled 2019-08-01: qty 10

## 2019-08-01 MED ORDER — FENTANYL CITRATE (PF) 250 MCG/5ML IJ SOLN
INTRAMUSCULAR | Status: DC | PRN
  Administered 2019-08-01: 50 ug via INTRAVENOUS
  Administered 2019-08-01: 75 ug via INTRAVENOUS
  Administered 2019-08-01: 125 ug via INTRAVENOUS

## 2019-08-01 MED ORDER — PROPOFOL 10 MG/ML IV BOLUS
INTRAVENOUS | Status: DC | PRN
  Administered 2019-08-01: 130 mg via INTRAVENOUS

## 2019-08-01 MED ORDER — PHENYLEPHRINE HCL-NACL 10-0.9 MG/250ML-% IV SOLN
INTRAVENOUS | Status: DC | PRN
  Administered 2019-08-01: 25 ug/min via INTRAVENOUS

## 2019-08-01 MED ORDER — ROCURONIUM BROMIDE 10 MG/ML (PF) SYRINGE
PREFILLED_SYRINGE | INTRAVENOUS | Status: DC | PRN
  Administered 2019-08-01: 5 mg via INTRAVENOUS
  Administered 2019-08-01: 50 mg via INTRAVENOUS
  Administered 2019-08-01: 10 mg via INTRAVENOUS
  Administered 2019-08-01: 15 mg via INTRAVENOUS
  Administered 2019-08-01: 10 mg via INTRAVENOUS

## 2019-08-01 MED ORDER — SUGAMMADEX SODIUM 500 MG/5ML IV SOLN
INTRAVENOUS | Status: DC | PRN
  Administered 2019-08-01: 300 mg via INTRAVENOUS

## 2019-08-01 MED ORDER — TIZANIDINE HCL 4 MG PO TABS: 4.0000 mg | ORAL_TABLET | Freq: Three times a day (TID) | ORAL | 1 refills | Status: DC | PRN

## 2019-08-01 MED ORDER — SODIUM CHLORIDE 0.9 % IR SOLN
Status: DC | PRN
  Administered 2019-08-01: 2000 mL

## 2019-08-01 MED ORDER — CEFAZOLIN SODIUM-DEXTROSE 2-4 GM/100ML-% IV SOLN
2.0000 g | INTRAVENOUS | Status: AC
  Administered 2019-08-01: 10:00:00 2 g via INTRAVENOUS
  Filled 2019-08-01: qty 100

## 2019-08-01 MED ORDER — HYDROCODONE-ACETAMINOPHEN 7.5-325 MG PO TABS
ORAL_TABLET | ORAL | Status: AC
  Filled 2019-08-01: qty 1

## 2019-08-01 MED ORDER — MIDAZOLAM HCL 2 MG/2ML IJ SOLN
1.0000 mg | INTRAMUSCULAR | Status: DC
  Administered 2019-08-01: 2 mg via INTRAVENOUS
  Filled 2019-08-01 (×2): qty 2

## 2019-08-01 MED ORDER — SUGAMMADEX SODIUM 500 MG/5ML IV SOLN
INTRAVENOUS | Status: AC
  Filled 2019-08-01: qty 5

## 2019-08-01 MED ORDER — FENTANYL CITRATE (PF) 100 MCG/2ML IJ SOLN
50.0000 ug | INTRAMUSCULAR | Status: DC
  Administered 2019-08-01: 100 ug via INTRAVENOUS
  Filled 2019-08-01 (×2): qty 2

## 2019-08-01 MED ORDER — DEXAMETHASONE SODIUM PHOSPHATE 10 MG/ML IJ SOLN
INTRAMUSCULAR | Status: AC
  Filled 2019-08-01: qty 2

## 2019-08-01 MED ORDER — OXYCODONE-ACETAMINOPHEN 5-325 MG PO TABS: ORAL_TABLET | ORAL | 0 refills | Status: DC

## 2019-08-01 MED ORDER — FENTANYL CITRATE (PF) 100 MCG/2ML IJ SOLN
INTRAMUSCULAR | Status: AC
  Filled 2019-08-01: qty 2

## 2019-08-01 MED ORDER — DEXAMETHASONE SODIUM PHOSPHATE 10 MG/ML IJ SOLN
INTRAMUSCULAR | Status: DC | PRN
  Administered 2019-08-01: 10 mg via INTRAVENOUS

## 2019-08-01 MED ORDER — MIDAZOLAM HCL 2 MG/2ML IJ SOLN
INTRAMUSCULAR | Status: AC
  Filled 2019-08-01: qty 2

## 2019-08-01 MED ORDER — HYDROMORPHONE HCL 1 MG/ML IJ SOLN
INTRAMUSCULAR | Status: AC
  Filled 2019-08-01: qty 1

## 2019-08-01 MED ORDER — CHLORHEXIDINE GLUCONATE 4 % EX LIQD: 60.0000 mL | Freq: Once | CUTANEOUS | Status: DC

## 2019-08-01 MED ORDER — PROPOFOL 10 MG/ML IV BOLUS
INTRAVENOUS | Status: AC
  Filled 2019-08-01: qty 20

## 2019-08-01 SURGICAL SUPPLY — 76 items
BAG ZIPLOCK 12X15 (MISCELLANEOUS) ×3 IMPLANT
BIT DRILL 1.6MX128 (BIT) IMPLANT
BIT DRILL 1.6MX128MM (BIT)
BLADE SAW SAG 73X25 THK (BLADE) ×2
BLADE SAW SGTL 18X1.27X75 (BLADE) IMPLANT
BLADE SAW SGTL 18X1.27X75MM (BLADE)
BLADE SAW SGTL 73X25 THK (BLADE) ×1 IMPLANT
CEMENT BONE DEPUY (Cement) ×3 IMPLANT
CHLORAPREP W/TINT 26 (MISCELLANEOUS) ×6 IMPLANT
CLOSURE STERI-STRIP 1/2X4 (GAUZE/BANDAGES/DRESSINGS) ×1
CLOSURE WOUND 1/2 X4 (GAUZE/BANDAGES/DRESSINGS) ×1
CLSR STERI-STRIP ANTIMIC 1/2X4 (GAUZE/BANDAGES/DRESSINGS) ×2 IMPLANT
COOLER ICEMAN CLASSIC (MISCELLANEOUS) IMPLANT
COVER BACK TABLE 60X90IN (DRAPES) ×3 IMPLANT
COVER SURGICAL LIGHT HANDLE (MISCELLANEOUS) ×3 IMPLANT
COVER WAND RF STERILE (DRAPES) IMPLANT
DRAPE INCISE IOBAN 66X45 STRL (DRAPES) ×3 IMPLANT
DRAPE ORTHO SPLIT 77X108 STRL (DRAPES) ×4
DRAPE POUCH INSTRU U-SHP 10X18 (DRAPES) ×3 IMPLANT
DRAPE SHEET LG 3/4 BI-LAMINATE (DRAPES) ×3 IMPLANT
DRAPE SURG 17X11 SM STRL (DRAPES) ×3 IMPLANT
DRAPE SURG ORHT 6 SPLT 77X108 (DRAPES) ×2 IMPLANT
DRAPE U-SHAPE 47X51 STRL (DRAPES) ×3 IMPLANT
DRESSING AQUACEL AG SP 3.5X6 (GAUZE/BANDAGES/DRESSINGS) ×1 IMPLANT
DRSG AQUACEL AG ADV 3.5X 6 (GAUZE/BANDAGES/DRESSINGS) ×3 IMPLANT
DRSG AQUACEL AG SP 3.5X6 (GAUZE/BANDAGES/DRESSINGS) ×3
ELECT BLADE TIP CTD 4 INCH (ELECTRODE) ×3 IMPLANT
ELECT REM PT RETURN 15FT ADLT (MISCELLANEOUS) ×3 IMPLANT
GLENOID CORTILOC AEQUALIS  L60 (Shoulder) ×2 IMPLANT
GLENOID CORTILOC AEQUALIS L60 (Shoulder) ×1 IMPLANT
GLOVE BIO SURGEON STRL SZ7 (GLOVE) ×3 IMPLANT
GLOVE BIO SURGEON STRL SZ7.5 (GLOVE) ×3 IMPLANT
GLOVE BIOGEL PI IND STRL 7.0 (GLOVE) ×1 IMPLANT
GLOVE BIOGEL PI IND STRL 8 (GLOVE) ×1 IMPLANT
GLOVE BIOGEL PI INDICATOR 7.0 (GLOVE) ×2
GLOVE BIOGEL PI INDICATOR 8 (GLOVE) ×2
GOWN STRL REUS W/TWL LRG LVL3 (GOWN DISPOSABLE) ×3 IMPLANT
GOWN STRL REUS W/TWL XL LVL3 (GOWN DISPOSABLE) ×3 IMPLANT
GUIDEWIRE GLENOID 2.5X220 (WIRE) ×3 IMPLANT
HANDPIECE INTERPULSE COAX TIP (DISPOSABLE) ×2
HEAD HUM 4XHI OFST 23X52X (Head) ×1 IMPLANT
HEAD HUMERAL AEQUALIS 52X23 (Head) ×2 IMPLANT
HEMOSTAT SURGICEL 4X8 (HEMOSTASIS) ×3 IMPLANT
HOOD PEEL AWAY FLYTE STAYCOOL (MISCELLANEOUS) ×9 IMPLANT
KIT BASIN OR (CUSTOM PROCEDURE TRAY) ×3 IMPLANT
KIT TURNOVER KIT A (KITS) IMPLANT
MANIFOLD NEPTUNE II (INSTRUMENTS) ×3 IMPLANT
NEEDLE MA TROC 1/2 (NEEDLE) ×6 IMPLANT
NS IRRIG 1000ML POUR BTL (IV SOLUTION) ×3 IMPLANT
PACK SHOULDER (CUSTOM PROCEDURE TRAY) ×3 IMPLANT
PAD COLD SHLDR WRAP-ON (PAD) IMPLANT
PENCIL SMOKE EVACUATOR (MISCELLANEOUS) IMPLANT
PROTECTOR NERVE ULNAR (MISCELLANEOUS) ×3 IMPLANT
RESTRAINT HEAD UNIVERSAL NS (MISCELLANEOUS) ×3 IMPLANT
RETRIEVER SUT HEWSON (MISCELLANEOUS) ×3 IMPLANT
SET HNDPC FAN SPRY TIP SCT (DISPOSABLE) ×1 IMPLANT
SLING ARM FOAM STRAP LRG (SOFTGOODS) ×3 IMPLANT
SLING ARM IMMOBILIZER LRG (SOFTGOODS) ×3 IMPLANT
SMARTMIX MINI TOWER (MISCELLANEOUS)
SPONGE LAP 18X18 RF (DISPOSABLE) ×3 IMPLANT
STEM HUMERAL FLEX 4C (Stem) ×3 IMPLANT
STRIP CLOSURE SKIN 1/2X4 (GAUZE/BANDAGES/DRESSINGS) ×2 IMPLANT
SUCTION FRAZIER HANDLE 12FR (TUBING) ×2
SUCTION TUBE FRAZIER 12FR DISP (TUBING) ×1 IMPLANT
SUPPORT WRAP ARM LG (MISCELLANEOUS) ×3 IMPLANT
SUT ETHIBOND 2 V 37 (SUTURE) ×9 IMPLANT
SUT MNCRL AB 4-0 PS2 18 (SUTURE) ×3 IMPLANT
SUT VIC AB 2-0 CT1 27 (SUTURE) ×4
SUT VIC AB 2-0 CT1 TAPERPNT 27 (SUTURE) ×2 IMPLANT
TAPE LABRALWHITE 1.5X36 (TAPE) ×3 IMPLANT
TAPE SUT LABRALTAP WHT/BLK (SUTURE) ×3 IMPLANT
TOWEL OR 17X26 10 PK STRL BLUE (TOWEL DISPOSABLE) ×3 IMPLANT
TOWEL OR NON WOVEN STRL DISP B (DISPOSABLE) ×3 IMPLANT
TOWER SMARTMIX MINI (MISCELLANEOUS) IMPLANT
WATER STERILE IRR 1000ML POUR (IV SOLUTION) ×3 IMPLANT
YANKAUER SUCT BULB TIP 10FT TU (MISCELLANEOUS) ×3 IMPLANT

## 2019-08-01 NOTE — Transfer of Care (Signed)
Immediate Anesthesia Transfer of Care Note  Patient: Dave Rogers  Procedure(s) Performed: TOTAL SHOULDER ARTHROPLASTY (Right Shoulder)  Patient Location: PACU  Anesthesia Type:General  Level of Consciousness: drowsy, patient cooperative and responds to stimulation  Airway & Oxygen Therapy: Patient Spontanous Breathing and Patient connected to face mask oxygen  Post-op Assessment: Report given to RN and Post -op Vital signs reviewed and stable  Post vital signs: Reviewed and stable  Last Vitals:  Vitals Value Taken Time  BP 147/80 08/01/19 1142  Temp    Pulse 72 08/01/19 1143  Resp    SpO2 100 % 08/01/19 1143  Vitals shown include unvalidated device data.  Last Pain:  Vitals:   08/01/19 0910  TempSrc:   PainSc: 0-No pain      Patients Stated Pain Goal: 3 (08/01/19 0900)  Complications: No apparent anesthesia complications

## 2019-08-01 NOTE — H&P (Signed)
Dave Rogers is an 59 y.o. male.   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  Pain interferes with sleep and quality of life.   Past Medical History:  Diagnosis Date  . Arthritis   . Hyperlipidemia     Past Surgical History:  Procedure Laterality Date  . ANAL FISSURE REPAIR  2006  . HERNIA REPAIR Right 2006    Family History  Problem Relation Age of Onset  . Dementia Mother   . Cancer Father   . Dementia Father    Social History:  reports that he has never smoked. He has never used smokeless tobacco. He reports current alcohol use of about 6.0 standard drinks of alcohol per week. He reports that he does not use drugs.  Allergies: No Known Allergies  Medications Prior to Admission  Medication Sig Dispense Refill  . Ascorbic Acid (VITAMIN C PO) Take 1 tablet by mouth daily.    Marland Kitchen b complex vitamins tablet Take 1 tablet by mouth daily.    . rosuvastatin (CRESTOR) 40 MG tablet Take 1/2 to 1 tablet daily or as directed for Cholesterol (Patient taking differently: Take 20 mg by mouth every other day. ) 30 tablet 5  . VITAMIN D PO Take 4 capsules by mouth daily.    Marland Kitchen ibuprofen (ADVIL) 200 MG tablet Take 400 mg by mouth daily as needed for headache or moderate pain.    . Multiple Vitamins-Minerals (ZINC PO) Take 1 tablet by mouth daily.      No results found for this or any previous visit (from the past 48 hour(s)). CT CHEST W CONTRAST  Result Date: 07/30/2019 CLINICAL DATA:  Abnormal chest x-ray EXAM: CT CHEST WITH CONTRAST TECHNIQUE: Multidetector CT imaging of the chest was performed during intravenous contrast administration. 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 Minimal aortic atherosclerosis  normal heart size. Three-vessel coronary artery calcifications. No pericardial effusion. Mediastinum/Nodes: No enlarged mediastinal, hilar, or axillary lymph nodes. Thyroid gland, trachea, and esophagus demonstrate no significant findings. Lungs/Pleura: Lungs are clear. No pleural effusion or pneumothorax. Upper Abdomen: No acute abnormality. Musculoskeletal: No chest wall mass or suspicious bone lesions identified. IMPRESSION: 1. No pulmonary mass or nodule of the left lung to correspond to suspected radiographic findings, likely explained by superimposition of ribs on prior radiograph. 2. 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 The remainder of vessel is normal in caliber. Minimal aortic atherosclerosis. Aortic Atherosclerosis (ICD10-I70.0). 3. Three-vessel coronary artery calcifications. Electronically Signed   By: Lauralyn Primes M.D.   On: 07/30/2019 16:47    Review of Systems  All other systems reviewed and are negative.   Blood pressure 138/71, pulse (!) 56, temperature 98.8 F (37.1 C), temperature source Oral, resp. rate 18, SpO2 99 %. Physical Exam  Constitutional: He is oriented to person, place, and time. He appears well-developed and well-nourished.  HENT:  Head: Atraumatic.  Eyes: EOM are normal.  Cardiovascular: Intact distal pulses.  Respiratory: Effort normal.  Musculoskeletal:     Comments: R shoulder pain with limited motion. NVID  Neurological: He is alert and oriented to person, place, and time.  Skin: Skin is warm and dry.  Psychiatric: He has a normal mood and affect.     Assessment/Plan R shoulder endstage arthritis failed  conservative treatment. Plan R TSA Risks / benefits of surgery discussed Consent on chart  NPO for OR Preop antibiotics   Isabella Stalling, MD 08/01/2019, 8:51 AM

## 2019-08-01 NOTE — Progress Notes (Signed)
AssistedDr. Houser with right, ultrasound guided, interscalene  block. Side rails up, monitors on throughout procedure. See vital signs in flow sheet. Tolerated Procedure well.  

## 2019-08-01 NOTE — Anesthesia Postprocedure Evaluation (Signed)
Anesthesia Post Note  Patient: Dave Rogers  Procedure(s) Performed: TOTAL SHOULDER ARTHROPLASTY (Right Shoulder)     Anesthesia Type: General    Last Vitals:  Vitals:   08/01/19 1230 08/01/19 1330  BP: 134/74 112/67  Pulse: 62 68  Resp: 13 16  Temp: 36.9 C 36.8 C  SpO2: 96% 95%    Last Pain:  Vitals:   08/01/19 1330  TempSrc:   PainSc: 2                  Trevor Iha

## 2019-08-01 NOTE — Evaluation (Signed)
Occupational Therapy Evaluation Patient Details Name: Dave Rogers MRN: [id number] DOB: [birth year] Today's Date: 08/01/2019    History of Present Illness 59 y o man s/p R TSA   Clinical Impression   Pt was admitted for the above sx. All education was completed. Pt will follow up with Dr Ave Filter for further rehab     Follow Up Recommendations  Follow surgeon's recommendation for DC plan and follow-up therapies    Equipment Recommendations  None recommended by OT    Recommendations for Other Services       Precautions / Restrictions Precautions Precautions: Shoulder Type of Shoulder Precautions: wear sling except for bathing,dressing and exercises.  May move elbow wrist and fingers.  Supine PROM to 90 FF and ER to neutral.  NO ABD Shoulder Interventions: Shoulder sling/immobilizer Precaution Booklet Issued: Yes (comment) Restrictions Other Position/Activity Restrictions: NWB RUE      Mobility Bed Mobility               General bed mobility comments: supervision  Transfers                 General transfer comment: supervision    Balance                                           ADL either performed or assessed with clinical judgement   ADL Overall ADL's : Needs assistance/impaired Eating/Feeding: Set up   Grooming: Set up   Upper Body Bathing: Moderate assistance   Lower Body Bathing: Moderate assistance   Upper Body Dressing : Maximal assistance   Lower Body Dressing: Maximal assistance                 General ADL Comments: ambulated in cubicle with min guard for safety.  Pt still a little sleepy.  He and wife declined gait belt.  Performed PROM to 90 FF and ER to neutral. Educated on shoulder protocol and ice machine.  Reviewed precautions, positioning in bed and sling application and adjustment. Pt will sleep in regular bed at home.  Assisted with LB dressing; NT requested disposable gown to leave in. Pt only had  t-shirt (not extra large)     Vision         Perception     Praxis      Pertinent Vitals/Pain Pain Assessment: Faces Faces Pain Scale: Hurts a little bit Pain Location: R shoulder Pain Descriptors / Indicators: Sore Pain Intervention(s): Limited activity within patient's tolerance;Monitored during session;Premedicated before session;Repositioned;Ice applied     Hand Dominance Right   Extremity/Trunk Assessment Upper Extremity Assessment Upper Extremity Assessment: RUE deficits/detail(able to move fingers and wrist)           Communication Communication Communication: No difficulties   Cognition Arousal/Alertness: Awake/alert(a little sleepy) Behavior During Therapy: WFL for tasks assessed/performed Overall Cognitive Status: Within Functional Limits for tasks assessed                                     General Comments       Exercises     Shoulder Instructions      Home Living Family/patient expects to be discharged to:: Private residence Living Arrangements: Spouse/significant other                 Bathroom Shower/Tub: Walk-in  shower   Bathroom Toilet: Standard     Home Equipment: None          Prior Functioning/Environment Level of Independence: Independent                 OT Problem List:        OT Treatment/Interventions:      OT Goals(Current goals can be found in the care plan section) Acute Rehab OT Goals OT Goal Formulation: All assessment and education complete, DC therapy  OT Frequency:     Barriers to D/C:            Co-evaluation              AM-PAC OT "6 Clicks" Daily Activity     Outcome Measure Help from another person eating meals?: A Little Help from another person taking care of personal grooming?: A Little Help from another person toileting, which includes using toliet, bedpan, or urinal?: A Little Help from another person bathing (including washing, rinsing, drying)?: A Lot Help  from another person to put on and taking off regular upper body clothing?: A Lot Help from another person to put on and taking off regular lower body clothing?: A Lot 6 Click Score: 15   End of Session    Activity Tolerance: Patient tolerated treatment well Patient left: in bed;with call bell/phone within reach  OT Visit Diagnosis: Pain Pain - Right/Left: Right Pain - part of body: Shoulder                Time: 4401-0272 OT Time Calculation (min): 41 min Charges:  OT General Charges $OT Visit: 1 Visit OT Evaluation $OT Eval Low Complexity: 1 Low OT Treatments $Self Care/Home Management : 8-22 mins $Therapeutic Exercise: 8-22 mins  Haru Shaff S, OTR/L Acute Rehabilitation Services 08/01/2019  [place] 08/01/2019, 2:40 PM

## 2019-08-01 NOTE — Anesthesia Procedure Notes (Addendum)
Anesthesia Regional Block: Interscalene brachial plexus block   Pre-Anesthetic Checklist: ,, timeout performed, Correct Patient, Correct Site, Correct Laterality, Correct Procedure, Correct Position, site marked, Risks and benefits discussed,  Surgical consent,  Pre-op evaluation,  At surgeon's request and post-op pain management  Laterality: Upper and Right  Prep: Maximum Sterile Barrier Precautions used, chloraprep       Needles:  Injection technique: Single-shot  Needle Type: Echogenic Needle     Needle Length: 5cm  Needle Gauge: 21     Additional Needles:   Procedures:,,,, ultrasound used (permanent image in chart),,,,  Narrative:  Start time: 08/01/2019 8:59 AM End time: 08/01/2019 9:04 AM Injection made incrementally with aspirations every 5 mL.  Performed by: Personally  Anesthesiologist: Trevor Iha, MD  Additional Notes: Block assessed prior to procedure. Patient tolerated procedure well.

## 2019-08-01 NOTE — Op Note (Signed)
Procedure(s): TOTAL SHOULDER ARTHROPLASTY Procedure Note  Dave Rogers male 59 y.o. 08/01/2019  Preoperative diagnosis: End-stage right shoulder osteoarthritis  Postoperative diagnosis: Same  Procedure(s) and Anesthesia Type:   RIGHT TOTAL SHOULDER ARTHROPLASTY - Choice  Surgeon(s) and Role:    Jones Broom, MD - Primary   Indications:  59 y.o. male  With endstage right shoulder arthritis. Pain and dysfunction interfered with quality of life and nonoperative treatment with activity modification, NSAIDS and injections failed.     Surgeon: Berline Lopes   Assistants: Damita Lack PA-C [facility] was present and scrubbed throughout the procedure and was essential in positioning, retraction, exposure, and closure)  Anesthesia: General endotracheal anesthesia with preoperative interscalene block given by the attending anesthesiologist    Procedure Detail  TOTAL SHOULDER ARTHROPLASTY  Findings: Tornier flex anatomic press-fit size 4 stem with a 52 x 23 head, cemented size 60 large Cortiloc glenoid.   A lesser tuberosity osteotomy was performed and repaired at the conclusion of the procedure.  Estimated Blood Loss:  300 mL         Drains: None   Blood Given: none          Specimens: none        Complications:  * No complications entered in OR log *         Disposition: PACU - hemodynamically stable.         Condition: stable    Procedure:   The patient was identified in the preoperative holding area where I personally marked the operative extremity after verifying with the patient and consent. He  was taken to the operating room where He was transferred to the   operative table.  The patient received an interscalene block in   the holding area by the attending anesthesiologist.  General anesthesia was induced   in the operating room without complication.  The patient did receive IV  Ancef prior to the commencement of the procedure.  The patient was    placed in the beach-chair position with the back raised about 30   degrees.  The nonoperative extremity and head and neck were carefully   positioned and padded protecting against neurovascular compromise.  The   left upper extremity was then prepped and draped in the standard sterile   fashion.    The appropriate operative time-out was performed with   Anesthesia, the perioperative staff, as well as myself and we all agreed   that the right side was the correct operative site.  An approximately   10 cm incision was made from the tip of the coracoid to the center point of the   humerus at the level of the axilla.  Dissection was carried down sharply   through subcutaneous tissues and cephalic vein was identified and taken   laterally with the deltoid.  The pectoralis major was taken medially.  The   upper 1 cm of the pectoralis major was released from its attachment on   the humerus.  The clavipectoral fascia was incised just lateral to the   conjoined tendon.  This incision was carried up to but not into the   coracoacromial ligament.  Digital palpation was used to prove   integrity of the axillary nerve which was protected throughout the   procedure.  Musculocutaneous nerve was not palpated in the operative   field.  Conjoined tendon was then retracted gently medially and the   deltoid laterally.  Anterior circumflex humeral vessels were clamped and  coagulated.  The soft tissues overlying the biceps was incised and this   incision was carried across the transverse humeral ligament to the base   of the coracoid.  The biceps was noted to be severely degenerated. It was released from the superior labrum. The biceps was then tenodesed to the soft tissue just above   pectoralis major and the remaining portion of the biceps superiorly was   excised.  An osteotomy was performed at the lesser tuberosity.  The capsule was then   released all the way down to the 6 o'clock position of the humeral  head.   The humeral head was then delivered with simultaneous adduction,   extension and external rotation.  All humeral osteophytes were removed   and the anatomic neck of the humerus was marked and cut free hand at   approximately 25 degrees retroversion within about 3 mm of the cuff   reflection posteriorly.  He had an impressively significant amount of osteophyte which extended down very inferiorly.  An osteotome was used.  Great care was taken to protect the surrounding neurovascular structures.  The head size was estimated to be a 52 medium   offset.  At that point, the humeral head was retracted posteriorly with   a Fukuda retractor.   Remaining portion of the capsule was released at the base of the   coracoid.  The remaining biceps anchor and the entire anterior-inferior   labrum was excised.  The posterior labrum was also excised but the   posterior capsule was not released.  The guidepin was placed bicortically with +5 degree elevated guide.  The reamer was used to ream to concentric bone with punctate bleeding correcting some of the retroversion.  This gave an excellent concentric surface.  The center hole was then drilled for an anchor peg glenoid followed by the three peripheral holes and none of the holes   exited the glenoid wall.  I then pulse irrigated these holes and dried   them with Surgicel.  The three peripheral holes were then   pressurized cemented and the anchor peg glenoid was placed and impacted   with an excellent fit.  The glenoid was a large 60 component.  The proximal humerus was then again exposed taking care not to displace the glenoid.    The entry awl was used followed by sounding reamers and then sequentially broached from size 1-4. This was then left in place and the calcar planer was used. Trial head was placed with a 52 x 23.  With the trial implantation of the component,  there was approximately 50% posterior translation with immediate snap back to the    anatomic position.  With forward elevation, there was no tendency   towards posterior subluxation.   The trial was removed and the final implant was prepared on a back table.  The trial was removed and the final implant was prepared on a back table.   3 small holes were drilled on the medial side of the lesser tuberosity osteotomy, through which 2 labral tapes were passed. The implant was then placed through the loop of the 2 labral tapes and impacted with an excellent press-fit. This achieved excellent anatomic reconstruction of the proximal humerus.  The joint was then copiously irrigated with pulse lavage.  The subscapularis and   lesser tuberosity osteotomy were then repaired using the 2 labral tapes previously passed in a double row fashion with horizontal mattress sutures medially brought over through bone tunnels  tied over a bone bridge laterally.   One #1 Ethibond was placed at the rotator interval just above   the lesser tuberosity. Copious irrigation was used. Skin was closed with 2-0 Vicryl sutures in the deep dermal layer and 4-0 Monocryl in a subcuticular  running fashion.  Sterile dressings were then applied including Aquacel.  The patient was placed in a sling and allowed to awaken from general anesthesia and taken to the recovery room in stable condition.      POSTOPERATIVE PLAN:  Early passive range of motion will be allowed with the goal of 0 degrees external rotation and 90 degrees forward elevation.  No internal rotation at this time.  No active motion of the arm until the lesser tuberosity heals.  The patient will be observed in the recovery room and if he is doing well physiologically and pain is well controlled could be discharged home today with family.  If there is concern we can keep him overnight.

## 2019-08-01 NOTE — Discharge Instructions (Signed)
Discharge Instructions after Total Shoulder Arthroplasty   . A sling has been provided for you. Remove the sling 5 times each day to perform motion exercises. Use ice on the shoulder intermittently over the first 48 hours after surgery.  . Pain medication has been prescribed for you.  . Use your medication liberally over the first 48 hours, and then begin to taper your use. You may take Extra Strength Tylenol or Tylenol only in place of the pain pills. DO NOT take ANY nonsteroidal anti-inflammatory pain medications: Advil, Motrin, Ibuprofen, Aleve, Naproxen, or Naprosyn. . Take one aspirin a day for 2 weeks after surgery, unless you have an aspirin sensitivity/allergy or asthma. . Leave your dressing on until your first follow up visit.  You may shower with the dressing.  Hold your arm as if you still have your sling on while you shower. . Active reaching and lifting are not permitted. You may use the operative arm for activities of daily living that do not require the operative arm to leave the side of the body, such as eating, drinking, bathing, etc.  . Three to 5 times each day you should perform assisted overhead reaching and external rotation (outward turning) exercises with the operative arm. You were taught these exercises prior to discharge. Both exercises should be done with the non-operative arm used as the "therapist arm" while the operative arm remains relaxed. Ten of each exercise should be done three to five times each day.   Overhead reach is helping to lift your stiff arm up as high as it will go. To stretch your overhead reach, lie flat on your back, relax, and grasp the wrist of the tight shoulder with your opposite hand. Using the power in your opposite arm, bring the stiff arm up as far as it is comfortable. Start holding it for ten seconds and then work up to where you can hold it for a count of 30. Breathe slowly and deeply while the arm is moved. Repeat this stretch ten times,  trying to help the ar up a little higher each time.     External rotation is turning the arm out to the side while your elbow stays close to your body. External rotation is best stretched while you are lying on your back. Hold a cane, yardstick, broom handle, or dowel in both hands. Bend both elbows to a right angle. Use steady, gentle force from your normal arm to rotate the hand of the stiff shoulder out away from your body. Continue the rotation until it is straight in front of you holding it there for a count of 10. Do not go beyond this level of rotation until seen back by Dr. Chandler. Repeat this exercise ten times slowly.      Please call [phone] during normal business hours or [phone] after hours for any problems. Including the following:  - excessive redness of the incisions - drainage for more than 4 days - fever of more than 101.5 F  *Please note that pain medications will not be refilled after hours or on weekends.    

## 2019-08-01 NOTE — Anesthesia Procedure Notes (Signed)
Procedure Name: Intubation Date/Time: 08/01/2019 9:41 AM Performed by: Minerva Ends, CRNA Pre-anesthesia Checklist: Patient identified, Emergency Drugs available, Suction available and Patient being monitored Patient Re-evaluated:Patient Re-evaluated prior to induction Oxygen Delivery Method: Circle System Utilized Preoxygenation: Pre-oxygenation with 100% oxygen Induction Type: IV induction Ventilation: Mask ventilation without difficulty Laryngoscope Size: Miller and 2 Grade View: Grade I Tube type: Oral Tube size: 7.0 mm Number of attempts: 1 Airway Equipment and Method: Stylet Placement Confirmation: ETT inserted through vocal cords under direct vision,  positive ETCO2 and breath sounds checked- equal and bilateral Secured at: 22 cm Tube secured with: Tape Dental Injury: Teeth and Oropharynx as per pre-operative assessment  Comments: Smooth IV induction Houser MD intubation AM CRNA atraumatic-- teeth and mouth as preop-- chipping present on front teeth- pt reported in preop-- unchanged with intubation-- bilat BS

## 2019-08-02 ENCOUNTER — Encounter: Payer: Self-pay | Admitting: *Deleted

## 2019-08-02 ENCOUNTER — Other Ambulatory Visit: Payer: Self-pay | Admitting: Internal Medicine

## 2019-08-02 DIAGNOSIS — E782 Mixed hyperlipidemia: Secondary | ICD-10-CM

## 2019-08-20 NOTE — Discharge Summary (Signed)
Patient ID: Griffin Gerrard MRN: [id number] DOB/AGE: [birth year] 57 y.o.  Admit date: 08/01/2019 Discharge date: 08/01/2019   Admission Diagnoses:  Active Problems:   S/P reverse total shoulder arthroplasty, right   Discharge Diagnoses:  Same  Past Medical History:  Diagnosis Date  . Arthritis   . Hyperlipidemia     Surgeries: Procedure(s): TOTAL SHOULDER ARTHROPLASTY on 08/01/2019   Consultants:   Discharged Condition: Improved  Hospital Course: Winter Trefz is an 59 y.o. male who was admitted 08/01/2019 for operative treatment of right shoulder OA. Patient has severe unremitting pain that affects sleep, daily activities, and work/hobbies. After pre-op clearance the patient was taken to the operating room on 08/01/2019 and underwent  Procedure(s): TOTAL SHOULDER ARTHROPLASTY.    Patient was given perioperative antibiotics:  Anti-infectives (From admission, onward)   Start     Dose/Rate Route Frequency Ordered Stop   08/01/19 0830  ceFAZolin (ANCEF) IVPB 2g/100 mL premix     2 g 200 mL/hr over 30 Minutes Intravenous On call to O.R. 08/01/19 [id number] 08/01/19 0949       Patient was given sequential compression devices, early ambulation, and chemoprophylaxis to prevent DVT.  Patient benefited maximally from hospital stay and there were no complications.    Recent vital signs: No data found.   Recent laboratory studies: No results for input(s): WBC, HGB, HCT, PLT, NA, K, CL, CO2, BUN, CREATININE, GLUCOSE, INR, CALCIUM in the last 72 hours.  Invalid input(s): PT, 2   Discharge Medications:   Allergies as of 08/01/2019   No Known Allergies     Medication List    STOP taking these medications   ibuprofen 200 MG tablet Commonly known as: ADVIL     TAKE these medications   b complex vitamins tablet Take 1 tablet by mouth daily.   oxyCODONE-acetaminophen 5-325 MG tablet Commonly known as: Percocet Take 1-2 tablets every 4 hours as needed for post operative pain. MAX  6/day   tiZANidine 4 MG tablet Commonly known as: Zanaflex Take 1 tablet (4 mg total) by mouth every 8 (eight) hours as needed for muscle spasms.   VITAMIN C PO Take 1 tablet by mouth daily.   VITAMIN D PO Take 4 capsules by mouth daily.   ZINC PO Take 1 tablet by mouth daily.       Diagnostic Studies: DG Chest 2 View  Result Date: 07/29/2019 CLINICAL DATA:  59 year old male under preoperative evaluation prior to shoulder arthroplasty. EXAM: CHEST - 2 VIEW COMPARISON:  None. FINDINGS: Lung volumes are normal. No consolidative airspace disease. No pleural effusions. No pneumothorax. Ill-defined nodular density projecting over the region of the lingula. Pulmonary vasculature and the cardiomediastinal silhouette are within normal limits. IMPRESSION: 1. No radiographic evidence of acute cardiopulmonary disease. 2. Ill-defined nodular density projecting over the lingula. Follow-up nonemergent chest CT is recommended in the near future to better evaluate this finding and exclude malignancy. Electronically Signed   By: Trudie Reed M.D.   On: 07/29/2019 09:50   CT CHEST W CONTRAST  Result Date: 07/30/2019 CLINICAL DATA:  Abnormal chest x-ray EXAM: CT CHEST WITH CONTRAST TECHNIQUE: Multidetector CT imaging of the chest was performed during intravenous contrast administration. 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 Minimal aortic atherosclerosis normal heart size. Three-vessel coronary artery calcifications. No pericardial effusion. Mediastinum/Nodes: No enlarged mediastinal, hilar, or axillary lymph nodes. Thyroid gland, trachea,  and esophagus demonstrate no significant findings. Lungs/Pleura: Lungs are clear. No pleural effusion or pneumothorax. Upper Abdomen: No acute abnormality. Musculoskeletal: No chest wall mass or  suspicious bone lesions identified. IMPRESSION: 1. No pulmonary mass or nodule of the left lung to correspond to suspected radiographic findings, likely explained by superimposition of ribs on prior radiograph. 2. 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 The remainder of vessel is normal in caliber. Minimal aortic atherosclerosis. Aortic Atherosclerosis (ICD10-I70.0). 3. Three-vessel coronary artery calcifications. Electronically Signed   By: Eddie Candle M.D.   On: 07/30/2019 16:47   DG Shoulder Right Port  Result Date: 08/01/2019 CLINICAL DATA:  Status post right shoulder replacement. EXAM: PORTABLE RIGHT SHOULDER COMPARISON:  None. FINDINGS: Single AP view demonstrates a right shoulder hemiarthroplasty. The prosthesis appears well seated and well aligned with the glenoid. No acute fracture. AC joint normally spaced and aligned. No degenerative change. No bone lesion. Soft tissues demonstrate calcification or ossification inferior to the glenohumeral joint and a small amount of air adjacent to the lateral proximal humerus, presumed postoperative. IMPRESSION: 1. Well seated and well aligned right shoulder hemiarthroplasty. 2. No acute fracture or evidence of an operative complication Electronically Signed   By: Lajean Manes M.D.   On: 08/01/2019 12:08    Disposition: Discharge disposition: 01-Home or Self Care       Discharge Instructions    Call MD / Call 911   Complete by: As directed    If you experience chest pain or shortness of breath, CALL 911 and be transported to the hospital emergency room.  If you develope a fever above 101 F, pus (white drainage) or increased drainage or redness at the wound, or calf pain, call your surgeon's office.   Constipation Prevention   Complete by: As directed    Drink plenty of fluids.  Prune juice may be helpful.  You may use a stool softener, such as Colace (over the counter) 100 mg twice a day.  Use MiraLax  (over the counter) for constipation as needed.   Diet - low sodium heart healthy   Complete by: As directed    Increase activity slowly as tolerated   Complete by: As directed       Follow-up Information    Tania Ade, MD. Go on 08/14/2019.   Specialty: Orthopedic Surgery Why: Your appointment is scheduled for 11:15.  Contact information: [address] [place] [address] [phone]            Signed: Grier Mitts 08/20/2019, 3:26 PM

## 2019-09-01 ENCOUNTER — Encounter: Payer: Self-pay | Admitting: Internal Medicine

## 2019-09-01 DIAGNOSIS — I2510 Atherosclerotic heart disease of native coronary artery without angina pectoris: Secondary | ICD-10-CM | POA: Insufficient documentation

## 2019-09-01 DIAGNOSIS — I700 Atherosclerosis of aorta: Secondary | ICD-10-CM | POA: Insufficient documentation

## 2019-09-01 HISTORY — DX: Atherosclerotic heart disease of native coronary artery without angina pectoris: I25.10

## 2019-09-01 NOTE — Patient Instructions (Signed)

## 2019-09-01 NOTE — Progress Notes (Signed)
History of Present Illness:       This very nice 59 y.o.  MWM presents for 6 month follow up with labile elevated BP, HLD, Pre-Diabetes and Vitamin D Deficiency.       On Jan 14, patient had a reverse cup shoulder Arthroplasty by Dr Ave Filter.       Patient is followed expectantly for elevated BP & BP has been controlled at home. Today's BP is at goal - 112/76. Patient has had no complaints of any cardiac type chest pain, palpitations, dyspnea / orthopnea / PND, dizziness, claudication, or dependent edema.      Hyperlipidemia is controlled with diet & meds. Patient denies myalgias or other med SE's. Last Lipids were at goal:  Lab Results  Component Value Date   CHOL 146 05/27/2019   HDL 59 05/27/2019   LDLCALC 64 05/27/2019   TRIG 157 (H) 05/27/2019   CHOLHDL 2.5 05/27/2019    Also, the patient is monitored expectantly for abnormal glucose / glucose intolerance and has had no symptoms of reactive hypoglycemia, diabetic polys, paresthesias or visual blurring.  Last A1c was Normal & at goal:  Lab Results  Component Value Date   HGBA1C 5.0 02/19/2019        Further, the patient also has history of Vitamin D Deficiency("19" / Feb 2019)  and supplements vitamin D without any suspected side-effects. Last vitamin D was still low and he reports that he has increased his dose since then. :  Lab Results  Component Value Date   VD25OH 43 02/19/2019    Current Outpatient Medications on File Prior to Visit  Medication Sig  . Ascorbic Acid (VITAMIN C PO) Take 1 tablet by mouth daily.  Marland Kitchen b complex vitamins tablet Take 1 tablet by mouth daily.  . rosuvastatin (CRESTOR) 40 MG tablet Take 1 tablet Daily for Cholesterol  . VITAMIN D PO Take 1,000 Units by mouth daily. Takes 4 capsules daily   No current facility-administered medications on file prior to visit.    No Known Allergies  PMHx:   Past Medical History:  Diagnosis Date  . Arthritis   . Hyperlipidemia     Immunization  History  Administered Date(s) Administered  . PPD Test 02/19/2019    Past Surgical History:  Procedure Laterality Date  . ANAL FISSURE REPAIR  2006  . HERNIA REPAIR Right 2006  . TOTAL SHOULDER ARTHROPLASTY Right 08/01/2019   Procedure: TOTAL SHOULDER ARTHROPLASTY;  Surgeon: Jones Broom, MD;  Location: [facility];  Service: Orthopedics;  Laterality: Right;    FHx:    Reviewed / unchanged  SHx:    Reviewed / unchanged   Systems Review:  Constitutional: Denies fever, chills, wt changes, headaches, insomnia, fatigue, night sweats, change in appetite. Eyes: Denies redness, blurred vision, diplopia, discharge, itchy, watery eyes.  ENT: Denies discharge, congestion, post nasal drip, epistaxis, sore throat, earache, hearing loss, dental pain, tinnitus, vertigo, sinus pain, snoring.  CV: Denies chest pain, palpitations, irregular heartbeat, syncope, dyspnea, diaphoresis, orthopnea, PND, claudication or edema. Respiratory: denies cough, dyspnea, DOE, pleurisy, hoarseness, laryngitis, wheezing.  Gastrointestinal: Denies dysphagia, odynophagia, heartburn, reflux, water brash, abdominal pain or cramps, nausea, vomiting, bloating, diarrhea, constipation, hematemesis, melena, hematochezia  or hemorrhoids. Genitourinary: Denies dysuria, frequency, urgency, nocturia, hesitancy, discharge, hematuria or flank pain. Musculoskeletal: Denies arthralgias, myalgias, stiffness, jt. swelling, pain, limping or strain/sprain.  Skin: Denies pruritus, rash, hives, warts, acne, eczema or change in skin lesion(s). Neuro: No weakness, tremor, incoordination, spasms, paresthesia or pain. Psychiatric: Denies  confusion, memory loss or sensory loss. Endo: Denies change in weight, skin or hair change.  Heme/Lymph: No excessive bleeding, bruising or enlarged lymph nodes.  Physical Exam  BP 112/76   Pulse 60   Temp (!) 97.2 F (36.2 C)   Resp 16   Ht 5\' 11"  (1.803 m)   Wt 190 lb (86.2 kg)   BMI 26.50 kg/m    Appears  well nourished, well groomed  and in no distress.  Eyes: PERRLA, EOMs, conjunctiva no swelling or erythema. Sinuses: No frontal/maxillary tenderness ENT/Mouth: EAC's clear, TM's nl w/o erythema, bulging. Nares clear w/o erythema, swelling, exudates. Oropharynx clear without erythema or exudates. Oral hygiene is good. Tongue normal, non obstructing. Hearing intact.  Neck: Supple. Thyroid not palpable. Car 2+/2+ without bruits, nodes or JVD. Chest: Respirations nl with BS clear & equal w/o rales, rhonchi, wheezing or stridor.  Cor: Heart sounds normal w/ regular rate and rhythm without sig. murmurs, gallops, clicks or rubs. Peripheral pulses normal and equal  without edema.  Abdomen: Soft & bowel sounds normal. Non-tender w/o guarding, rebound, hernias, masses or organomegaly.  Lymphatics: Unremarkable.  Musculoskeletal: Full ROM all peripheral extremities, joint stability, 5/5 strength and normal gait.  Skin: Warm, dry without exposed rashes, lesions or ecchymosis apparent.  Neuro: Cranial nerves intact, reflexes equal bilaterally. Sensory-motor testing grossly intact. Tendon reflexes grossly intact.  Pysch: Alert & oriented x 3.  Insight and judgement nl & appropriate. No ideations.  Assessment and Plan:  1. Elevated BP without diagnosis of hypertension  - Monitor blood pressure at home.  - Continue DASH diet.  Reminder to go to the ER if any CP,  SOB, nausea, dizziness, severe HA, changes vision/speech.  - CBC with Differential/Platelet - Magnesium - TSH  2. Hyperlipidemia, mixed  - Continue diet/meds, exercise,& lifestyle modifications.  - Continue monitor periodic cholesterol/liver & renal functions   - Lipid panel - TSH  3. Vitamin D deficiency  - Continue supplementation.  - VITAMIN D 25 Hydroxy (Vit-D Deficiency, Fractures)  4. Abnormal glucose  - Continue diet, exercise  - Lifestyle modifications.  - Monitor appropriate labs.   5. Medication  management  - CBC with Differential/Platelet - Magnesium - Lipid panel - TSH - VITAMIN D 25 Hydroxy        Discussed  regular exercise, BP monitoring, weight control to achieve/maintain BMI less than 25 and discussed med and SE's. Recommended labs to assess and monitor clinical status with further disposition pending results of labs.  I discussed the assessment and treatment plan with the patient. The patient was provided an opportunity to ask questions and all were answered. The patient agreed with the plan and demonstrated an understanding of the instructions.  I provided over 30 minutes of exam, counseling, chart review and  complex critical decision making.   Kirtland Bouchard,  MD  To prevent harm (release of this note would result in harm to the life or physical safety of the patient or another).

## 2019-09-02 ENCOUNTER — Other Ambulatory Visit: Payer: Self-pay

## 2019-09-02 ENCOUNTER — Ambulatory Visit (INDEPENDENT_AMBULATORY_CARE_PROVIDER_SITE_OTHER): Payer: No Typology Code available for payment source | Admitting: Internal Medicine

## 2019-09-02 VITALS — BP 112/76 | HR 60 | Temp 97.2°F | Resp 16 | Ht 71.0 in | Wt 190.0 lb

## 2019-09-02 DIAGNOSIS — E559 Vitamin D deficiency, unspecified: Secondary | ICD-10-CM | POA: Diagnosis not present

## 2019-09-02 DIAGNOSIS — E782 Mixed hyperlipidemia: Secondary | ICD-10-CM | POA: Diagnosis not present

## 2019-09-02 DIAGNOSIS — Z79899 Other long term (current) drug therapy: Secondary | ICD-10-CM

## 2019-09-02 DIAGNOSIS — R7309 Other abnormal glucose: Secondary | ICD-10-CM | POA: Diagnosis not present

## 2019-09-02 DIAGNOSIS — R030 Elevated blood-pressure reading, without diagnosis of hypertension: Secondary | ICD-10-CM

## 2019-09-03 LAB — CBC WITH DIFFERENTIAL/PLATELET
Absolute Monocytes: 291 cells/uL (ref 200–950)
Basophils Absolute: 29 cells/uL (ref 0–200)
Basophils Relative: 0.7 %
Eosinophils Absolute: 70 cells/uL (ref 15–500)
Eosinophils Relative: 1.7 %
HCT: 39.6 % (ref 38.5–50.0)
Hemoglobin: 13.5 g/dL (ref 13.2–17.1)
Lymphs Abs: 1078 cells/uL (ref 850–3900)
MCH: 31.4 pg (ref 27.0–33.0)
MCHC: 34.1 g/dL (ref 32.0–36.0)
MCV: 92.1 fL (ref 80.0–100.0)
MPV: 9.7 fL (ref 7.5–12.5)
Monocytes Relative: 7.1 %
Neutro Abs: 2632 cells/uL (ref 1500–7800)
Neutrophils Relative %: 64.2 %
Platelets: 184 10*3/uL (ref 140–400)
RBC: 4.3 10*6/uL (ref 4.20–5.80)
RDW: 12.2 % (ref 11.0–15.0)
Total Lymphocyte: 26.3 %
WBC: 4.1 10*3/uL (ref 3.8–10.8)

## 2019-09-03 LAB — MAGNESIUM: Magnesium: 2.2 mg/dL (ref 1.5–2.5)

## 2019-09-03 LAB — TSH: TSH: 0.75 mIU/L (ref 0.40–4.50)

## 2019-09-03 LAB — LIPID PANEL
Cholesterol: 143 mg/dL (ref <200)
HDL: 55 mg/dL (ref >=40)
LDL Cholesterol (Calc): 63 mg/dL (calc)
Non-HDL Cholesterol (Calc): 88 mg/dL (calc) (ref <130)
Total CHOL/HDL Ratio: 2.6 (calc) (ref <5.0)
Triglycerides: 174 mg/dL — ABNORMAL HIGH (ref <150)

## 2019-09-03 LAB — VITAMIN D 25 HYDROXY (VIT D DEFICIENCY, FRACTURES): Vit D, 25-Hydroxy: 43 ng/mL (ref 30–100)

## 2019-10-15 ENCOUNTER — Encounter: Payer: Self-pay | Admitting: Internal Medicine

## 2019-11-16 ENCOUNTER — Inpatient Hospital Stay
Admit: 2019-11-16 | Discharge: 2019-11-17 | Disposition: A | Discharge status: Home / Self Care | Payer: PRIVATE HEALTH INSURANCE | Attending: Emergency Medicine

## 2019-11-16 DIAGNOSIS — S61211A Laceration without foreign body of left index finger without damage to nail, initial encounter: Secondary | ICD-10-CM

## 2019-11-16 NOTE — ED Provider Notes (Signed)
ED Provider Notes by Dolly Rias, PA at 11/16/19 2202                Author: Dolly Rias, PA  Service: --  Author Type: Physician Assistant       Filed: 11/18/19 0236  Date of Service: 11/16/19 2202  Status: Attested           Editor: Dolly Rias, PA (Physician Assistant)  Cosigner: Emeline Darling, MD at 11/22/19 0201            Procedure Orders        1. Wound Closure by Adhesive [[id number]] ordered by Dolly Rias, PA                         Attestation signed by Emeline Darling, MD at 11/22/19 0201          I was present in the department when the patient was seen and was immediately available for consultation.   Earna Coder. Thomasena Edis, MD                                 Patient distress primary after cutting his finger with a knife.      The history is provided by the patient.    Laceration    The incident occurred less than 1 hour ago. The laceration is 2 cm in size. The injury mechanism is a clean knife. Foreign body present: no. The pain is at a severity of 5/10. The  pain is mild. The pain has been constant since onset.  Pertinent negatives include no numbness,  no tingling, no weakness, no loss of motion, no coolness and no discoloration. The  patient's last tetanus shot was more than 10 years ago.            Past Medical History:        Diagnosis  Date         ?  CAD (coronary artery disease)               Past Surgical History:         Procedure  Laterality  Date          ?  HX ORTHOPAEDIC                 History reviewed. No pertinent family history.        Social History          Socioeconomic History         ?  Marital status:  MARRIED              Spouse name:  Not on file         ?  Number of children:  Not on file     ?  Years of education:  Not on file     ?  Highest education level:  Not on file       Occupational History        ?  Not on file       Social Needs         ?  Financial resource strain:  Not on file        ?  Food insecurity              Worry:   Not on file  Inability:  Not on file        ?  Transportation needs              Medical:  Not on file         Non-medical:  Not on file       Tobacco Use         ?  Smoking status:  Not on file       Substance and Sexual Activity         ?  Alcohol use:  Not on file     ?  Drug use:  Not on file     ?  Sexual activity:  Not on file       Lifestyle        ?  Physical activity              Days per week:  Not on file         Minutes per session:  Not on file         ?  Stress:  Not on file       Relationships        ?  Social Engineer, manufacturing systems on phone:  Not on file         Gets together:  Not on file         Attends religious service:  Not on file         Active member of club or organization:  Not on file         Attends meetings of clubs or organizations:  Not on file         Relationship status:  Not on file        ?  Intimate partner violence              Fear of current or ex partner:  Not on file         Emotionally abused:  Not on file         Physically abused:  Not on file         Forced sexual activity:  Not on file        Other Topics  Concern        ?  Not on file       Social History Narrative        ?  Not on file              ALLERGIES: Patient has no known allergies.      Review of Systems    Constitutional: Negative.  Negative for activity change and appetite change.    Eyes: Negative.     Respiratory: Negative.  Negative for cough and shortness of breath.     Cardiovascular: Negative.  Negative for chest pain.    Gastrointestinal: Negative.  Negative for abdominal pain.    Genitourinary: Negative.     Musculoskeletal: Negative.     Neurological: Negative for tingling, weakness and numbness.            Vitals:           11/16/19 1957  11/16/19 2130         BP:  (!) 154/78       Pulse:  68       Resp:  18       Temp:  98.8 ??F (37.1 ??C)  SpO2:  95%  95%     Weight:  83.9 kg (185 lb)           Height:  5\' 11"  (1.803 m)                  Physical Exam   Vitals signs and nursing  note reviewed.   Constitutional:        Appearance: Normal appearance. He is normal weight.   HENT :       Head: Normocephalic and atraumatic.   Eyes :       Conjunctiva/sclera: Conjunctivae normal.   Cardiovascular:       Rate and Rhythm: Normal rate and regular rhythm.      Pulses: Normal pulses.      Heart sounds: Normal heart sounds. No murmur. No friction rub. No gallop.    Pulmonary:       Effort: Pulmonary effort is normal. No respiratory distress.      Breath sounds: Normal breath sounds and  air entry. No stridor, decreased air movement or transmitted upper airway sounds. No decreased breath sounds, wheezing, rhonchi or rales.   Chest:       Chest wall: No tenderness.   Musculoskeletal : Normal range of motion.      Left hand: He exhibits tenderness and laceration . He exhibits normal two-point discrimination, normal capillary refill and no swelling. Decreased sensation noted. Decreased sensation  is present in the radial distribution. Decreased sensation is not present in the ulnar distribution and is not present in the medial redistribution. Normal strength noted.        Hands:         Comments: 2 cm straight laceration to pad of left index finger.  No signs of infection or contamination.  Neurovascularly intact  in bilateral upper extremities.  Signs of tendon injury.    Skin:      Findings: No erythema.   Neurological :       General: No focal deficit present.      Mental Status: He is alert and oriented to person, place, and time.    Psychiatric:         Mood and Affect: Mood normal.              MDM   Number of Diagnoses or Management Options   Laceration of left index finger without damage to nail, foreign body presence unspecified, initial encounter: new and requires workup   Diagnosis management comments: Laceration left index finger repaired with glue.  And 1 Steri-Strip.          Amount and/or Complexity of Data Reviewed   Discuss the patient with other providers: yes      Risk of Complications,  Morbidity, and/or Mortality   Presenting problems: moderate  Diagnostic procedures: low  Management options: low     Patient Progress   Patient progress: improved             Wound Closure by  Adhesive      Date/Time: 11/18/2019 2:35 AM   Performed by:  Casper Harrison, PA   Authorized by:  Casper Harrison, PA       Consent:      Consent obtained:  Verbal     Consent given by:  Patient     Risks discussed:  Poor cosmetic result     Alternatives discussed:  No treatment   Anesthesia (see MAR for exact dosages):  Anesthesia method:  None   Laceration details:      Location:  Finger     Finger location:  L index finger     Length (cm):  2     Depth (mm):  3   Repair type:      Repair type:  Simple   Pre-procedure details:      Preparation:  Patient was prepped and draped in usual sterile fashion   Exploration:     Wound exploration: wound explored through full range of motion and  entire depth of wound probed and visualized      Wound extent: no areolar tissue violation noted , no fascia violation noted, no foreign bodies/material noted , no muscle damage noted, no nerve damage noted , no tendon damage noted, no underlying fracture noted  and no vascular damage noted      Contaminated: no      Treatment:      Area cleansed with:  Saline     Amount of cleaning:  Standard     Irrigation solution:  Sterile saline     Irrigation volume:      Irrigation method:  Pressure wash, syringe and tap    Visualized foreign bodies/material removed:  no     Skin repair:      Repair method:  Tissue adhesive and Steri-Strips     Number of Steri-Strips:  1   Approximation:      Approximation:  Close   Post-procedure details:      Dressing:  Non-adherent dressing and bulky dressing     Patient tolerance of procedure:  Tolerated well, no immediate complications

## 2019-11-16 NOTE — ED Notes (Signed)

## 2019-11-16 NOTE — ED Notes (Signed)
Pt reports he was opening a new knife and it slipped lacerating the 2nd finger of the left hand. Unknown tetanus.

## 2019-11-16 NOTE — ED Provider Notes (Signed)
Patient distress primary after cutting his finger with a knife.    The history is provided by the patient.   Laceration   The incident occurred less than 1 hour ago. The laceration is 2 cm in size. The injury mechanism is a clean knife. Foreign body present: no. The pain is at a severity of 5/10. The pain is mild. The pain has been constant since onset. Pertinent negatives include no numbness, no tingling, no weakness, no loss of motion, no coolness and no discoloration. The patient's last tetanus shot was more than 10 years ago.       Past Medical History:   Diagnosis Date   ??? CAD (coronary artery disease)        Past Surgical History:   Procedure Laterality Date   ??? HX ORTHOPAEDIC           History reviewed. No pertinent family history.    Social History     Socioeconomic History   ??? Marital status: MARRIED     Spouse name: Not on file   ??? Number of children: Not on file   ??? Years of education: Not on file   ??? Highest education level: Not on file   Occupational History   ??? Not on file   Social Needs   ??? Financial resource strain: Not on file   ??? Food insecurity     Worry: Not on file     Inability: Not on file   ??? Transportation needs     Medical: Not on file     Non-medical: Not on file   Tobacco Use   ??? Smoking status: Not on file   Substance and Sexual Activity   ??? Alcohol use: Not on file   ??? Drug use: Not on file   ??? Sexual activity: Not on file   Lifestyle   ??? Physical activity     Days per week: Not on file     Minutes per session: Not on file   ??? Stress: Not on file   Relationships   ??? Social Product manager on phone: Not on file     Gets together: Not on file     Attends religious service: Not on file     Active member of club or organization: Not on file     Attends meetings of clubs or organizations: Not on file     Relationship status: Not on file   ??? Intimate partner violence     Fear of current or ex partner: Not on file     Emotionally abused: Not on file     Physically abused: Not on file      Forced sexual activity: Not on file   Other Topics Concern   ??? Not on file   Social History Narrative   ??? Not on file         ALLERGIES: Patient has no known allergies.    Review of Systems   Constitutional: Negative.  Negative for activity change and appetite change.   Eyes: Negative.    Respiratory: Negative.  Negative for cough and shortness of breath.    Cardiovascular: Negative.  Negative for chest pain.   Gastrointestinal: Negative.  Negative for abdominal pain.   Genitourinary: Negative.    Musculoskeletal: Negative.    Neurological: Negative for tingling, weakness and numbness.       Vitals:    11/16/19 1957 11/16/19 2130   BP: (!) 154/78  Pulse: 68    Resp: 18    Temp: 98.8 ??F (37.1 ??C)    SpO2: 95% 95%   Weight: 83.9 kg (185 lb)    Height: 5\' 11"  (1.803 m)             Physical Exam  Vitals signs and nursing note reviewed.   Constitutional:       Appearance: Normal appearance. He is normal weight.   HENT:      Head: Normocephalic and atraumatic.   Eyes:      Conjunctiva/sclera: Conjunctivae normal.   Cardiovascular:      Rate and Rhythm: Normal rate and regular rhythm.      Pulses: Normal pulses.      Heart sounds: Normal heart sounds. No murmur. No friction rub. No gallop.    Pulmonary:      Effort: Pulmonary effort is normal. No respiratory distress.      Breath sounds: Normal breath sounds and air entry. No stridor, decreased air movement or transmitted upper airway sounds. No decreased breath sounds, wheezing, rhonchi or rales.   Chest:      Chest wall: No tenderness.   Musculoskeletal: Normal range of motion.      Left hand: He exhibits tenderness and laceration. He exhibits normal two-point discrimination, normal capillary refill and no swelling. Decreased sensation noted. Decreased sensation is present in the radial distribution. Decreased sensation is not present in the ulnar distribution and is not present in the medial redistribution. Normal strength noted.        Hands:       Comments: 2 cm  straight laceration to pad of left index finger.  No signs of infection or contamination.  Neurovascularly intact in bilateral upper extremities.  Signs of tendon injury.   Skin:     Findings: No erythema.   Neurological:      General: No focal deficit present.      Mental Status: He is alert and oriented to person, place, and time.   Psychiatric:         Mood and Affect: Mood normal.          MDM  Number of Diagnoses or Management Options  Laceration of left index finger without damage to nail, foreign body presence unspecified, initial encounter: new and requires workup  Diagnosis management comments: Laceration left index finger repaired with glue.  And 1 Steri-Strip.       Amount and/or Complexity of Data Reviewed  Discuss the patient with other providers: yes    Risk of Complications, Morbidity, and/or Mortality  Presenting problems: moderate  Diagnostic procedures: low  Management options: low    Patient Progress  Patient progress: improved         Wound Closure by Adhesive    Date/Time: 11/18/2019 2:35 AM  Performed by: 01/18/2020, PA  Authorized by: Dolly Rias, PA     Consent:     Consent obtained:  Verbal    Consent given by:  Patient    Risks discussed:  Poor cosmetic result    Alternatives discussed:  No treatment  Anesthesia (see MAR for exact dosages):     Anesthesia method:  None  Laceration details:     Location:  Finger    Finger location:  L index finger    Length (cm):  2    Depth (mm):  3  Repair type:     Repair type:  Simple  Pre-procedure details:     Preparation:  Patient was prepped and draped in usual sterile fashion  Exploration:     Wound exploration: wound explored through full range of motion and entire depth of wound probed and visualized      Wound extent: no areolar tissue violation noted, no fascia violation noted, no foreign bodies/material noted, no muscle damage noted, no nerve damage noted, no tendon damage noted, no underlying fracture noted and no vascular  damage noted      Contaminated: no    Treatment:     Area cleansed with:  Saline    Amount of cleaning:  Standard    Irrigation solution:  Sterile saline    Irrigation volume:     Irrigation method:  Pressure wash, syringe and tap    Visualized foreign bodies/material removed: no    Skin repair:     Repair method:  Tissue adhesive and Steri-Strips    Number of Steri-Strips:  1  Approximation:     Approximation:  Close  Post-procedure details:     Dressing:  Non-adherent dressing and bulky dressing    Patient tolerance of procedure:  Tolerated well, no immediate complications

## 2019-11-16 NOTE — ED Notes (Signed)

## 2019-11-16 NOTE — ED Triage Notes (Signed)
Pt reports he was opening a new knife and it slipped lacerating the 2nd finger of the left hand. Unknown tetanus.

## 2019-11-17 MED ORDER — DIPHTH,PERTUS(AC)TETANUS VAC(PF) 2.5 LF UNIT-8 MCG-5 LF/0.5 ML INJ
INTRAMUSCULAR | Status: AC
Start: 2019-11-17 — End: 2019-11-16
  Administered 2019-11-17: 02:00:00 via INTRAMUSCULAR

## 2019-11-17 MED FILL — BOOSTRIX TDAP 2.5 LF UNIT-8 MCG-5 LF/0.5 ML INTRAMUSCULAR SUSPENSION: INTRAMUSCULAR | Qty: 0.5

## 2019-12-10 ENCOUNTER — Ambulatory Visit: Payer: No Typology Code available for payment source | Admitting: Adult Health Nurse Practitioner

## 2019-12-10 ENCOUNTER — Encounter: Payer: Self-pay | Admitting: Adult Health Nurse Practitioner

## 2019-12-10 ENCOUNTER — Other Ambulatory Visit: Payer: Self-pay

## 2019-12-10 VITALS — BP 110/58 | HR 55 | Temp 97.7°F | Ht 71.0 in | Wt 190.6 lb

## 2019-12-10 DIAGNOSIS — E782 Mixed hyperlipidemia: Secondary | ICD-10-CM | POA: Diagnosis not present

## 2019-12-10 DIAGNOSIS — R030 Elevated blood-pressure reading, without diagnosis of hypertension: Secondary | ICD-10-CM | POA: Diagnosis not present

## 2019-12-10 DIAGNOSIS — Z79899 Other long term (current) drug therapy: Secondary | ICD-10-CM

## 2019-12-10 DIAGNOSIS — Z6826 Body mass index (BMI) 26.0-26.9, adult: Secondary | ICD-10-CM

## 2019-12-10 DIAGNOSIS — E559 Vitamin D deficiency, unspecified: Secondary | ICD-10-CM | POA: Diagnosis not present

## 2019-12-10 LAB — CBC WITH DIFFERENTIAL/PLATELET
Absolute Monocytes: 303 cells/uL (ref 200–950)
Basophils Absolute: 21 cells/uL (ref 0–200)
Basophils Relative: 0.5 %
Eosinophils Absolute: 62 cells/uL (ref 15–500)
Eosinophils Relative: 1.5 %
HCT: 43.7 % (ref 38.5–50.0)
Hemoglobin: 15.1 g/dL (ref 13.2–17.1)
Lymphs Abs: 939 cells/uL (ref 850–3900)
MCH: 31.4 pg (ref 27.0–33.0)
MCHC: 34.6 g/dL (ref 32.0–36.0)
MCV: 90.9 fL (ref 80.0–100.0)
MPV: 9.8 fL (ref 7.5–12.5)
Monocytes Relative: 7.4 %
Neutro Abs: 2776 cells/uL (ref 1500–7800)
Neutrophils Relative %: 67.7 %
Platelets: 203 10*3/uL (ref 140–400)
RBC: 4.81 10*6/uL (ref 4.20–5.80)
RDW: 13.4 % (ref 11.0–15.0)
Total Lymphocyte: 22.9 %
WBC: 4.1 10*3/uL (ref 3.8–10.8)

## 2019-12-10 LAB — COMPLETE METABOLIC PANEL WITH GFR
AG Ratio: 2.1 (calc) (ref 1.0–2.5)
ALT: 16 U/L (ref 9–46)
AST: 14 U/L (ref 10–35)
Albumin: 4.4 g/dL (ref 3.6–5.1)
Alkaline phosphatase (APISO): 58 U/L (ref 35–144)
BUN: 11 mg/dL (ref 7–25)
CO2: 30 mmol/L (ref 20–32)
Calcium: 10.2 mg/dL (ref 8.6–10.3)
Chloride: 105 mmol/L (ref 98–110)
Creat: 0.83 mg/dL (ref 0.70–1.33)
GFR, Est African American: 114 mL/min/{1.73_m2} (ref >=60)
GFR, Est Non African American: 98 mL/min/{1.73_m2} (ref >=60)
Globulin: 2.1 g/dL (calc) (ref 1.9–3.7)
Glucose, Bld: 92 mg/dL (ref 65–99)
Potassium: 4.3 mmol/L (ref 3.5–5.3)
Sodium: 140 mmol/L (ref 135–146)
Total Bilirubin: 0.8 mg/dL (ref 0.2–1.2)
Total Protein: 6.5 g/dL (ref 6.1–8.1)

## 2019-12-10 LAB — LIPID PANEL
Cholesterol: 159 mg/dL (ref <200)
HDL: 66 mg/dL (ref >=40)
LDL Cholesterol (Calc): 73 mg/dL (calc)
Non-HDL Cholesterol (Calc): 93 mg/dL (calc) (ref <130)
Total CHOL/HDL Ratio: 2.4 (calc) (ref <5.0)
Triglycerides: 110 mg/dL (ref <150)

## 2019-12-10 NOTE — Progress Notes (Signed)
3 MONTH FOLLOW UP  Assessment and Plan:   Dave Rogers was seen today for follow-up.  Diagnoses and all orders for this visit:    Elevated BP without diagnosis of hypertension -     CBC with Differential/Platelet -     COMPLETE METABOLIC PANEL WITH GFR  BMI 26.0-26.9,adult  Vitamin D deficiency Continue supplementation to maintain goal of 60-100 Taking Vitamin D 4,000 IU daily  Hyperlipidemia, mixed At LDL goal <100 on rosuvastatin 20 mg every other day  Trigs remain modestly elevated; historically with very mild elevations only on non-fasting labs, suggested addition of omega 3 supplement/fish oil or fenofibrate if trending up Continue low cholesterol diet and exercise.  Check lipid panel.   Elevated B/P w/o diagnosis of HTN Monitor blood pressure at home; call if consistently over 130/80 Continue DASH diet.   Reminder to go to the ER if any CP, SOB, nausea, dizziness, severe HA, changes vision/speech, left arm numbness and tingling and jaw pain.  BMI 26.0-26.9 Long discussion about weight loss, diet, and exercise Recommended diet heavy in fruits and veggies and low in animal meats, cheeses, and dairy products, appropriate calorie intake Discussed ideal weight for height  Will follow up in 3 months  Vitamin D Def He has been taking more consistently  Defer Vit D level to next OV  Medication Management Continued  Continue diet and meds as discussed. Further disposition pending results of labs. Discussed med's effects and SE's.   Over 30 minutes of face to face interview, exam, counseling, chart review, and critical decision making was performed.   Future Appointments  Date Time Provider Department Center  03/30/2020  3:00 PM Lucky Cowboy, MD [facility] None    ----------------------------------------------------------------------------------------------------------------------  HPI 59 y.o. male  presents for 3 month follow up on cholesterol, weight and vitamin D  deficiency.  Reports overall he is doing well.  He has not health or medication concerns today.  Reports he is active to the gym 2-3 days a week.  He also reports he walks the dog every day.  Has history of shoulder replacement surgery in Jan. And has been increasing his activity since then.      BMI is Body mass index is 26.58 kg/m., he has been working on diet and exercise, he is avoiding red meat. He is limiting processed food as well. He walks every other day, lifts weight, back in the gym since last month.  Wt Readings from Last 3 Encounters:  12/10/19 190 lb 9.6 oz (86.5 kg)  09/02/19 190 lb (86.2 kg)  07/29/19 193 lb 4.8 oz (87.7 kg)   Today their BP is BP: (!) 110/58  He does workout. He denies chest pain, shortness of breath, dizziness.   He is on cholesterol medication Rosuvastatin 20 mg every other day and denies myalgias. His LDL cholesterol is at goal. Trigs remain moderately elevated. The cholesterol last visit was:   Lab Results  Component Value Date   CHOL 143 09/02/2019   HDL 55 09/02/2019   LDLCALC 63 09/02/2019   TRIG 174 (H) 09/02/2019   CHOLHDL 2.6 09/02/2019   Patient is on Vitamin D supplement,   Lab Results  Component Value Date   VD25OH 43 09/02/2019       Current Medications:  Current Outpatient Medications on File Prior to Visit  Medication Sig  . Ascorbic Acid (VITAMIN C PO) Take 1 tablet by mouth daily.  Marland Kitchen b complex vitamins tablet Take 1 tablet by mouth daily.  . rosuvastatin (  CRESTOR) 40 MG tablet Take 1 tablet Daily for Cholesterol  . VITAMIN D PO Take 1,000 Units by mouth daily. Takes 4 capsules daily   No current facility-administered medications on file prior to visit.     Allergies: No Known Allergies   Medical History:  Past Medical History:  Diagnosis Date  . Arthritis   . Hyperlipidemia    Family history- Reviewed and unchanged Social history- Reviewed and unchanged   [facility] [place] 10/2019 Shingles vaccination:   Shingrix discussed with patient. Tdap - 11/16/2019 Colonoscopy- 10 yeas   Review of Systems:  ROS    Physical Exam: BP (!) 110/58   Pulse (!) 55   Temp 97.7 F (36.5 C)   Ht 5\' 11"  (1.803 m)   Wt 190 lb 9.6 oz (86.5 kg)   SpO2 96%   BMI 26.58 kg/m  Wt Readings from Last 3 Encounters:  12/10/19 190 lb 9.6 oz (86.5 kg)  09/02/19 190 lb (86.2 kg)  07/29/19 193 lb 4.8 oz (87.7 kg)   General Appearance: Well nourished, in no apparent distress. Eyes: PERRLA, conjunctiva no swelling or erythema Sinuses: No Frontal/maxillary tenderness ENT/Mouth: Ext aud canals clear, TMs without erythema, bulging. No erythema, swelling, or exudate on post pharynx.  Tonsils not swollen or erythematous. Hearing normal.  Neck: Supple, thyroid normal.  Respiratory: Respiratory effort normal, BS equal bilaterally without rales, rhonchi, wheezing or stridor.  Cardio: RRR with no MRGs. Brisk peripheral pulses without edema.  Abdomen: Soft, + BS.  Non tender, no guarding, rebound, hernias, masses. Lymphatics: Non tender without lymphadenopathy.  Musculoskeletal: Full ROM, 5/5 strength, Normal gait Skin: Warm, dry without rashes, lesions, ecchymosis.  Neuro: Cranial nerves intact. No cerebellar symptoms.  Psych: Awake and oriented X 3, normal affect, Insight and Judgment appropriate.    Dave Sierras, NP 4:43 PM [facility]

## 2019-12-12 ENCOUNTER — Encounter: Payer: No Typology Code available for payment source | Admitting: Internal Medicine

## 2019-12-16 NOTE — Patient Instructions (Signed)
declined

## 2020-03-30 ENCOUNTER — Encounter: Payer: No Typology Code available for payment source | Admitting: Internal Medicine

## 2020-08-18 ENCOUNTER — Encounter: Payer: Self-pay | Admitting: Internal Medicine

## 2020-08-18 ENCOUNTER — Ambulatory Visit (INDEPENDENT_AMBULATORY_CARE_PROVIDER_SITE_OTHER): Payer: No Typology Code available for payment source | Admitting: Internal Medicine

## 2020-08-18 ENCOUNTER — Other Ambulatory Visit: Payer: Self-pay

## 2020-08-18 VITALS — BP 112/74 | HR 64 | Temp 97.5°F | Resp 16 | Ht 71.0 in | Wt 192.2 lb

## 2020-08-18 DIAGNOSIS — N401 Enlarged prostate with lower urinary tract symptoms: Secondary | ICD-10-CM

## 2020-08-18 DIAGNOSIS — Z1329 Encounter for screening for other suspected endocrine disorder: Secondary | ICD-10-CM | POA: Diagnosis not present

## 2020-08-18 DIAGNOSIS — Z130 Encounter for screening for diseases of the blood and blood-forming organs and certain disorders involving the immune mechanism: Secondary | ICD-10-CM | POA: Diagnosis not present

## 2020-08-18 DIAGNOSIS — Z111 Encounter for screening for respiratory tuberculosis: Secondary | ICD-10-CM

## 2020-08-18 DIAGNOSIS — E559 Vitamin D deficiency, unspecified: Secondary | ICD-10-CM | POA: Diagnosis not present

## 2020-08-18 DIAGNOSIS — Z125 Encounter for screening for malignant neoplasm of prostate: Secondary | ICD-10-CM | POA: Diagnosis not present

## 2020-08-18 DIAGNOSIS — Z13220 Encounter for screening for lipoid disorders: Secondary | ICD-10-CM | POA: Diagnosis not present

## 2020-08-18 DIAGNOSIS — Z131 Encounter for screening for diabetes mellitus: Secondary | ICD-10-CM

## 2020-08-18 DIAGNOSIS — R030 Elevated blood-pressure reading, without diagnosis of hypertension: Secondary | ICD-10-CM | POA: Diagnosis not present

## 2020-08-18 DIAGNOSIS — Z0000 Encounter for general adult medical examination without abnormal findings: Secondary | ICD-10-CM | POA: Diagnosis not present

## 2020-08-18 DIAGNOSIS — Z136 Encounter for screening for cardiovascular disorders: Secondary | ICD-10-CM

## 2020-08-18 DIAGNOSIS — Z1211 Encounter for screening for malignant neoplasm of colon: Secondary | ICD-10-CM

## 2020-08-18 DIAGNOSIS — Z1389 Encounter for screening for other disorder: Secondary | ICD-10-CM

## 2020-08-18 DIAGNOSIS — Z79899 Other long term (current) drug therapy: Secondary | ICD-10-CM | POA: Diagnosis not present

## 2020-08-18 DIAGNOSIS — R350 Frequency of micturition: Secondary | ICD-10-CM

## 2020-08-18 DIAGNOSIS — R7309 Other abnormal glucose: Secondary | ICD-10-CM

## 2020-08-18 DIAGNOSIS — I700 Atherosclerosis of aorta: Secondary | ICD-10-CM

## 2020-08-18 DIAGNOSIS — I2510 Atherosclerotic heart disease of native coronary artery without angina pectoris: Secondary | ICD-10-CM

## 2020-08-18 DIAGNOSIS — E782 Mixed hyperlipidemia: Secondary | ICD-10-CM

## 2020-08-18 DIAGNOSIS — R5383 Other fatigue: Secondary | ICD-10-CM

## 2020-08-18 DIAGNOSIS — Z0001 Encounter for general adult medical examination with abnormal findings: Secondary | ICD-10-CM

## 2020-08-18 NOTE — Patient Instructions (Signed)

## 2020-08-18 NOTE — Progress Notes (Signed)
Annual  Screening/Preventative Visit  & Comprehensive Evaluation & Examination      This very nice 60 y.o. MWM presents for a Screening /Preventative Visit & comprehensive evaluation and management of multiple medical co-morbidities.  Patient has been followed  Expectantly for elevated BP, HLD, Prediabetes and Vitamin D Deficiency. Aortic Atherosclerosis  & Coronary artery calcifications were noted on Chest CT scan on 07/30/2019.      Patient is monitored expectantly for elevated BP. Patient's BP has been controlled at home.  Today's BP is at goal - 112/74. Patient denies any cardiac symptoms as chest pain, palpitations, shortness of breath, dizziness or ankle swelling.      Patient's hyperlipidemia is controlled with diet and Rosuvastatin. Patient denies myalgias or other medication SE's. Last lipids were at goal:  Lab Results  Component Value Date   CHOL 159 12/10/2019   HDL 66 12/10/2019   LDLCALC 73 12/10/2019   TRIG 110 12/10/2019   CHOLHDL 2.4 12/10/2019       Patient is monitored expectantly for abnormal glucose / glucose intolerance and patient denies reactive hypoglycemic symptoms, visual blurring, diabetic polys or paresthesias. Last A1c was normal & at goal:   Lab Results  Component Value Date   HGBA1C 5.0 02/19/2019         Finally, patient has history of Vitamin D Deficiency ("19" /2019) and last vitamin D was still low:   Lab Results  Component Value Date   VD25OH 25 09/02/2019    Current Outpatient Medications on File Prior to Visit  Medication Sig  . VITAMIN C  Take 1 tablet  daily.  Marland Kitchen b complex vitamins  Take 1 tablet daily.  . rosuvastatin  40 MG  Take 1 tablet Daily for Cholesterol  . VITAMIN D 1,000 Units Takes 4 capsules daily    No Known Allergies  Past Medical History:  Diagnosis Date  . Arthritis   . Hyperlipidemia    Health Maintenance  Topic Date Due  . Hepatitis C Screening  Never done  . COVID-19 Vaccine (1) Never done  . HIV  Screening  Never done  . INFLUENZA VACCINE  Never done  . COLONOSCOPY (Pts 45-66yrs Insurance coverage will need to be confirmed)  02/16/2023  . TETANUS/TDAP  11/15/2029   Immunization History  Administered Date(s) Administered  . PPD Test 02/19/2019  . Tdap 11/16/2019    Last Colon - 2014 in [place] - & was advised 10 yr f/u due 2024   Past Surgical History:  Procedure Laterality Date  . ANAL FISSURE REPAIR  2006  . HERNIA REPAIR Right 2006  . TOTAL SHOULDER ARTHROPLASTY Right 08/01/2019   Procedure: TOTAL SHOULDER ARTHROPLASTY;  Surgeon: Jones Broom, MD;  Location: [facility];  Service: Orthopedics;  Laterality: Right;   Family History  Problem Relation Age of Onset  . Dementia Mother   . Cancer Father   . Dementia Father    Social History   Socioeconomic History  . Marital status: Married    Spouse name: Victorino Dike  . Number of children: 1 daughter  Occupational History  . Works in Insurance account manager in [employer]  Tobacco Use  . Smoking status: Never Smoker  . Smokeless tobacco: Never Used  Vaping Use  . Vaping Use: Never used  Substance and Sexual Activity  . Alcohol use: Yes    Alcohol/week: 6.0 standard drinks    Types: 3 Glasses of wine, 3 Cans of beer per week    Comment: ocassional  . Drug  use: No  . Sexual activity: Yes    ROS Constitutional: Denies fever, chills, weight loss/gain, headaches, insomnia,  night sweats or change in appetite. Does c/o fatigue. Eyes: Denies redness, blurred vision, diplopia, discharge, itchy or watery eyes.  ENT: Denies discharge, congestion, post nasal drip, epistaxis, sore throat, earache, hearing loss, dental pain, Tinnitus, Vertigo, Sinus pain or snoring.  Cardio: Denies chest pain, palpitations, irregular heartbeat, syncope, dyspnea, diaphoresis, orthopnea, PND, claudication or edema Respiratory: denies cough, dyspnea, DOE, pleurisy, hoarseness, laryngitis or wheezing.  Gastrointestinal: Denies dysphagia,  heartburn, reflux, water brash, pain, cramps, nausea, vomiting, bloating, diarrhea, constipation, hematemesis, melena, hematochezia, jaundice or hemorrhoids Genitourinary: Denies dysuria, frequency, urgency, nocturia, hesitancy, discharge, hematuria or flank pain Musculoskeletal: Denies arthralgia, myalgia, stiffness, Jt. Swelling, pain, limp or strain/sprain. Denies Falls. Skin: Denies puritis, rash, hives, warts, acne, eczema or change in skin lesion Neuro: No weakness, tremor, incoordination, spasms, paresthesia or pain Psychiatric: Denies confusion, memory loss or sensory loss. Denies Depression. Endocrine: Denies change in weight, skin, hair change, nocturia, and paresthesia, diabetic polys, visual blurring or hyper / hypo glycemic episodes.  Heme/Lymph: No excessive bleeding, bruising or enlarged lymph nodes.  Physical Exam  BP 112/74   Pulse 64   Temp (!) 97.5 F (36.4 C)   Resp 16   Ht 5\' 11"  (1.803 m)   Wt 192 lb 3.2 oz (87.2 kg)   SpO2 98%   BMI 26.81 kg/m   General Appearance: Well nourished and well groomed and in no apparent distress.  Eyes: PERRLA, EOMs, conjunctiva no swelling or erythema, normal fundi and vessels. Sinuses: No frontal/maxillary tenderness ENT/Mouth: EACs patent / TMs  nl. Nares clear without erythema, swelling, mucoid exudates. Oral hygiene is good. No erythema, swelling, or exudate. Tongue normal, non-obstructing. Tonsils not swollen or erythematous. Hearing normal.  Neck: Supple, thyroid not palpable. No bruits, nodes or JVD. Respiratory: Respiratory effort normal.  BS equal and clear bilateral without rales, rhonci, wheezing or stridor. Cardio: Heart sounds are normal with regular rate and rhythm and no murmurs, rubs or gallops. Peripheral pulses are normal and equal bilaterally without edema. No aortic or femoral bruits. Chest: symmetric with normal excursions and percussion.  Abdomen: Soft, with Nl bowel sounds. Nontender, no guarding, rebound,  hernias, masses, or organomegaly.  Lymphatics: Non tender without lymphadenopathy.  Musculoskeletal: Full ROM all peripheral extremities, joint stability, 5/5 strength, and normal gait. Skin: Warm and dry without rashes, lesions, cyanosis, clubbing or  ecchymosis.  Neuro: Cranial nerves intact, reflexes equal bilaterally. Normal muscle tone, no cerebellar symptoms. Sensation intact.  Pysch: Alert and oriented X 3 with normal affect, insight and judgment appropriate.   Assessment and Plan  1. Annual Preventative/Screening Exam    2. Elevated BP without diagnosis of hypertension  - EKG 12-Lead - , RETROPERITNL ABD,  LTD - CBC with Differential/Platelet - COMPLETE METABOLIC PANEL WITH GFR - Magnesium - TSH  3. Hyperlipidemia, mixed  - EKG 12-Lead - [place], RETROPERITNL ABD,  LTD - Lipid panel - TSH  4. Abnormal glucose  - EKG 12-Lead - [place], RETROPERITNL ABD,  LTD - Hemoglobin A1c - Insulin, random  5. Vitamin D deficiency  - VITAMIN D 25 Hydroxy  6. Aortic atherosclerosis (HCC)  - EKG 12-Lead - [place], RETROPERITNL ABD,  LTD  7. Coronary artery calcification   8. Screening-pulmonary TB  - TB Skin Test  9. Screening for colorectal cancer  - POC Hemoccult Bld/Stl   10. Prostate cancer screening  - PSA  11. Screening for ischemic  heart disease  - EKG 12-Lead  12. Screening for AAA (aortic abdominal aneurysm)  - [place], RETROPERITNL ABD,  LTD  13. Fatigue, unspecified type  - Iron,Total/Total Iron Binding Cap - Vitamin B12 - PSA - Testosterone - CBC with Differential/Platelet - TSH  14. Medication management  - Urinalysis, Routine w reflex microscopic - Microalbumin / creatinine urine ratio - CBC with Differential/Platelet - COMPLETE METABOLIC PANEL WITH GFR - Magnesium - Lipid panel - TSH - Hemoglobin A1c - Insulin, random - VITAMIN D 25 Hydroxy          Patient was counseled in prudent diet, weight control to achieve/maintain BMI less than  25, BP monitoring, regular exercise and medications as discussed.  Discussed med effects and SE's. Routine screening labs and tests as requested with regular follow-up as recommended. Over 40 minutes of exam, counseling, chart review and high complex critical decision making was performed   Marinus Maw, MD

## 2020-08-19 LAB — COMPLETE METABOLIC PANEL WITH GFR
AG Ratio: 2.1 (calc) (ref 1.0–2.5)
ALT: 16 U/L (ref 9–46)
AST: 19 U/L (ref 10–35)
Albumin: 4.5 g/dL (ref 3.6–5.1)
Alkaline phosphatase (APISO): 64 U/L (ref 35–144)
BUN: 12 mg/dL (ref 7–25)
CO2: 26 mmol/L (ref 20–32)
Calcium: 10.8 mg/dL — ABNORMAL HIGH (ref 8.6–10.3)
Chloride: 105 mmol/L (ref 98–110)
Creat: 0.7 mg/dL (ref 0.70–1.33)
GFR, Est African American: 121 mL/min/{1.73_m2} (ref >=60)
GFR, Est Non African American: 105 mL/min/{1.73_m2} (ref >=60)
Globulin: 2.1 g/dL (calc) (ref 1.9–3.7)
Glucose, Bld: 95 mg/dL (ref 65–99)
Potassium: 4.2 mmol/L (ref 3.5–5.3)
Sodium: 137 mmol/L (ref 135–146)
Total Bilirubin: 0.6 mg/dL (ref 0.2–1.2)
Total Protein: 6.6 g/dL (ref 6.1–8.1)

## 2020-08-19 LAB — CBC WITH DIFFERENTIAL/PLATELET
Absolute Monocytes: 258 cells/uL (ref 200–950)
Basophils Absolute: 19 cells/uL (ref 0–200)
Basophils Relative: 0.5 %
Eosinophils Absolute: 38 cells/uL (ref 15–500)
Eosinophils Relative: 1 %
HCT: 43.1 % (ref 38.5–50.0)
Hemoglobin: 15 g/dL (ref 13.2–17.1)
Lymphs Abs: 802 cells/uL — ABNORMAL LOW (ref 850–3900)
MCH: 31.6 pg (ref 27.0–33.0)
MCHC: 34.8 g/dL (ref 32.0–36.0)
MCV: 90.9 fL (ref 80.0–100.0)
MPV: 9.8 fL (ref 7.5–12.5)
Monocytes Relative: 6.8 %
Neutro Abs: 2683 cells/uL (ref 1500–7800)
Neutrophils Relative %: 70.6 %
Platelets: 216 10*3/uL (ref 140–400)
RBC: 4.74 10*6/uL (ref 4.20–5.80)
RDW: 12.1 % (ref 11.0–15.0)
Total Lymphocyte: 21.1 %
WBC: 3.8 10*3/uL (ref 3.8–10.8)

## 2020-08-19 LAB — IRON, TOTAL/TOTAL IRON BINDING CAP
%SAT: 36 % (calc) (ref 20–48)
Iron: 123 ug/dL (ref 50–180)
TIBC: 345 mcg/dL (calc) (ref 250–425)

## 2020-08-19 LAB — URINALYSIS, ROUTINE W REFLEX MICROSCOPIC
Bilirubin Urine: NEGATIVE
Glucose, UA: NEGATIVE
Hgb urine dipstick: NEGATIVE
Ketones, ur: NEGATIVE
Leukocytes,Ua: NEGATIVE
Nitrite: NEGATIVE
Protein, ur: NEGATIVE
Specific Gravity, Urine: 1.003 (ref 1.001–1.03)
pH: 7.5 (ref 5.0–8.0)

## 2020-08-19 LAB — LIPID PANEL
Cholesterol: 173 mg/dL (ref <200)
HDL: 59 mg/dL (ref >=40)
LDL Cholesterol (Calc): 91 mg/dL (calc)
Non-HDL Cholesterol (Calc): 114 mg/dL (calc) (ref <130)
Total CHOL/HDL Ratio: 2.9 (calc) (ref <5.0)
Triglycerides: 124 mg/dL (ref <150)

## 2020-08-19 LAB — HEMOGLOBIN A1C
Hgb A1c MFr Bld: 5 % of total Hgb (ref <5.7)
Mean Plasma Glucose: 97 mg/dL
eAG (mmol/L): 5.4 mmol/L

## 2020-08-19 LAB — MICROALBUMIN / CREATININE URINE RATIO
Creatinine, Urine: 11 mg/dL — ABNORMAL LOW (ref 20–320)
Microalb, Ur: <0.2 mg/dL

## 2020-08-19 LAB — INSULIN, RANDOM: Insulin: 3.6 u[IU]/mL

## 2020-08-19 LAB — MAGNESIUM: Magnesium: 2.2 mg/dL (ref 1.5–2.5)

## 2020-08-19 LAB — VITAMIN B12: Vitamin B-12: 457 pg/mL (ref 200–1100)

## 2020-08-19 LAB — PSA: PSA: 2.99 ng/mL (ref <=4.0)

## 2020-08-19 LAB — VITAMIN D 25 HYDROXY (VIT D DEFICIENCY, FRACTURES): Vit D, 25-Hydroxy: 37 ng/mL (ref 30–100)

## 2020-08-19 LAB — TSH: TSH: 1.34 mIU/L (ref 0.40–4.50)

## 2020-08-19 LAB — TESTOSTERONE: Testosterone: 456 ng/dL (ref 250–827)

## 2020-08-19 NOTE — Progress Notes (Signed)
========================================================== -   Test results slightly outside the reference range are not unusual. If there is anything important, I will review this with you,  otherwise it is considered normal test values.  If you have further questions,  please do not hesitate to contact me at the office or via My Chart.  ========================================================== ==========================================================  - Iron & Vitamin B12 levels - Both Normal & OK   ========================================================== ==========================================================  - PSA - Low - Great   ========================================================== ==========================================================  - Testosterone Level - Normal   ========================================================== ==========================================================  - Total chol = 173  and LDL Chol = 91   -  Both  Excellent   - Very low risk for Heart Attack  / Stroke  ========================================================== ==========================================================  - A1c - Normal - Great - No Diabetes  !  ========================================================== ==========================================================  - Vitamin D = 37 - Very Low (bad)   - Vitamin D goal is between 70-100.   - Please INCREASE your Vitamin D up to 10,000 units Daily  ( Every Day  ! )   - It is very important as a natural anti-inflammatory and helping the  immune system protect against viral infections, like the Covid-19    helping hair, skin, and nails, as well as reducing stroke and  heart attack risk.   - It helps your bones and helps with mood.  - It also decreases numerous cancer risks so please  take it as directed.   - Low Vit D is associated with a 200-300% higher risk for  CANCER   and 200-300% higher risk  for HEART   ATTACK  &  STROKE.    - It is also associated with higher death rate at younger ages,   autoimmune diseases like Rheumatoid arthritis, Lupus,  Multiple Sclerosis.     - Also many other serious conditions, like depression, Alzheimer's  Dementia, infertility, muscle aches, fatigue, fibromyalgia   - just to name a few.  ========================================================== ==========================================================  - All Else - CBC - Kidneys - Electrolytes - Liver - Magnesium & Thyroid    - all  Normal / OK ===========================================================  ===========================================================

## 2020-08-25 ENCOUNTER — Other Ambulatory Visit: Payer: Self-pay | Admitting: Internal Medicine

## 2020-08-25 DIAGNOSIS — E782 Mixed hyperlipidemia: Secondary | ICD-10-CM

## 2021-02-15 ENCOUNTER — Ambulatory Visit: Payer: No Typology Code available for payment source | Admitting: Adult Health

## 2021-02-22 NOTE — Progress Notes (Deleted)
FOLLOW UP  Assessment and Plan:   Atherosclerosis of aorta (HCC) CT 07/2019 Control blood pressure, cholesterol, glucose, increase exercise.   Cholesterol At LDL goal <100 on rosuvastatin 20 mg every other day  Trigs remain modestly elevated; historically with very mild elevations only on non-fasting labs, suggested addition of omega 3 supplement/fish oil or fenofibrate if trending up Continue low cholesterol diet and exercise.  Check lipid panel.   Overweight Long discussion about weight loss, diet, and exercise Recommended diet heavy in fruits and veggies and low in animal meats, cheeses, and dairy products, appropriate calorie intake Discussed ideal weight for height  Will follow up in 3 months  Vitamin D Def He has been taking more consistently  Check Vit D level ***  Continue diet and meds as discussed. Further disposition pending results of labs. Discussed med's effects and SE's.   Over 30 minutes of exam, counseling, chart review, and critical decision making was performed.   Future Appointments  Date Time Provider Department Center  02/24/2021 11:00 AM Judd Gaudier, NP [facility] None  08/26/2021  9:00 AM Lucky Cowboy, MD [facility] None    ----------------------------------------------------------------------------------------------------------------------  HPI 60 y.o. male  presents for 3 month follow up on cholesterol, weight and vitamin D deficiency.   BMI is There is no height or weight on file to calculate BMI., he has been working on diet and exercise, he is avoiding red meat. He is limiting processed food as well. He walks every other day, lifts weight, back in the gym since last month.  Wt Readings from Last 3 Encounters:  08/18/20 192 lb 3.2 oz (87.2 kg)  12/10/19 190 lb 9.6 oz (86.5 kg)  09/02/19 190 lb (86.2 kg)   Today their BP is    He does workout. He denies chest pain, shortness of breath, dizziness.  He has coronary artery calcifications and  aortic atherosclerosis per CT 07/2019.    He is on cholesterol medication Rosuvastatin 20 mg every other day and denies myalgias. His LDL cholesterol is at goal. The cholesterol last visit was:   Lab Results  Component Value Date   CHOL 173 08/18/2020   HDL 59 08/18/2020   LDLCALC 91 08/18/2020   TRIG 124 08/18/2020   CHOLHDL 2.9 08/18/2020   Patient is on Vitamin D supplement,   Lab Results  Component Value Date   VD25OH 37 08/18/2020     He is on B12 complex daily.  Lab Results  Component Value Date   VITAMINB12 457 08/18/2020     Current Medications:  Current Outpatient Medications on File Prior to Visit  Medication Sig   Ascorbic Acid (VITAMIN C PO) Take 1 tablet by mouth daily.   b complex vitamins tablet Take 1 tablet by mouth daily.   rosuvastatin (CRESTOR) 40 MG tablet Take  1 tablet  Daily  for Cholesterol   VITAMIN D PO Take 1,000 Units by mouth daily. Takes 4 capsules daily   No current facility-administered medications on file prior to visit.     Allergies: No Known Allergies   Medical History:  Past Medical History:  Diagnosis Date   Arthritis    Hyperlipidemia    Family history- Reviewed and unchanged Social history- Reviewed and unchanged   Review of Systems:  Review of Systems  Constitutional:  Negative for malaise/fatigue and weight loss.  HENT:  Negative for hearing loss and tinnitus.   Eyes:  Negative for blurred vision and double vision.  Respiratory:  Negative for cough, shortness of  breath and wheezing.   Cardiovascular:  Negative for chest pain, palpitations, orthopnea, claudication and leg swelling.  Gastrointestinal:  Negative for abdominal pain, blood in stool, constipation, diarrhea, heartburn, melena, nausea and vomiting.  Genitourinary: Negative.   Musculoskeletal:  Negative for joint pain and myalgias.  Skin:  Negative for rash.  Neurological:  Negative for dizziness, tingling, sensory change, weakness and headaches.   Endo/Heme/Allergies:  Negative for polydipsia.  Psychiatric/Behavioral: Negative.    All other systems reviewed and are negative.    Physical Exam: There were no vitals taken for this visit. Wt Readings from Last 3 Encounters:  08/18/20 192 lb 3.2 oz (87.2 kg)  12/10/19 190 lb 9.6 oz (86.5 kg)  09/02/19 190 lb (86.2 kg)   General Appearance: Well nourished, in no apparent distress. Eyes: PERRLA, conjunctiva no swelling or erythema Sinuses: No Frontal/maxillary tenderness ENT/Mouth: Ext aud canals clear, TMs without erythema, bulging. No erythema, swelling, or exudate on post pharynx.  Tonsils not swollen or erythematous. Hearing normal.  Neck: Supple, thyroid normal.  Respiratory: Respiratory effort normal, BS equal bilaterally without rales, rhonchi, wheezing or stridor.  Cardio: RRR with no MRGs. Brisk peripheral pulses without edema.  Abdomen: Soft, + BS.  Non tender, no guarding, rebound, hernias, masses. Lymphatics: Non tender without lymphadenopathy.  Musculoskeletal: Full ROM, 5/5 strength, Normal gait Skin: Warm, dry without rashes, lesions, ecchymosis.  Neuro: Cranial nerves intact. No cerebellar symptoms.  Psych: Awake and oriented X 3, normal affect, Insight and Judgment appropriate.    Dan Maker, NP 8:44 AM [facility]

## 2021-02-24 ENCOUNTER — Ambulatory Visit: Payer: No Typology Code available for payment source | Admitting: Adult Health

## 2021-02-24 DIAGNOSIS — E559 Vitamin D deficiency, unspecified: Secondary | ICD-10-CM

## 2021-02-24 DIAGNOSIS — I2510 Atherosclerotic heart disease of native coronary artery without angina pectoris: Secondary | ICD-10-CM

## 2021-02-24 DIAGNOSIS — E782 Mixed hyperlipidemia: Secondary | ICD-10-CM

## 2021-02-24 DIAGNOSIS — Z6826 Body mass index (BMI) 26.0-26.9, adult: Secondary | ICD-10-CM

## 2021-02-24 DIAGNOSIS — Z79899 Other long term (current) drug therapy: Secondary | ICD-10-CM

## 2021-02-24 DIAGNOSIS — I700 Atherosclerosis of aorta: Secondary | ICD-10-CM

## 2021-04-05 ENCOUNTER — Encounter: Payer: No Typology Code available for payment source | Admitting: Internal Medicine

## 2021-08-10 ENCOUNTER — Inpatient Hospital Stay (HOSPITAL_BASED_OUTPATIENT_CLINIC_OR_DEPARTMENT_OTHER)
Admission: EM | Admit: 2021-08-10 | Discharge: 2021-08-12 | DRG: 287 | Disposition: A | Discharge status: Home / Self Care | Payer: PRIVATE HEALTH INSURANCE | Attending: Internal Medicine | Admitting: Internal Medicine

## 2021-08-10 ENCOUNTER — Encounter (HOSPITAL_BASED_OUTPATIENT_CLINIC_OR_DEPARTMENT_OTHER): Payer: Self-pay

## 2021-08-10 ENCOUNTER — Other Ambulatory Visit: Payer: Self-pay

## 2021-08-10 ENCOUNTER — Ambulatory Visit: Payer: No Typology Code available for payment source | Admitting: Internal Medicine

## 2021-08-10 ENCOUNTER — Emergency Department (HOSPITAL_BASED_OUTPATIENT_CLINIC_OR_DEPARTMENT_OTHER): Payer: PRIVATE HEALTH INSURANCE

## 2021-08-10 DIAGNOSIS — I200 Unstable angina: Secondary | ICD-10-CM | POA: Diagnosis present

## 2021-08-10 DIAGNOSIS — R079 Chest pain, unspecified: Secondary | ICD-10-CM | POA: Diagnosis present

## 2021-08-10 DIAGNOSIS — Z96611 Presence of right artificial shoulder joint: Secondary | ICD-10-CM | POA: Diagnosis present

## 2021-08-10 DIAGNOSIS — E782 Mixed hyperlipidemia: Secondary | ICD-10-CM | POA: Diagnosis present

## 2021-08-10 DIAGNOSIS — I214 Non-ST elevation (NSTEMI) myocardial infarction: Secondary | ICD-10-CM

## 2021-08-10 DIAGNOSIS — I25110 Atherosclerotic heart disease of native coronary artery with unstable angina pectoris: Secondary | ICD-10-CM | POA: Diagnosis present

## 2021-08-10 DIAGNOSIS — Z809 Family history of malignant neoplasm, unspecified: Secondary | ICD-10-CM

## 2021-08-10 DIAGNOSIS — Z8249 Family history of ischemic heart disease and other diseases of the circulatory system: Secondary | ICD-10-CM

## 2021-08-10 DIAGNOSIS — R9431 Abnormal electrocardiogram [ECG] [EKG]: Secondary | ICD-10-CM | POA: Diagnosis not present

## 2021-08-10 DIAGNOSIS — Z79899 Other long term (current) drug therapy: Secondary | ICD-10-CM | POA: Diagnosis not present

## 2021-08-10 DIAGNOSIS — Z20822 Contact with and (suspected) exposure to covid-19: Secondary | ICD-10-CM | POA: Diagnosis present

## 2021-08-10 DIAGNOSIS — Z7982 Long term (current) use of aspirin: Secondary | ICD-10-CM | POA: Diagnosis not present

## 2021-08-10 DIAGNOSIS — I517 Cardiomegaly: Secondary | ICD-10-CM | POA: Diagnosis present

## 2021-08-10 DIAGNOSIS — Z818 Family history of other mental and behavioral disorders: Secondary | ICD-10-CM | POA: Diagnosis not present

## 2021-08-10 DIAGNOSIS — I2510 Atherosclerotic heart disease of native coronary artery without angina pectoris: Secondary | ICD-10-CM | POA: Diagnosis present

## 2021-08-10 LAB — CBC
HCT: 44.1 % (ref 39.0–52.0)
Hemoglobin: 15.5 g/dL (ref 13.0–17.0)
MCH: 31.5 pg (ref 26.0–34.0)
MCHC: 35.1 g/dL (ref 30.0–36.0)
MCV: 89.6 fL (ref 80.0–100.0)
Platelets: 212 10*3/uL (ref 150–400)
RBC: 4.92 MIL/uL (ref 4.22–5.81)
RDW: 13.2 % (ref 11.5–15.5)
WBC: 3.2 10*3/uL — ABNORMAL LOW (ref 4.0–10.5)
nRBC: 0 % (ref 0.0–0.2)

## 2021-08-10 LAB — BASIC METABOLIC PANEL
Anion gap: 11 (ref 5–15)
BUN: 11 mg/dL (ref 6–20)
CO2: 27 mmol/L (ref 22–32)
Calcium: 11 mg/dL — ABNORMAL HIGH (ref 8.9–10.3)
Chloride: 103 mmol/L (ref 98–111)
Creatinine, Ser: 0.86 mg/dL (ref 0.61–1.24)
GFR, Estimated: >60 mL/min (ref >60)
Glucose, Bld: 102 mg/dL — ABNORMAL HIGH (ref 70–99)
Potassium: 4.3 mmol/L (ref 3.5–5.1)
Sodium: 141 mmol/L (ref 135–145)

## 2021-08-10 LAB — TROPONIN I (HIGH SENSITIVITY)
Troponin I (High Sensitivity): 18 ng/L — ABNORMAL HIGH (ref <18)
Troponin I (High Sensitivity): 18 ng/L — ABNORMAL HIGH (ref <18)
Troponin I (High Sensitivity): 17 ng/L (ref <18)
Troponin I (High Sensitivity): 18 ng/L — ABNORMAL HIGH (ref <18)

## 2021-08-10 LAB — RESP PANEL BY RT-PCR (FLU A&B, COVID) ARPGX2
Influenza A by PCR: NEGATIVE
Influenza B by PCR: NEGATIVE
SARS Coronavirus 2 by RT PCR: NEGATIVE

## 2021-08-10 LAB — HEPARIN LEVEL (UNFRACTIONATED): Heparin Unfractionated: 0.15 IU/mL — ABNORMAL LOW (ref 0.30–0.70)

## 2021-08-10 LAB — MRSA NEXT GEN BY PCR, NASAL: MRSA by PCR Next Gen: NOT DETECTED

## 2021-08-10 LAB — CBG MONITORING, ED: Glucose-Capillary: 89 mg/dL (ref 70–99)

## 2021-08-10 MED ORDER — ASPIRIN 81 MG PO CHEW
81.0000 mg | CHEWABLE_TABLET | ORAL | Status: AC
  Administered 2021-08-11: 05:00:00 81 mg via ORAL
  Filled 2021-08-10: qty 1

## 2021-08-10 MED ORDER — SODIUM CHLORIDE 0.9% FLUSH: 3.0000 mL | INTRAVENOUS | Status: DC | PRN

## 2021-08-10 MED ORDER — SODIUM CHLORIDE 0.9% FLUSH
3.0000 mL | Freq: Two times a day (BID) | INTRAVENOUS | Status: DC
  Administered 2021-08-10: 22:00:00 3 mL via INTRAVENOUS

## 2021-08-10 MED ORDER — CARVEDILOL 3.125 MG PO TABS: 3.1250 mg | ORAL_TABLET | Freq: Two times a day (BID) | ORAL | Status: DC

## 2021-08-10 MED ORDER — SODIUM CHLORIDE 0.9% FLUSH
3.0000 mL | Freq: Two times a day (BID) | INTRAVENOUS | Status: DC
  Administered 2021-08-11 (×2): 3 mL via INTRAVENOUS

## 2021-08-10 MED ORDER — SODIUM CHLORIDE 0.9 % IV SOLN: 250.0000 mL | INTRAVENOUS | Status: DC | PRN

## 2021-08-10 MED ORDER — ASPIRIN 81 MG PO CHEW
324.0000 mg | CHEWABLE_TABLET | Freq: Once | ORAL | Status: AC
  Administered 2021-08-10: 11:00:00 162 mg via ORAL
  Filled 2021-08-10: qty 4

## 2021-08-10 MED ORDER — ACETAMINOPHEN 325 MG PO TABS
650.0000 mg | ORAL_TABLET | ORAL | Status: DC | PRN
  Administered 2021-08-11: 15:00:00 650 mg via ORAL
  Filled 2021-08-10: qty 2

## 2021-08-10 MED ORDER — HEPARIN BOLUS VIA INFUSION
4000.0000 [IU] | Freq: Once | INTRAVENOUS | Status: AC
  Administered 2021-08-10: 11:00:00 4000 [IU] via INTRAVENOUS

## 2021-08-10 MED ORDER — METOPROLOL TARTRATE 25 MG PO TABS
25.0000 mg | ORAL_TABLET | Freq: Two times a day (BID) | ORAL | Status: DC
  Administered 2021-08-10 – 2021-08-12 (×4): 25 mg via ORAL
  Filled 2021-08-10 (×4): qty 1

## 2021-08-10 MED ORDER — ZOLPIDEM TARTRATE 5 MG PO TABS: 5.0000 mg | ORAL_TABLET | Freq: Every evening | ORAL | Status: DC | PRN

## 2021-08-10 MED ORDER — HEPARIN BOLUS VIA INFUSION
2000.0000 [IU] | Freq: Once | INTRAVENOUS | Status: AC
  Administered 2021-08-10: 21:00:00 2000 [IU] via INTRAVENOUS
  Filled 2021-08-10: qty 2000

## 2021-08-10 MED ORDER — ALPRAZOLAM 0.5 MG PO TABS: 0.2500 mg | ORAL_TABLET | Freq: Two times a day (BID) | ORAL | Status: DC | PRN

## 2021-08-10 MED ORDER — ROSUVASTATIN CALCIUM 20 MG PO TABS
40.0000 mg | ORAL_TABLET | Freq: Every day | ORAL | Status: DC
  Administered 2021-08-11 – 2021-08-12 (×2): 40 mg via ORAL
  Filled 2021-08-10 (×2): qty 2

## 2021-08-10 MED ORDER — NITROGLYCERIN 0.4 MG SL SUBL
0.4000 mg | SUBLINGUAL_TABLET | SUBLINGUAL | Status: DC | PRN
  Administered 2021-08-10 (×2): 0.4 mg via SUBLINGUAL
  Filled 2021-08-10 (×2): qty 1

## 2021-08-10 MED ORDER — SODIUM CHLORIDE 0.9 % WEIGHT BASED INFUSION
3.0000 mL/kg/h | INTRAVENOUS | Status: DC
  Administered 2021-08-11: 04:00:00 3 mL/kg/h via INTRAVENOUS

## 2021-08-10 MED ORDER — SODIUM CHLORIDE 0.9 % WEIGHT BASED INFUSION: 1.0000 mL/kg/h | INTRAVENOUS | Status: DC

## 2021-08-10 MED ORDER — ASPIRIN EC 81 MG PO TBEC
81.0000 mg | DELAYED_RELEASE_TABLET | Freq: Every day | ORAL | Status: DC
  Administered 2021-08-12: 81 mg via ORAL
  Filled 2021-08-10: qty 1

## 2021-08-10 MED ORDER — HEPARIN (PORCINE) 25000 UT/250ML-% IV SOLN
1250.0000 [IU]/h | INTRAVENOUS | Status: DC
  Administered 2021-08-10: 11:00:00 1000 [IU]/h via INTRAVENOUS
  Administered 2021-08-11: 01:00:00 1250 [IU]/h via INTRAVENOUS
  Filled 2021-08-10 (×2): qty 250

## 2021-08-10 MED ORDER — NITROGLYCERIN 0.4 MG SL SUBL: 0.4000 mg | SUBLINGUAL_TABLET | SUBLINGUAL | Status: DC | PRN

## 2021-08-10 MED ORDER — ONDANSETRON HCL 4 MG/2ML IJ SOLN: 4.0000 mg | Freq: Four times a day (QID) | INTRAMUSCULAR | Status: DC | PRN

## 2021-08-10 MED ORDER — NITROGLYCERIN IN D5W 200-5 MCG/ML-% IV SOLN
0.0000 ug/min | INTRAVENOUS | Status: DC
  Administered 2021-08-10: 18:00:00 5 ug/min via INTRAVENOUS
  Administered 2021-08-11: 16:00:00 10 ug/min via INTRAVENOUS
  Filled 2021-08-10: qty 250

## 2021-08-10 NOTE — Progress Notes (Signed)
ANTICOAGULATION CONSULT NOTE  Pharmacy Consult for heparin Indication: chest pain/ACS  No Known Allergies  Patient Measurements: Height: 5\' 11"  (180.3 cm) Weight: 83.1 kg (183 lb 4.8 oz) IBW/kg (Calculated) : 75.3 Heparin Dosing Weight: 83.1kg  Vital Signs: Temp: 98.8 F (37.1 C) (01/24 1800) Temp Source: Oral (01/24 1800) BP: 131/77 (01/24 1800) Pulse Rate: 72 (01/24 1800)  Labs: Recent Labs    08/10/21 1054 08/10/21 1253 08/10/21 1711  HGB 15.5  --   --   HCT 44.1  --   --   PLT 212  --   --   HEPARINUNFRC  --   --  0.15*  CREATININE 0.86  --   --   TROPONINIHS 18* 18* 17     Estimated Creatinine Clearance: 99.7 mL/min (by C-G formula based on SCr of 0.86 mg/dL).   Medical History: Past Medical History:  Diagnosis Date   Arthritis    Hyperlipidemia     Medications:  Infusions:   heparin 1,000 Units/hr (08/10/21 1111)   nitroGLYCERIN 5 mcg/min (08/10/21 1731)    Assessment: 44 yom presented to the ED with CP. Troponin slightly elevated. To start IV heparin. Baseline CBC is WNL and he is not on anticoagulation PTA.  Initial heparin level resulted low at 0.15. No bleeding or issues with infusion per discussion with RN.  Goal of Therapy:  Heparin level 0.3-0.7 units/ml Monitor platelets by anticoagulation protocol: Yes   Plan:  Give heparin 2000 unit bolus x 1 Increase heparin infusion to 1250 units/hr Check a 6 hr heparin level Monitor daily CBC, s/sx bleeding F/u Cardiology plans   Arturo Morton, PharmD, BCPS Please check AMION for all [facility] contact numbers Clinical Pharmacist 08/10/2021 6:42 PM

## 2021-08-10 NOTE — Progress Notes (Signed)
ANTICOAGULATION CONSULT NOTE - Initial Consult  Pharmacy Consult for heparin Indication: chest pain/ACS  No Known Allergies  Patient Measurements:   Heparin Dosing Weight: 83.1kg  Vital Signs: Temp: 98.3 F (36.8 C) (01/24 1042) BP: 156/85 (01/24 1042) Pulse Rate: 78 (01/24 1042)  Labs: Recent Labs    08/10/21 1054  HGB 15.5  HCT 44.1  PLT 212  TROPONINIHS 18*    CrCl cannot be calculated (Patient's most recent lab result is older than the maximum 21 days allowed.).   Medical History: Past Medical History:  Diagnosis Date   Arthritis    Hyperlipidemia     Medications:  Infusions:   heparin 1,000 Units/hr (08/10/21 1111)    Assessment: 46 yom presented to the ED with CP. Troponin slightly elevated. To start IV heparin. Baseline CBC is WNL and he is not on anticoagulation PTA.   Goal of Therapy:  Heparin level 0.3-0.7 units/ml Monitor platelets by anticoagulation protocol: Yes   Plan:  Heparin bolus 4000 units IV x 1 Heparin gtt 1000 units/hr Check a 6 hr heparin level Daily heparin level and CBC  Etheridge Geil, Rande Lawman 08/10/2021,11:00 AM

## 2021-08-10 NOTE — ED Notes (Signed)
MD Trifan notified that pt is experiencing slight increase in chest pain. Pt denies radiation, no SOB, diaphoresis, N/V noted.

## 2021-08-10 NOTE — H&P (Signed)
Cardiology Admission History and Physical:   Patient ID: Dave Rogers MRN: [id number]; DOB: 08-17-1960   Admission date: 08/10/2021  PCP:  Lucky Cowboy, MD   [facility] Providers Cardiologist:  Thurmon Fair, MD        Chief Complaint:  NSTEMI  Patient Profile:   Dave Rogers is a 61 y.o. male with no prev cardiac hx, FH CAD, HLD, OA, who is being seen 08/10/2021 for the evaluation of NSTEMI.  History of Present Illness:   Dave Rogers is a physically active and healthy 61 year old man without known coronary or peripheral vascular disease, taking a low-dose of statin for hypercholesterolemia, presenting with complaints of chest discomfort.  Symptoms began shortly after he completed his usual workout at the gym, while in the steam room and persisted while he was driving home.  When he woke up the next morning the symptoms of chest tightness were still present and he was evaluated this morning in the [facility].  Although his cardiac enzymes are normal, his electrocardiogram shows dramatic repolarization abnormalities.  He continues to have mild chest discomfort and has been started on a nitroglycerin intravenous infusion.  Still has roughly 4-5/10 chest discomfort but appears comfortable lying in bed.  He denies problems with dizziness, lightheadedness, syncope, palpitations or shortness of breath at rest or with activity.  He has not had lower extremity edema or claudication.  He has been eating a healthy diet for years and is switched to a "keto" diet over the last month or so and has lost 10 pounds.  This has been a "dry January" and he has not had any alcoholic beverages in weeks.  He exercises at the gym most days of the week and is a self-described "health nut".  Family history of coronary disease is limited to heart attack as the cause of demise at a young age and his maternal grandfather in his 20s.  He does not know any details about this.  He has never  smoked.  He does not have diabetes mellitus or hypertension.  On rosuvastatin 20 mg every other evening his LDL cholesterol is in the 26O.  He has an excellent HDL at 59.  His only other significant medical problem was a reverse shoulder replacement a few years ago.  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   Past Medical History:  Diagnosis Date   Arthritis    Hyperlipidemia     Past Surgical History:  Procedure Laterality Date   ANAL FISSURE REPAIR  2006   HERNIA REPAIR Right 2006   TOTAL SHOULDER ARTHROPLASTY Right 08/01/2019   Procedure: TOTAL SHOULDER ARTHROPLASTY;  Surgeon: Jones Broom, MD;  Location: [facility];  Service: Orthopedics;  Laterality: Right;     Medications Prior to Admission: Prior to Admission medications   Medication Sig Start Date End Date Taking? Authorizing Provider  Ascorbic Acid (VITAMIN C PO) Take 1 tablet by mouth daily.   Yes [provider]  b complex vitamins tablet Take 1 tablet by mouth daily.   Yes [provider]  rosuvastatin (CRESTOR) 40 MG tablet Take  1 tablet  Daily  for Cholesterol 08/25/20  Yes Lucky Cowboy, MD  VITAMIN D PO Take 1,000 Units by mouth daily. Takes 4 capsules daily   Yes [provider]     Allergies:  No Known Allergies  Social History:   Social History   Socioeconomic History   Marital status: Married    Spouse name: Not on file   Number of children: 1   Years of education: Not on file   Highest education level: Not on file  Occupational History   Not on file  Tobacco Use   Smoking status: Never   Smokeless tobacco: Never  Vaping Use   Vaping Use: Never used  Substance and Sexual Activity   Alcohol use: Yes    Alcohol/week: 6.0 standard drinks    Types: 3  Glasses of wine, 3 Cans of beer per week    Comment: ocassional   Drug use: No   Sexual activity: Yes  Other Topics Concern   Not on file  Social History Narrative   Not on file   Social Determinants of Health   Financial Resource Strain: Not on file  Food Insecurity: Not on file  Transportation Needs: Not on file  Physical Activity: Not on file  Stress: Not on file  Social Connections: Not on file  Intimate Partner Violence: Not on file    Family History:   The patient's family history includes Cancer in his father; Dementia in his father and mother; Heart attack in his maternal grandmother.    ROS:  Please see the history of present illness.  All other ROS reviewed and negative.     Physical Exam/Data:   Vitals:   08/10/21 1700 08/10/21 1715 08/10/21 1800 08/10/21 1830  BP: 132/78 (!) 148/84  131/77  Pulse: 63 66  63  Resp: Temp:    98.8 F (37.1 C)  TempSrc:    Oral  SpO2: 97% 98%  95%  Weight:      Height:    (1.803 m)     Intake/Output Summary (Last 24 hours) at 08/10/2021 1926 Last data filed at 08/10/2021 1635 Gross per 24 hour  Intake --  Output 650 ml  Net -650 ml   Last 3 Weights 08/10/2021 08/18/2020 12/10/2019  Weight (lbs) 183 lb 4.8 oz 192 lb 3.2 oz 190 lb 9.6 oz  Weight (kg) 83.144 kg 87.181 kg 86.456 kg     Body mass index is 25.57 kg/m.  General:  Well nourished, well developed, in no acute distress.  Appears physically fit HEENT: normal Neck: no JVD Vascular: No carotid bruits; Distal pulses 2+ bilaterally   Cardiac:  normal S1, S2; RRR; no murmur  Lungs:  clear to auscultation bilaterally, no wheezing, rhonchi or rales  Abd: soft, nontender, no hepatomegaly  Ext: no edema Musculoskeletal:  No deformities, BUE and BLE strength normal and equal Skin: warm and dry  Neuro:  CNs 2-12 intact, no focal abnormalities noted Psych:  Normal affect    EKG:  The ECG that was done earlier this morning and repeated around 1700 hrs.  was personally reviewed and demonstrates sinus rhythm with very prominent downsloping ST segment depression and T wave inversion in multiple leads most obviously in V4-V6 and in the inferior leads.  The repolarization changes resemble the secondary changes of LVH, but are completely new when compared to electrocardiograms from a year ago.  Relevant CV Studies: CARDIAC CATH: 08/11/2021  Laboratory Data:  High Sensitivity Troponin:   Recent Labs  Lab 08/10/21 1054 08/10/21 1253 08/10/21 1711  TROPONINIHS 18* 18* 17      Chemistry Recent Labs  Lab 08/10/21 1054  NA 141  K 4.3  CL  103  CO2 27  GLUCOSE 102*  BUN 11  CREATININE 0.86  CALCIUM 11.0*  GFRNONAA >60  ANIONGAP 11    No results for input(s): PROT, ALBUMIN, AST, ALT, ALKPHOS, BILITOT in the last 168 hours. Lipids No results for input(s): CHOL, TRIG, HDL, LABVLDL, LDLCALC, CHOLHDL in the last 168 hours. Hematology Recent Labs  Lab 08/10/21 1054  WBC 3.2*  RBC 4.92  HGB 15.5  HCT 44.1  MCV 89.6  MCH 31.5  MCHC 35.1  RDW 13.2  PLT 212   Thyroid No results for input(s): TSH, FREET4 in the last 168 hours. BNPNo results for input(s): BNP, PROBNP in the last 168 hours.  DDimer No results for input(s): DDIMER in the last 168 hours.   Radiology/Studies:  DG Chest Portable 1 View  Result Date: 08/10/2021 CLINICAL DATA:  Chest pain EXAM: PORTABLE CHEST 1 VIEW COMPARISON:  Chest x-ray 07/29/2019 FINDINGS: Heart size and mediastinal contours are within normal limits. No suspicious pulmonary opacities identified. No pleural effusion or pneumothorax visualized. No acute osseous abnormality appreciated. IMPRESSION: No acute intrathoracic process identified. Electronically Signed   By: Jannifer Hick M.D.   On: 08/10/2021 11:10     Assessment and Plan:   Unstable angina: Symptoms are strongly suggestive of unstable angina and his ECG changes are concerning for proximal LAD artery stenosis or even multivessel disease,  based on the extent and severity.  There is no evidence of Q waves and his cardiac enzymes have remained normal, suggesting that has been no permanent myocardial injury.  Due to persistent angina pectoris and prominent ECG changes I recommend proceeding directly to cardiac catheterization, although this does not appear to be needed as an emergency procedure.  Continue intravenous heparin and intravenous nitroglycerin.  Start low-dose beta-blocker (will be limited by his relatively low resting heart rate).  Increase his statin dose to acute coronary syndrome dose.  The purpose of cardiac catheterization and possible percutaneous angioplasty with placement of drug-eluting stent was discussed in detail with the patient and his wife.  We reviewed potential complications, including but not limited to myocardial infarction, stroke, death, bleeding, contrast nephrotoxicity and renal insufficiency, arrhythmia.  We also discussed the potential treatment decisions based on the findings on coronary angiography which could even include recommendation for coronary artery bypass surgery. Aorta dilation: The ascending aorta was marginally dilated at 41 mm.  His current symptoms are not suggestive of aortic dissection and the ECG changes are much more likely in keeping with an acute coronary event.  We will check an echocardiogram.  Would like to avoid a double dose of contrast.  Consider follow-up CT later.   Risk Assessment/Risk Scores:    TIMI Risk Score for Unstable Angina or Non-ST Elevation MI:   The patient's TIMI risk score is 4, which indicates a 20% risk of all cause mortality, new or recurrent myocardial infarction or need for urgent revascularization in the next 14 days.     Severity of Illness: The appropriate patient status for this patient is INPATIENT. Inpatient status is judged to be reasonable and necessary in order to provide the required intensity of service to ensure the patient's safety. The  patient's presenting symptoms, physical exam findings, and initial radiographic and laboratory data in the context of their chronic comorbidities is felt to place them at high risk for further clinical deterioration. Furthermore, it is not anticipated that the patient will be medically stable for discharge from the hospital within 2 midnights of admission.   *  I certify that at the point of admission it is my clinical judgment that the patient will require inpatient hospital care spanning beyond 2 midnights from the point of admission due to high intensity of service, high risk for further deterioration and high frequency of surveillance required.*   For questions or updates, please contact [facility] Please consult [email or web address] for contact info under     Signed, Theodore Demark, PA-C  08/10/2021 7:26 PM

## 2021-08-10 NOTE — ED Notes (Signed)
Pt states chest pain unchanged after SL nitro. Pt continues to rate pain 6/10 described as heaviness

## 2021-08-10 NOTE — ED Triage Notes (Signed)
Pt c/o central, non-radiating chest pain after working out last night. Pt took one 81 mg apirin PTA

## 2021-08-10 NOTE — ED Provider Notes (Signed)
[facility] EMERGENCY DEPT Provider Note   CSN: [id number] Arrival date & time: 08/10/21  1038     History  Chief Complaint  Patient presents with   Chest Pain    Dave Rogers is a 61 y.o. male.  Patient is a 61 year old male with a history of hyperlipidemia who is presenting today with complaint of chest pain.  Patient reports that he had finished up his workout yesterday and he was coming out of the steam room when he started having a burning discomfort in his lower chest.  He felt tired with this but did not have shortness of breath, nausea, diaphoresis, near syncope.  He felt that he would wait and see how he felt today but when he woke up this morning he was still having the discomfort and also noticed a little bit of pain in his left arm.  He denies any pain in his abdomen or his neck.  He has not had cough congestion or fever.  No prior history of similar symptoms.  No known cardiac history.  Patient does not use tobacco products or drugs.  He did call his doctor because he was planning on going out of town tomorrow for work and his doctor recommended he come here.  He has not taken anything for the pain prior to arrival.  The history is provided by the patient.  Chest Pain     Home Medications Prior to Admission medications   Medication Sig Start Date End Date Taking? Authorizing Provider  Ascorbic Acid (VITAMIN C PO) Take 1 tablet by mouth daily.   Yes [provider]  b complex vitamins tablet Take 1 tablet by mouth daily.   Yes [provider]  rosuvastatin (CRESTOR) 40 MG tablet Take  1 tablet  Daily  for Cholesterol 08/25/20  Yes Unk Pinto, MD  VITAMIN D PO Take 1,000 Units by mouth daily. Takes 4 capsules daily   Yes [provider]      Allergies    Patient has no known allergies.    Review of Systems   Review of Systems  Cardiovascular:  Positive for chest pain.   Physical Exam Updated Vital Signs BP (!) 148/84     Pulse 72    Temp 98.3 F (36.8 C)    Resp 17    Ht 5\' 11"  (1.803 m)    Wt 83.1 kg    SpO2 98%    BMI 25.57 kg/m  Physical Exam Vitals and nursing note reviewed.  Constitutional:      General: He is not in acute distress.    Appearance: He is well-developed.  HENT:     Head: Normocephalic and atraumatic.  Eyes:     Conjunctiva/sclera: Conjunctivae normal.     Pupils: Pupils are equal, round, and reactive to light.  Cardiovascular:     Rate and Rhythm: Normal rate and regular rhythm.     Heart sounds: No murmur heard. Pulmonary:     Effort: Pulmonary effort is normal. No respiratory distress.     Breath sounds: Normal breath sounds. No wheezing or rales.  Abdominal:     General: There is no distension.     Palpations: Abdomen is soft.     Tenderness: There is no abdominal tenderness. There is no guarding or rebound.  Musculoskeletal:        General: No tenderness. Normal range of motion.     Cervical back: Normal range of motion and neck supple.     Right  lower leg: No edema.     Left lower leg: No edema.  Skin:    General: Skin is warm and dry.     Findings: No erythema or rash.  Neurological:     Mental Status: He is alert and oriented to person, place, and time.  Psychiatric:        Behavior: Behavior normal.    ED Results / Procedures / Treatments   Labs (all labs ordered are listed, but only abnormal results are displayed) Labs Reviewed  CBC - Abnormal; Notable for the following components:      Result Value   WBC 3.2 (*)    All other components within normal limits  TROPONIN I (HIGH SENSITIVITY) - Abnormal; Notable for the following components:   Troponin I (High Sensitivity) 18 (*)    All other components within normal limits  RESP PANEL BY RT-PCR (FLU A&B, COVID) ARPGX2  BASIC METABOLIC PANEL  HEPARIN LEVEL (UNFRACTIONATED)  CBG MONITORING, ED    EKG EKG Interpretation  Date/Time:  Tuesday August 10 2021 10:43:17 EST Ventricular Rate:  73 PR  Interval:  156 QRS Duration: 86 QT Interval:  372 QTC Calculation: 409 R Axis:   74 Text Interpretation: Normal sinus rhythm Minimal voltage criteria for LVH, may be normal variant ( Sokolow-Lyon ) Septal infarct , age undetermined New Marked ST abnormality, possible inferior subendocardial injury When compared with ECG of 29-Jul-2019 09:20, Significant changes have occurred Confirmed by Blanchie Dessert [phone]) on 08/10/2021 10:50:36 AM  Radiology DG Chest Portable 1 View  Result Date: 08/10/2021 CLINICAL DATA:  Chest pain EXAM: PORTABLE CHEST 1 VIEW COMPARISON:  Chest x-ray 07/29/2019 FINDINGS: Heart size and mediastinal contours are within normal limits. No suspicious pulmonary opacities identified. No pleural effusion or pneumothorax visualized. No acute osseous abnormality appreciated. IMPRESSION: No acute intrathoracic process identified. Electronically Signed   By: Ofilia Neas M.D.   On: 08/10/2021 11:10    Procedures Procedures    Medications Ordered in ED Medications  nitroGLYCERIN (NITROSTAT) SL tablet 0.4 mg (0.4 mg Sublingual Given 08/10/21 1106)  heparin ADULT infusion 100 units/mL (25000 units/218mL) (1,000 Units/hr Intravenous New Bag/Given 08/10/21 1111)  aspirin chewable tablet 324 mg (162 mg Oral Given 08/10/21 1104)  heparin bolus via infusion 4,000 Units (4,000 Units Intravenous Bolus from Bag 08/10/21 1111)    ED Course/ Medical Decision Making/ A&P                           Medical Decision Making Amount and/or Complexity of Data Reviewed Labs: ordered. Radiology: ordered.  Risk OTC drugs. Prescription drug management.   Patient is a 62 year old male presenting today with a good story for ACS.  His EKG initially done when he arrived in triage she is significantly abnormal with deep ST wave different pression in the inferior lateral leads with concern for ischemia.  Patient does not have history to suggest infectious etiology, myocarditis, pericarditis,  dissection, PE.  Discussed with Dr. Claiborne Billings with cardiology as patient's has a significantly abnormal EKG and ongoing pain however he does not meet STEMI criteria.  Feel that patient will need admission to the cardiology service.  External medical records were evaluated.  Patient was started on aspirin, heparin and was given nitroglycerin with some improvement of his pain.  Initial troponin which I independently evaluated and interpreted is mildly elevated at 18, CBC with minimal leukopenia of 3.2, BMP with normal renal function and COVID is negative.  I  independently evaluate patient's chest x-ray and is within normal limits.  Will discuss with cardiology.  Given the high morbidity mortality of NSTEMI feel that patient meets criteria for admission.  Findings discussed with the patient and his questions were answered.  Patient was continued on a heparin drip.  He remained on a cardiac monitor without evidence of dysrhythmia.  CRITICAL CARE Performed by: Rhyatt Muska Total critical care time: 40 minutes Critical care time was exclusive of separately billable procedures and treating other patients. Critical care was necessary to treat or prevent imminent or life-threatening deterioration. Critical care was time spent personally by me on the following activities: development of treatment plan with patient and/or surrogate as well as nursing, discussions with consultants, evaluation of patient's response to treatment, examination of patient, obtaining history from patient or surrogate, ordering and performing treatments and interventions, ordering and review of laboratory studies, ordering and review of radiographic studies, pulse oximetry and re-evaluation of patient's condition.         Final Clinical Impression(s) / ED Diagnoses Final diagnoses:  NSTEMI (non-ST elevated myocardial infarction) [facility])    Rx / DC Orders ED Discharge Orders     None         Blanchie Dessert, MD 08/10/21  1156

## 2021-08-11 ENCOUNTER — Inpatient Hospital Stay (HOSPITAL_COMMUNITY): Payer: PRIVATE HEALTH INSURANCE

## 2021-08-11 ENCOUNTER — Encounter (HOSPITAL_COMMUNITY): Payer: Self-pay | Admitting: Cardiovascular Disease

## 2021-08-11 ENCOUNTER — Encounter (HOSPITAL_COMMUNITY)
Admission: EM | Disposition: A | Discharge status: Home / Self Care | Payer: Self-pay | Source: Home / Self Care | Attending: Cardiovascular Disease

## 2021-08-11 DIAGNOSIS — R9431 Abnormal electrocardiogram [ECG] [EKG]: Secondary | ICD-10-CM

## 2021-08-11 DIAGNOSIS — I214 Non-ST elevation (NSTEMI) myocardial infarction: Secondary | ICD-10-CM

## 2021-08-11 DIAGNOSIS — I200 Unstable angina: Secondary | ICD-10-CM

## 2021-08-11 HISTORY — PX: LEFT HEART CATH AND CORONARY ANGIOGRAPHY: CATH118249

## 2021-08-11 LAB — HIV ANTIBODY (ROUTINE TESTING W REFLEX): HIV Screen 4th Generation wRfx: NONREACTIVE

## 2021-08-11 LAB — LIPID PANEL
Cholesterol: 123 mg/dL (ref 0–200)
HDL: 45 mg/dL (ref >40)
LDL Cholesterol: 58 mg/dL (ref 0–99)
Total CHOL/HDL Ratio: 2.7 RATIO
Triglycerides: 98 mg/dL (ref <150)
VLDL: 20 mg/dL (ref 0–40)

## 2021-08-11 LAB — COMPREHENSIVE METABOLIC PANEL
ALT: 19 U/L (ref 0–44)
AST: 19 U/L (ref 15–41)
Albumin: 3.6 g/dL (ref 3.5–5.0)
Alkaline Phosphatase: 49 U/L (ref 38–126)
Anion gap: 7 (ref 5–15)
BUN: 9 mg/dL (ref 6–20)
CO2: 24 mmol/L (ref 22–32)
Calcium: 9.9 mg/dL (ref 8.9–10.3)
Chloride: 104 mmol/L (ref 98–111)
Creatinine, Ser: 0.88 mg/dL (ref 0.61–1.24)
GFR, Estimated: >60 mL/min (ref >60)
Glucose, Bld: 98 mg/dL (ref 70–99)
Potassium: 3.8 mmol/L (ref 3.5–5.1)
Sodium: 135 mmol/L (ref 135–145)
Total Bilirubin: 0.8 mg/dL (ref 0.3–1.2)
Total Protein: 5.7 g/dL — ABNORMAL LOW (ref 6.5–8.1)

## 2021-08-11 LAB — CBC
HCT: 39.1 % (ref 39.0–52.0)
Hemoglobin: 13.9 g/dL (ref 13.0–17.0)
MCH: 32 pg (ref 26.0–34.0)
MCHC: 35.5 g/dL (ref 30.0–36.0)
MCV: 89.9 fL (ref 80.0–100.0)
Platelets: 173 10*3/uL (ref 150–400)
RBC: 4.35 MIL/uL (ref 4.22–5.81)
RDW: 13.1 % (ref 11.5–15.5)
WBC: 4 10*3/uL (ref 4.0–10.5)
nRBC: 0 % (ref 0.0–0.2)

## 2021-08-11 LAB — ECHOCARDIOGRAM COMPLETE
AR max vel: 2.3 cm2
AV Area VTI: 2.09 cm2
AV Area mean vel: 2.17 cm2
AV Mean grad: 4 mmHg
AV Peak grad: 7.3 mmHg
Ao pk vel: 1.35 m/s
Area-P 1/2: 4.24 cm2
Height: 71 in
S' Lateral: 2.8 cm
Weight: 2899.49 oz

## 2021-08-11 LAB — HEMOGLOBIN A1C
Hgb A1c MFr Bld: 5 % (ref 4.8–5.6)
Mean Plasma Glucose: 97 mg/dL

## 2021-08-11 LAB — HEPARIN LEVEL (UNFRACTIONATED): Heparin Unfractionated: 0.32 IU/mL (ref 0.30–0.70)

## 2021-08-11 LAB — TSH: TSH: 3.384 u[IU]/mL (ref 0.350–4.500)

## 2021-08-11 SURGERY — LEFT HEART CATH AND CORONARY ANGIOGRAPHY
Anesthesia: LOCAL

## 2021-08-11 MED ORDER — HEPARIN (PORCINE) IN NACL 1000-0.9 UT/500ML-% IV SOLN
INTRAVENOUS | Status: AC
  Filled 2021-08-11: qty 1000

## 2021-08-11 MED ORDER — MIDAZOLAM HCL 2 MG/2ML IJ SOLN
INTRAMUSCULAR | Status: AC
  Filled 2021-08-11: qty 2

## 2021-08-11 MED ORDER — HEPARIN SODIUM (PORCINE) 1000 UNIT/ML IJ SOLN
INTRAMUSCULAR | Status: DC | PRN
  Administered 2021-08-11: 4200 [IU] via INTRAVENOUS

## 2021-08-11 MED ORDER — ASPIRIN 81 MG PO CHEW: 81.0000 mg | CHEWABLE_TABLET | Freq: Every day | ORAL | Status: DC

## 2021-08-11 MED ORDER — LABETALOL HCL 5 MG/ML IV SOLN: 10.0000 mg | INTRAVENOUS | Status: AC | PRN

## 2021-08-11 MED ORDER — LIDOCAINE HCL (PF) 1 % IJ SOLN
INTRAMUSCULAR | Status: AC
  Filled 2021-08-11: qty 30

## 2021-08-11 MED ORDER — ONDANSETRON HCL 4 MG/2ML IJ SOLN: 4.0000 mg | Freq: Four times a day (QID) | INTRAMUSCULAR | Status: DC | PRN

## 2021-08-11 MED ORDER — HYDRALAZINE HCL 20 MG/ML IJ SOLN: 10.0000 mg | INTRAMUSCULAR | Status: AC | PRN

## 2021-08-11 MED ORDER — SODIUM CHLORIDE 0.9 % IV SOLN: INTRAVENOUS | Status: DC

## 2021-08-11 MED ORDER — MIDAZOLAM HCL 2 MG/2ML IJ SOLN
INTRAMUSCULAR | Status: DC | PRN
  Administered 2021-08-11: 2 mg via INTRAVENOUS

## 2021-08-11 MED ORDER — SODIUM CHLORIDE 0.9% FLUSH
3.0000 mL | Freq: Two times a day (BID) | INTRAVENOUS | Status: DC
  Administered 2021-08-11: 22:00:00 3 mL via INTRAVENOUS

## 2021-08-11 MED ORDER — IOHEXOL 350 MG/ML SOLN
INTRAVENOUS | Status: DC | PRN
  Administered 2021-08-11: 18:00:00 65 mL

## 2021-08-11 MED ORDER — VERAPAMIL HCL 2.5 MG/ML IV SOLN
INTRAVENOUS | Status: DC | PRN
  Administered 2021-08-11: 17:00:00 10 mL via INTRA_ARTERIAL

## 2021-08-11 MED ORDER — HEPARIN (PORCINE) IN NACL 1000-0.9 UT/500ML-% IV SOLN
INTRAVENOUS | Status: DC | PRN
  Administered 2021-08-11 (×2): 500 mL

## 2021-08-11 MED ORDER — FENTANYL CITRATE (PF) 100 MCG/2ML IJ SOLN
INTRAMUSCULAR | Status: AC
  Filled 2021-08-11: qty 2

## 2021-08-11 MED ORDER — ACETAMINOPHEN 325 MG PO TABS: 650.0000 mg | ORAL_TABLET | ORAL | Status: DC | PRN

## 2021-08-11 MED ORDER — SODIUM CHLORIDE 0.9% FLUSH: 3.0000 mL | INTRAVENOUS | Status: DC | PRN

## 2021-08-11 MED ORDER — SODIUM CHLORIDE 0.9 % IV SOLN: 250.0000 mL | INTRAVENOUS | Status: DC | PRN

## 2021-08-11 MED ORDER — HEPARIN SODIUM (PORCINE) 1000 UNIT/ML IJ SOLN
INTRAMUSCULAR | Status: AC
  Filled 2021-08-11: qty 10

## 2021-08-11 MED ORDER — FENTANYL CITRATE (PF) 100 MCG/2ML IJ SOLN
INTRAMUSCULAR | Status: DC | PRN
  Administered 2021-08-11: 25 ug via INTRAVENOUS

## 2021-08-11 MED ORDER — VERAPAMIL HCL 2.5 MG/ML IV SOLN
INTRAVENOUS | Status: AC
  Filled 2021-08-11: qty 2

## 2021-08-11 MED ORDER — LIDOCAINE HCL (PF) 1 % IJ SOLN
INTRAMUSCULAR | Status: DC | PRN
  Administered 2021-08-11: 2 mL

## 2021-08-11 SURGICAL SUPPLY — 11 items

## 2021-08-11 NOTE — Progress Notes (Signed)
Progress Note  Patient Name: Dave Rogers Date of Encounter: 08/11/2021  [facility] Cardiologist: Thurmon Fair, MD   Subjective   Dave Rogers is still in discomfort. Has persistent mild chest discomfort.   Inpatient Medications    Scheduled Meds:  [START ON 08/12/2021] aspirin EC  81 mg Oral Daily   metoprolol tartrate  25 mg Oral BID   rosuvastatin  40 mg Oral Daily   sodium chloride flush  3 mL Intravenous Q12H   sodium chloride flush  3 mL Intravenous Q12H   Continuous Infusions:  sodium chloride     sodium chloride     sodium chloride 1 mL/kg/hr (08/11/21 0513)   heparin 1,250 Units/hr (08/11/21 0102)   nitroGLYCERIN 5 mcg/min (08/10/21 1731)   PRN Meds: sodium chloride, sodium chloride, acetaminophen, ALPRAZolam, nitroGLYCERIN, ondansetron (ZOFRAN) IV, sodium chloride flush, sodium chloride flush, zolpidem   Vital Signs    Vitals:   08/11/21 0300 08/11/21 0442 08/11/21 0758 08/11/21 0800  BP: 103/64  111/61 116/71  Pulse: 61  62 62  Resp: 17  15 15   Temp: 98.1 F (36.7 C)  98.6 F (37 C)   TempSrc: Oral  Oral   SpO2: 95%  98% 97%  Weight:  82.2 kg    Height:        Intake/Output Summary (Last 24 hours) at 08/11/2021 0859 Last data filed at 08/11/2021 0415 Gross per 24 hour  Intake 600 ml  Output 1950 ml  Net -1350 ml   Last 3 Weights 08/11/2021 08/10/2021 08/18/2020  Weight (lbs) 181 lb 3.5 oz 183 lb 4.8 oz 192 lb 3.2 oz  Weight (kg) 82.2 kg 83.144 kg 87.181 kg      Telemetry    Sinus rhythm, ST depression lead II - Personally Reviewed  ECG    Sinus rhythm, inferolateral ST depression, deep TWI laterally - Personally Reviewed  Physical Exam   Vitals:   08/11/21 0758 08/11/21 0800  BP: 111/61 116/71  Pulse: 62 62  Resp: 15 15  Temp: 98.6 F (37 C)   SpO2: 98% 97%    GEN: No acute distress.   Neck: No sig JVD Cardiac: RRR, no murmurs, rubs, or gallops.  Respiratory: Nl wob, Clear to auscultation bilaterally. GI:on-distended   MS: No edema; No deformity. Neuro:  Nonfocal  Skin: warm and well perfused Psych: Normal affect   Labs    High Sensitivity Troponin:   Recent Labs  Lab 08/10/21 1054 08/10/21 1253 08/10/21 1711 08/10/21 1946  TROPONINIHS 18* 18* 17 18*     Chemistry Recent Labs  Lab 08/10/21 1054 08/11/21 0149  NA 141 135  K 4.3 3.8  CL 103 104  CO2 27 24  GLUCOSE 102* 98  BUN 11 9  CREATININE 0.86 0.88  CALCIUM 11.0* 9.9  PROT  --  5.7*  ALBUMIN  --  3.6  AST  --  19  ALT  --  19  ALKPHOS  --  49  BILITOT  --  0.8  GFRNONAA >60 >60  ANIONGAP 11 7    Lipids  Recent Labs  Lab 08/11/21 0149  CHOL 123  TRIG 98  HDL 45  LDLCALC 58  CHOLHDL 2.7    Hematology Recent Labs  Lab 08/10/21 1054 08/11/21 0149  WBC 3.2* 4.0  RBC 4.92 4.35  HGB 15.5 13.9  HCT 44.1 39.1  MCV 89.6 89.9  MCH 31.5 32.0  MCHC 35.1 35.5  RDW 13.2 13.1  PLT 212 173   Thyroid  Recent Labs  Lab 08/11/21 0149  TSH 3.384    BNPNo results for input(s): BNP, PROBNP in the last 168 hours.  DDimer No results for input(s): DDIMER in the last 168 hours.   Radiology    DG Chest Portable 1 View  Result Date: 08/10/2021 CLINICAL DATA:  Chest pain EXAM: PORTABLE CHEST 1 VIEW COMPARISON:  Chest x-ray 07/29/2019 FINDINGS: Heart size and mediastinal contours are within normal limits. No suspicious pulmonary opacities identified. No pleural effusion or pneumothorax visualized. No acute osseous abnormality appreciated. IMPRESSION: No acute intrathoracic process identified. Electronically Signed   By: Jannifer Hick M.D.   On: 08/10/2021 11:10    Cardiac Studies   Pending echo, LHC  Patient Profile     61 y.o. male hx of HLD, MGF with early onset CAD, presented with typical chest pain, EKG inferior ST depression, deep TWI in the lateral leads possible Lcx lesion  Assessment & Plan    Unstable angina: troponin very mild however presentation classic for angina. He has family hx and HLD. His EKG is  concerning for ischemia. Otherwise he's in sinus rhythm. No signs of CHF. Good radial pulse. Normal renal fxn. Normal hgb -LHC today -TTE at the bedside showed normal LV function - continue asa 81 mg daily - continue heparin gtt - continue metop 25 mg tartrate BID - continue crestor 40 mg daily  The patient understands that risks included but are not limited to stroke (1 in 1000), death (1 in 1000), kidney failure [usually temporary] (1 in 500), bleeding (1 in 200), allergic reaction [possibly serious] (1 in 200).     For questions or updates, please contact [facility] Please consult [email or web address] for contact info under        Signed, Maisie Fus, MD  08/11/2021, 8:59 AM

## 2021-08-11 NOTE — Plan of Care (Signed)
°  Problem: Activity: Goal: Ability to return to baseline activity level will improve Outcome: Progressing   Problem: Activity: Goal: Risk for activity intolerance will decrease Outcome: Progressing   Problem: Pain Managment: Goal: General experience of comfort will improve Outcome: Progressing   Problem: Safety: Goal: Ability to remain free from injury will improve Outcome: Progressing

## 2021-08-11 NOTE — TOC Progression Note (Signed)
Transition of Care [facility]) - Progression Note    Patient Details  Name: Dave Rogers MRN: [id number] Date of Birth: [birth year]  Transition of Care [facility]) CM/SW Contact  Beckie Busing, RN Phone [phone]  08/11/2021, 9:11 AM  Clinical Narrative:     Transition of Care [facility]) Screening Note   Patient Details  Name: Dave Rogers Date of Birth: December 28, 1960   Transition of Care [facility]) CM/SW Contact:    Beckie Busing, RN Phone Number: 08/11/2021, 9:11 AM    Transition of Care Department [facility]) has reviewed patient and no TOC needs have been identified at this time. We will continue to monitor patient advancement through interdisciplinary progression rounds. If new patient transition needs arise, please place a TOC consult.          Expected Discharge Plan and Services                                                 Social Determinants of Health (SDOH) Interventions    Readmission Risk Interventions No flowsheet data found.

## 2021-08-11 NOTE — Interval H&P Note (Signed)
Cath Lab Visit (complete for each Cath Lab visit)  Clinical Evaluation Leading to the Procedure:   ACS: No.  Non-ACS:    Anginal Classification: CCS III  Anti-ischemic medical therapy: Minimal Therapy (1 class of medications)  Non-Invasive Test Results: No non-invasive testing performed  Prior CABG: No previous CABG      History and Physical Interval Note:  08/11/2021 5:15 PM  Dave Rogers  has presented today for surgery, with the diagnosis of chest pain.  The various methods of treatment have been discussed with the patient and family. After consideration of risks, benefits and other options for treatment, the patient has consented to  Procedure(s): LEFT HEART CATH AND CORONARY ANGIOGRAPHY (N/A) as a surgical intervention.  The patient's history has been reviewed, patient examined, no change in status, stable for surgery.  I have reviewed the patient's chart and labs.  Questions were answered to the patient's satisfaction.     Nicki Guadalajara

## 2021-08-11 NOTE — Plan of Care (Signed)
°  Problem: Pain Managment: Goal: General experience of comfort will improve 08/11/2021 0137 by Arnette Felts, RN Outcome: Progressing 08/11/2021 0135 by Arnette Felts, RN Outcome: Progressing

## 2021-08-11 NOTE — Plan of Care (Signed)
°  Problem: Education: Goal: Understanding of CV disease, CV risk reduction, and recovery process will improve Outcome: Progressing Goal: Individualized Educational Video(s) Outcome: Progressing   Problem: Activity: Goal: Ability to return to baseline activity level will improve Outcome: Progressing   Problem: Cardiovascular: Goal: Ability to achieve and maintain adequate cardiovascular perfusion will improve Outcome: Progressing Goal: Vascular access site(s) Level 0-1 will be maintained Outcome: Progressing   Problem: Health Behavior/Discharge Planning: Goal: Ability to safely manage health-related needs after discharge will improve Outcome: Progressing   Problem: Health Behavior/Discharge Planning: Goal: Ability to manage health-related needs will improve Outcome: Progressing   Problem: Clinical Measurements: Goal: Ability to maintain clinical measurements within normal limits will improve Outcome: Progressing Goal: Will remain free from infection Outcome: Progressing Goal: Diagnostic test results will improve Outcome: Progressing Goal: Respiratory complications will improve Outcome: Progressing Goal: Cardiovascular complication will be avoided Outcome: Progressing   Problem: Activity: Goal: Risk for activity intolerance will decrease Outcome: Progressing   Problem: Pain Managment: Goal: General experience of comfort will improve Outcome: Progressing   Problem: Safety: Goal: Ability to remain free from injury will improve Outcome: Progressing

## 2021-08-11 NOTE — Plan of Care (Signed)
°  Problem: Activity: Goal: Ability to return to baseline activity level will improve Outcome: Progressing   Problem: Clinical Measurements: Goal: Respiratory complications will improve Outcome: Progressing   Problem: Safety: Goal: Ability to remain free from injury will improve Outcome: Progressing

## 2021-08-11 NOTE — Progress Notes (Signed)
ANTICOAGULATION CONSULT NOTE  Pharmacy Consult for heparin Indication: chest pain/ACS  No Known Allergies  Patient Measurements: Height: 5\' 11"  (180.3 cm) Weight: 83.1 kg (183 lb 4.8 oz) IBW/kg (Calculated) : 75.3 Heparin Dosing Weight: 83.1kg  Vital Signs: Temp: 98.5 F (36.9 C) (01/24 2305) Temp Source: Oral (01/24 2305) BP: 113/63 (01/24 2305) Pulse Rate: 58 (01/24 2305)  Labs: Recent Labs    08/10/21 1054 08/10/21 1253 08/10/21 1711 08/10/21 1946 08/11/21 0149  HGB 15.5  --   --   --  13.9  HCT 44.1  --   --   --  39.1  PLT 212  --   --   --  173  HEPARINUNFRC  --   --  0.15*  --  0.32  CREATININE 0.86  --   --   --  0.88  TROPONINIHS 18* 18* 17 18*  --      Estimated Creatinine Clearance: 97.5 mL/min (by C-G formula based on SCr of 0.88 mg/dL).   Medical History: Past Medical History:  Diagnosis Date   Arthritis    Hyperlipidemia     Medications:  Infusions:   sodium chloride     sodium chloride     sodium chloride     Followed by   sodium chloride     heparin 1,250 Units/hr (08/11/21 0102)   nitroGLYCERIN 5 mcg/min (08/10/21 1731)    Assessment: 60 yom presented to the ED with CP. Troponin slightly elevated. To start IV heparin. Baseline CBC is WNL and he is not on anticoagulation PTA.  Repeat HL is now therapeutic on 1250 units/hr. No bleeding or issues with infusion per discussion with RN.  Goal of Therapy:  Heparin level 0.3-0.7 units/ml Monitor platelets by anticoagulation protocol: Yes   Plan:  Continue heparin infusion at 1250 units/hr Check a 6 hr heparin level Monitor daily CBC, s/sx bleeding F/u after cath    08/12/21, PharmD., BCCCP Clinical Pharmacist Please refer to [facility] for unit-specific pharmacist

## 2021-08-12 ENCOUNTER — Other Ambulatory Visit: Payer: Self-pay | Admitting: Cardiology

## 2021-08-12 ENCOUNTER — Encounter (HOSPITAL_COMMUNITY): Payer: Self-pay | Admitting: Cardiovascular Disease

## 2021-08-12 DIAGNOSIS — I200 Unstable angina: Secondary | ICD-10-CM

## 2021-08-12 DIAGNOSIS — I517 Cardiomegaly: Secondary | ICD-10-CM

## 2021-08-12 DIAGNOSIS — I428 Other cardiomyopathies: Secondary | ICD-10-CM

## 2021-08-12 DIAGNOSIS — R9431 Abnormal electrocardiogram [ECG] [EKG]: Secondary | ICD-10-CM

## 2021-08-12 HISTORY — DX: Cardiomegaly: I51.7

## 2021-08-12 HISTORY — DX: Unstable angina: I20.0

## 2021-08-12 LAB — BASIC METABOLIC PANEL
Anion gap: 10 (ref 5–15)
BUN: 9 mg/dL (ref 6–20)
CO2: 23 mmol/L (ref 22–32)
Calcium: 9.8 mg/dL (ref 8.9–10.3)
Chloride: 107 mmol/L (ref 98–111)
Creatinine, Ser: 0.86 mg/dL (ref 0.61–1.24)
GFR, Estimated: >60 mL/min (ref >60)
Glucose, Bld: 86 mg/dL (ref 70–99)
Potassium: 3.8 mmol/L (ref 3.5–5.1)
Sodium: 140 mmol/L (ref 135–145)

## 2021-08-12 LAB — CBC
HCT: 37.8 % — ABNORMAL LOW (ref 39.0–52.0)
Hemoglobin: 13.5 g/dL (ref 13.0–17.0)
MCH: 32.5 pg (ref 26.0–34.0)
MCHC: 35.7 g/dL (ref 30.0–36.0)
MCV: 90.9 fL (ref 80.0–100.0)
Platelets: 164 10*3/uL (ref 150–400)
RBC: 4.16 MIL/uL — ABNORMAL LOW (ref 4.22–5.81)
RDW: 13.2 % (ref 11.5–15.5)
WBC: 3.9 10*3/uL — ABNORMAL LOW (ref 4.0–10.5)
nRBC: 0 % (ref 0.0–0.2)

## 2021-08-12 MED ORDER — METOPROLOL TARTRATE 25 MG PO TABS: 25.0000 mg | ORAL_TABLET | Freq: Two times a day (BID) | ORAL | 6 refills | Status: DC

## 2021-08-12 MED ORDER — ROSUVASTATIN CALCIUM 40 MG PO TABS: 40.0000 mg | ORAL_TABLET | Freq: Every day | ORAL | 6 refills | Status: DC

## 2021-08-12 MED ORDER — NITROGLYCERIN 0.4 MG SL SUBL: 0.4000 mg | SUBLINGUAL_TABLET | SUBLINGUAL | 4 refills | Status: DC | PRN

## 2021-08-12 MED ORDER — ASPIRIN 81 MG PO TBEC: 81.0000 mg | DELAYED_RELEASE_TABLET | Freq: Every day | ORAL | 11 refills | Status: DC

## 2021-08-12 NOTE — Plan of Care (Signed)
°  Problem: Education: Goal: Understanding of CV disease, CV risk reduction, and recovery process will improve Outcome: Adequate for Discharge Goal: Individualized Educational Video(s) Outcome: Adequate for Discharge   Problem: Activity: Goal: Ability to return to baseline activity level will improve Outcome: Adequate for Discharge   Problem: Cardiovascular: Goal: Ability to achieve and maintain adequate cardiovascular perfusion will improve Outcome: Adequate for Discharge Goal: Vascular access site(s) Level 0-1 will be maintained Outcome: Adequate for Discharge   Problem: Health Behavior/Discharge Planning: Goal: Ability to safely manage health-related needs after discharge will improve Outcome: Adequate for Discharge   Problem: Health Behavior/Discharge Planning: Goal: Ability to manage health-related needs will improve Outcome: Adequate for Discharge   Problem: Clinical Measurements: Goal: Ability to maintain clinical measurements within normal limits will improve Outcome: Adequate for Discharge Goal: Will remain free from infection Outcome: Adequate for Discharge Goal: Diagnostic test results will improve Outcome: Adequate for Discharge Goal: Respiratory complications will improve Outcome: Adequate for Discharge Goal: Cardiovascular complication will be avoided Outcome: Adequate for Discharge   Problem: Activity: Goal: Risk for activity intolerance will decrease Outcome: Adequate for Discharge   Problem: Pain Managment: Goal: General experience of comfort will improve Outcome: Adequate for Discharge   Problem: Safety: Goal: Ability to remain free from injury will improve Outcome: Adequate for Discharge

## 2021-08-12 NOTE — Progress Notes (Signed)
Patient discharge instructions reviewed and patient verbalized understanding of follow up and instructions for home. Limitations discussed. Written copy provided to patient. Patient ambulatory with RN to spouses waiting car in stable condition.

## 2021-08-12 NOTE — Discharge Summary (Signed)
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   Prox LAD lesion is 20% stenosed.   Prox RCA lesion is 15% stenosed.   There is hyperdynamic left ventricular systolic function.   The left ventricular ejection fraction is greater than 65% by visual estimate.   Mild coronary calcification with mild nonobstructive CAD.   Hyperdynamic LV function with at least moderate left ventricular hypertrophy.  EF estimate greater than 65%.  LVEDP 14 mmHg.   RECOMMENDATION: Catheterization demonstrates mild coronary calcification without  significant coronary obstructive disease.  Consider potential for microvascular angina in the etiology of his chest discomfort.  ECG changes may be contributed by left ventricular hypertrophy/repolarization.  Consider possible low-dose beta-blocker or ARB therapy for improvement in endothelial function versus amlodipine.   _____________  Echo 08/11/21  1. Left ventricular ejection fraction, by estimation, is 65 to 70%. The  left ventricle has hyperdynamic function. The left ventricle has no  regional wall motion abnormalities. There is moderate left ventricular  hypertrophy. Left ventricular diastolic  parameters are consistent with Grade II diastolic dysfunction  (pseudonormalization).   2. Right ventricular systolic function is normal. The right ventricular  size is normal.  Tricuspid regurgitation signal is inadequate for assessing  PA pressure.   3. The mitral valve is normal in structure. Trivial mitral valve  regurgitation. No evidence of mitral stenosis.   4. The aortic valve is tricuspid. Aortic valve regurgitation is not  visualized. No aortic stenosis is present.   5. The inferior vena cava is normal in size with <50% respiratory  variability, suggesting right atrial pressure of 8 mmHg.   FINDINGS   Left Ventricle: Left ventricular ejection fraction, by estimation, is 65  to 70%. The left ventricle has hyperdynamic function. The left ventricle  has no regional wall motion abnormalities. The left ventricular internal  cavity size was normal in size.  There is moderate left ventricular hypertrophy. Left ventricular diastolic  parameters are consistent with Grade II diastolic dysfunction  (pseudonormalization).   Right Ventricle: The right ventricular size is normal. No increase in  right ventricular wall thickness. Right ventricular systolic function is  normal. Tricuspid regurgitation signal is inadequate for assessing PA  pressure.   Left Atrium: Left atrial size was normal in size.   Right Atrium: Right atrial size was normal in size.   Pericardium: There is no evidence of pericardial effusion.  History of Present Illness     Dave Rogers is a 61 y.o. male with hx of HLD, MGF with early onset CAD, presented with typical chest pain, EKG inferior ST depression, deep TWI in the lateral leads possible Lcx lesion.  Hs troponin pk of 18.   His chest pain began after completing his usual workout at gym.  Discomfort continued the next day.  [facility] and EKG was significantly abnormal.   Maternal grandfather with death in his 43s,  maybe heart attack. He is on statin for hyperlipidemia.   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   Hospital Course     Consultants: none   Pt was admitted and placed on low dose BB, though limited by low HR.  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  He was placed on IV heparin and IV NTG.  And plans for cardiac cath.  He rec'd ASA as well.    He did continue with chest discomfort overnight though mild.  Cardiac cath was accomplished without complications.  He has non obstructive CAD but + LVH. Concern for increase of LVH over 1 year.   Dr. Royann Shivers would like pt to have cardiac MRI with concern for hypertrophic CM.    Today he is stable and waking without pain or complaint.  He has been seen and evaluated by Dr. Tereso Newcomer and found stable for discharge.    Did the patient have an acute coronary syndrome (MI, NSTEMI, STEMI, etc) this admission?:  No                               Did the patient have a percutaneous coronary intervention (stent / angioplasty)?:  No.       The patient will be scheduled for a TOC follow up appointment in 15 days.  A message has been sent to the [facility] and Scheduling Pool at the office where the patient should be seen for follow up.  _____________  Discharge Vitals Blood pressure 119/77, pulse 67, temperature 98.7 F (37.1 C), temperature source Oral, resp. rate 20, height 5\' 11"  (1.803 m), weight 83.1 kg, SpO2 95 %.  Filed Weights   08/10/21 1100 08/11/21 0442 08/12/21 0435  Weight: 83.1 kg 82.2 kg 83.1 kg    Labs & Radiologic Studies    CBC Recent Labs    08/11/21 0149 08/12/21 0057  WBC 4.0 3.9*  HGB 13.9 13.5  HCT 39.1 37.8*  MCV 89.9 90.9  PLT 173 164   Basic Metabolic Panel Recent Labs    48/01/65 0149 08/12/21 0057  NA 135 140  K 3.8 3.8  CL 104 107  CO2 24 23  GLUCOSE 98 86  BUN 9 9  CREATININE 0.88 0.86  CALCIUM 9.9 9.8   Liver Function Tests Recent Labs    08/11/21 0149   AST 19  ALT 19  ALKPHOS 49  BILITOT 0.8  PROT 5.7*  ALBUMIN 3.6   No results for input(s): LIPASE, AMYLASE in the last 72 hours. High Sensitivity Troponin:   Recent Labs  Lab 08/10/21 1054 08/10/21 1253 08/10/21 1711 08/10/21 1946  TROPONINIHS 18* 18* 17 18*    BNP Invalid input(s): POCBNP D-Dimer No results for input(s): DDIMER in the last 72 hours. Hemoglobin A1C Recent Labs    08/11/21 0149  HGBA1C 5.0   Fasting Lipid Panel Recent Labs    08/11/21 0149  CHOL 123  HDL 45  LDLCALC 58  TRIG 98  CHOLHDL 2.7   Thyroid Function Tests Recent Labs    08/11/21 0149  TSH 3.384   _____________  CARDIAC CATHETERIZATION  Result Date: 08/11/2021   Ramus lesion is 20% stenosed.   Prox LAD lesion is 20% stenosed.  Prox RCA lesion is 15% stenosed.   There is hyperdynamic left ventricular systolic function.   The left ventricular ejection fraction is greater than 65% by visual estimate. Mild coronary calcification with mild nonobstructive CAD. Hyperdynamic LV function with at least moderate left ventricular hypertrophy.  EF estimate greater than 65%.  LVEDP 14 mmHg. RECOMMENDATION: Catheterization demonstrates mild coronary calcification without  significant coronary obstructive disease.  Consider potential for microvascular angina in the etiology of his chest discomfort.  ECG changes may be contributed by left ventricular hypertrophy/repolarization.  Consider possible low-dose beta-blocker or ARB therapy for improvement in endothelial function versus amlodipine.  DG Chest Portable 1 View  Result Date: 08/10/2021 CLINICAL DATA:  Chest pain EXAM: PORTABLE CHEST 1 VIEW COMPARISON:  Chest x-ray 07/29/2019 FINDINGS: Heart size and mediastinal contours are within normal limits. No suspicious pulmonary opacities identified. No pleural effusion or pneumothorax visualized. No acute osseous abnormality appreciated. IMPRESSION: No acute intrathoracic process identified. Electronically  Signed   By: Ofilia Neas M.D.   On: 08/10/2021 11:10   ECHOCARDIOGRAM COMPLETE  Result Date: 08/11/2021    ECHOCARDIOGRAM REPORT   Patient Name:   KHAMRYN BRITO Date of Exam: 08/11/2021 Medical Rec #:  [id number]      Height:       71.0 in Accession #:    [id number]     Weight:       181.2 lb Date of Birth:  [birth year]      BSA:          2.022 m Patient Age:    61 years       BP:           103/64 mmHg Patient Gender: M              HR:           66 bpm. Exam Location:  Inpatient Procedure: 2D Echo, Cardiac Doppler and Color Doppler Indications:    NSTEMI  History:        Patient has no prior history of Echocardiogram examinations.  Sonographer:    Glo Herring Referring Phys: [place]  1. Left ventricular ejection fraction, by estimation, is 65 to 70%. The left ventricle has hyperdynamic function. The left ventricle has no regional wall motion abnormalities. There is moderate left ventricular hypertrophy. Left ventricular diastolic parameters are consistent with Grade II diastolic dysfunction (pseudonormalization).  2. Right ventricular systolic function is normal. The right ventricular size is normal. Tricuspid regurgitation signal is inadequate for assessing PA pressure.  3. The mitral valve is normal in structure. Trivial mitral valve regurgitation. No evidence of mitral stenosis.  4. The aortic valve is tricuspid. Aortic valve regurgitation is not visualized. No aortic stenosis is present.  5. The inferior vena cava is normal in size with <50% respiratory variability, suggesting right atrial pressure of 8 mmHg. FINDINGS  Left Ventricle: Left ventricular ejection fraction, by estimation, is 65 to 70%. The left ventricle has hyperdynamic function. The left ventricle has no regional wall motion abnormalities. The left ventricular internal cavity size was normal in size. There is moderate left ventricular hypertrophy. Left ventricular diastolic parameters are consistent with  Grade II diastolic dysfunction (pseudonormalization). Right Ventricle: The right ventricular size is normal. No increase in right ventricular wall thickness. Right ventricular systolic function is normal. Tricuspid regurgitation signal is inadequate for assessing PA pressure. Left Atrium: Left atrial size was normal in size. Right Atrium: Right atrial size was normal in size. Pericardium: There is  no evidence of pericardial effusion. Mitral Valve: The mitral valve is normal in structure. Trivial mitral valve regurgitation. No evidence of mitral valve stenosis. Tricuspid Valve: The tricuspid valve is normal in structure. Tricuspid valve regurgitation is not demonstrated. Aortic Valve: The aortic valve is tricuspid. Aortic valve regurgitation is not visualized. No aortic stenosis is present. Aortic valve mean gradient measures 4.0 mmHg. Aortic valve peak gradient measures 7.3 mmHg. Aortic valve area, by VTI measures 2.09 cm. Pulmonic Valve: The pulmonic valve was normal in structure. Pulmonic valve regurgitation is not visualized. Aorta: The aortic root is normal in size and structure. Venous: The inferior vena cava is normal in size with less than 50% respiratory variability, suggesting right atrial pressure of 8 mmHg. IAS/Shunts: No atrial level shunt detected by color flow Doppler.  LEFT VENTRICLE PLAX 2D LVIDd:         4.40 cm   Diastology LVIDs:         2.80 cm   LV e' medial:    6.31 cm/s LV PW:         1.30 cm   LV E/e' medial:  11.9 LV IVS:        1.50 cm   LV e' lateral:   11.40 cm/s LVOT diam:     2.00 cm   LV E/e' lateral: 6.6 LV SV:         64 LV SV Index:   32 LVOT Area:     3.14 cm  RIGHT VENTRICLE             IVC RV Basal diam:  3.80 cm     IVC diam: 2.20 cm RV Mid diam:    2.20 cm RV S prime:     14.80 cm/s LEFT ATRIUM             Index        RIGHT ATRIUM           Index LA diam:        4.10 cm 2.03 cm/m   RA Area:     17.70 cm LA Vol (A2C):   54.5 ml 26.95 ml/m  RA Volume:   45.90 ml  22.70  ml/m LA Vol (A4C):   59.6 ml 29.48 ml/m LA Biplane Vol: 57.8 ml 28.59 ml/m  AORTIC VALVE                    PULMONIC VALVE AV Area (Vmax):    2.30 cm     PV Vmax:       1.28 m/s AV Area (Vmean):   2.17 cm     PV Peak grad:  6.6 mmHg AV Area (VTI):     2.09 cm AV Vmax:           135.00 cm/s AV Vmean:          94.900 cm/s AV VTI:            0.306 m AV Peak Grad:      7.3 mmHg AV Mean Grad:      4.0 mmHg LVOT Vmax:         99.00 cm/s LVOT Vmean:        65.600 cm/s LVOT VTI:          0.204 m LVOT/AV VTI ratio: 0.67  AORTA Ao Root diam: 3.60 cm MITRAL VALVE MV Area (PHT): 4.24 cm    SHUNTS MV Decel Time: 179 msec    Systemic VTI:  0.20 m  MV E velocity: 75.20 cm/s  Systemic Diam: 2.00 cm MV A velocity: 59.70 cm/s MV E/A ratio:  1.26 Dalton McleanMD Electronically signed by Franki Monte Signature Date/Time: 08/11/2021/3:48:46 PM    Final    Disposition   Pt is being discharged home today in good condition.  Follow-up Plans & Appointments   Call [facility] at [phone] if any bleeding, swelling or drainage at cath site.  May shower, no tub baths for 48 hours for groin sticks. No lifting over 5 pounds for 3 days.  No Driving for 3 days, no sauna for 3 days.  No exercise for 3 days to allow healing of cath site.   When in sauna be careful with new meds.  Limit time initially.    We have ordered cardiac MRI to eval your heart muscle.  Our office will call and arrange the date and time.  This will help [place] understand your thick heart muscle and EKG changes.    Heart healthy diet.   Take 1 NTG, under your tongue, while sitting or lying down.  If no relief of pain may repeat NTG, one tab every 5 minutes up to 3 tablets total over 15 minutes.  If no relief CALL 911.  If you have dizziness/lightheadness  while taking NTG, stop taking and call 911.      Call the office if symptoms return or any questions.   Follow-up Information     Croitoru, Mihai, MD Follow up on 08/30/2021.    Specialty: Cardiology Why: at 10:05 with his nurse practitioner Coletta Memos Contact information: [address] [place] [address] [phone]                  Discharge Medications   Allergies as of 08/12/2021   No Known Allergies      Medication List     TAKE these medications    aspirin 81 MG EC tablet Take 1 tablet (81 mg total) by mouth daily. Swallow whole. Start taking on: August 13, 2021   b complex vitamins tablet Take 1 tablet by mouth daily.   metoprolol tartrate 25 MG tablet Commonly known as: LOPRESSOR Take 1 tablet (25 mg total) by mouth 2 (two) times daily.   nitroGLYCERIN 0.4 MG SL tablet Commonly known as: NITROSTAT Place 1 tablet (0.4 mg total) under the tongue every 5 (five) minutes x 3 doses as needed for chest pain.   rosuvastatin 40 MG tablet Commonly known as: CRESTOR Take 1 tablet (40 mg total) by mouth daily. Start taking on: August 13, 2021 What changed:  how much to take how to take this when to take this additional instructions   VITAMIN C PO Take 1 tablet by mouth daily.   VITAMIN D PO Take 1,000 Units by mouth daily. Takes 4 capsules daily           Outstanding Labs/Studies   Cardiac MRI orders placed and message sent to scheduling  Repeat hepatic and lipid in 8 weeks  Duration of Discharge Encounter   Greater than 30 minutes including physician time.  Signed, Cecilie Kicks, NP 08/12/2021, 10:52 AM

## 2021-08-12 NOTE — Discharge Instructions (Addendum)
Call [facility] at [phone] if any bleeding, swelling or drainage at cath site.  May shower, no tub baths for 48 hours for groin sticks. No lifting over 5 pounds for 3 days.  No Driving for 3 days, no sauna for 3 days.  No exercise for 3 days to allow healing of cath site.   When in sauna be careful with new meds.  Limit time initially.    We have ordered cardiac MRI to eval your heart muscle.  Our office will call and arrange the date and time.  This will help [place] understand your thick heart muscle and EKG changes.    Heart healthy diet.   Take 1 NTG, under your tongue, while sitting or lying down.  If no relief of pain may repeat NTG, one tab every 5 minutes up to 3 tablets total over 15 minutes.  If no relief CALL 911.  If you have dizziness/lightheadness  while taking NTG, stop taking and call 911.        Call the office if symptoms return or any questions.

## 2021-08-12 NOTE — Progress Notes (Addendum)
Progress Note  Patient Name: Dave Rogers Date of Encounter: 08/12/2021  [facility] Cardiologist: Thurmon Fair, MD   Subjective   Dave Rogers had many questions about his cath and diagnosis. This was explained.  Crt normal  Inpatient Medications    Scheduled Meds:  aspirin EC  81 mg Oral Daily   metoprolol tartrate  25 mg Oral BID   rosuvastatin  40 mg Oral Daily   sodium chloride flush  3 mL Intravenous Q12H   sodium chloride flush  3 mL Intravenous Q12H   Continuous Infusions:  sodium chloride     sodium chloride Stopped (08/11/21 2334)   sodium chloride     nitroGLYCERIN Stopped (08/11/21 1816)   PRN Meds: sodium chloride, sodium chloride, acetaminophen, acetaminophen, ALPRAZolam, nitroGLYCERIN, ondansetron (ZOFRAN) IV, sodium chloride flush, sodium chloride flush, zolpidem   Vital Signs    Vitals:   08/11/21 2336 08/12/21 0302 08/12/21 0435 08/12/21 0815  BP: 116/72 106/75  119/77  Pulse: 61 (!) 54  67  Resp: (!) 21 20    Temp: 98.5 F (36.9 C) 98.3 F (36.8 C)  98.7 F (37.1 C)  TempSrc: Oral Oral  Oral  SpO2: 95% 95%  95%  Weight:   83.1 kg   Height:        Intake/Output Summary (Last 24 hours) at 08/12/2021 0854 Last data filed at 08/12/2021 0300 Gross per 24 hour  Intake 2976.11 ml  Output 2725 ml  Net 251.11 ml   Last 3 Weights 08/12/2021 08/11/2021 08/10/2021  Weight (lbs) 183 lb 3.2 oz 181 lb 3.5 oz 183 lb 4.8 oz  Weight (kg) 83.1 kg 82.2 kg 83.144 kg      Telemetry    Sinus rhythm, ST depression lead II unchanged- Personally Reviewed  ECG    Sinus rhythm, anterolateral deep TWI ; minimal voltage criteria for LVH  - Personally Reviewed  Physical Exam   Vitals:   08/12/21 0302 08/12/21 0815  BP: 106/75 119/77  Pulse: (!) 54 67  Resp: 20   Temp: 98.3 F (36.8 C) 98.7 F (37.1 C)  SpO2: 95% 95%    GEN: No acute distress.   Neck: No sig JVD Cardiac: RRR, no murmurs, rubs, or gallops.  Respiratory: Nl wob, Clear to  auscultation bilaterally. GI:non-distended  MS: No edema; No deformity. EXT: right radial pulse 2+, no hematom Neuro:  Nonfocal  Skin: warm and well perfused Psych: Normal affect   Labs    High Sensitivity Troponin:   Recent Labs  Lab 08/10/21 1054 08/10/21 1253 08/10/21 1711 08/10/21 1946  TROPONINIHS 18* 18* 17 18*     Chemistry Recent Labs  Lab 08/10/21 1054 08/11/21 0149 08/12/21 0057  NA 141 135 140  K 4.3 3.8 3.8  CL 103 104 107  CO2 27 24 23   GLUCOSE 102* 98 86  BUN 11 9 9   CREATININE 0.86 0.88 0.86  CALCIUM 11.0* 9.9 9.8  PROT  --  5.7*  --   ALBUMIN  --  3.6  --   AST  --  19  --   ALT  --  19  --   ALKPHOS  --  49  --   BILITOT  --  0.8  --   GFRNONAA >60 >60 >60  ANIONGAP 11 7 10     Lipids  Recent Labs  Lab 08/11/21 0149  CHOL 123  TRIG 98  HDL 45  LDLCALC 58  CHOLHDL 2.7    Hematology Recent Labs  Lab  08/10/21 1054 08/11/21 0149 08/12/21 0057  WBC 3.2* 4.0 3.9*  RBC 4.92 4.35 4.16*  HGB 15.5 13.9 13.5  HCT 44.1 39.1 37.8*  MCV 89.6 89.9 90.9  MCH 31.5 32.0 32.5  MCHC 35.1 35.5 35.7  RDW 13.2 13.1 13.2  PLT 212 173 164   Thyroid  Recent Labs  Lab 08/11/21 0149  TSH 3.384    BNPNo results for input(s): BNP, PROBNP in the last 168 hours.  DDimer No results for input(s): DDIMER in the last 168 hours.   Radiology    CARDIAC CATHETERIZATION  Result Date: 08/11/2021   Ramus lesion is 20% stenosed.   Prox LAD lesion is 20% stenosed.   Prox RCA lesion is 15% stenosed.   There is hyperdynamic left ventricular systolic function.   The left ventricular ejection fraction is greater than 65% by visual estimate. Mild coronary calcification with mild nonobstructive CAD. Hyperdynamic LV function with at least moderate left ventricular hypertrophy.  EF estimate greater than 65%.  LVEDP 14 mmHg. RECOMMENDATION: Catheterization demonstrates mild coronary calcification without  significant coronary obstructive disease.  Consider potential for  microvascular angina in the etiology of his chest discomfort.  ECG changes may be contributed by left ventricular hypertrophy/repolarization.  Consider possible low-dose beta-blocker or ARB therapy for improvement in endothelial function versus amlodipine.  DG Chest Portable 1 View  Result Date: 08/10/2021 CLINICAL DATA:  Chest pain EXAM: PORTABLE CHEST 1 VIEW COMPARISON:  Chest x-ray 07/29/2019 FINDINGS: Heart size and mediastinal contours are within normal limits. No suspicious pulmonary opacities identified. No pleural effusion or pneumothorax visualized. No acute osseous abnormality appreciated. IMPRESSION: No acute intrathoracic process identified. Electronically Signed   By: Jannifer Hick M.D.   On: 08/10/2021 11:10   ECHOCARDIOGRAM COMPLETE  Result Date: 08/11/2021    ECHOCARDIOGRAM REPORT   Patient Name:   Dave Rogers Date of Exam: 08/11/2021 Medical Rec #:  [id number]      Height:       71.0 in Accession #:    [id number]     Weight:       181.2 lb Date of Birth:  [birth year]      BSA:          2.022 m Patient Age:    60 years       BP:           103/64 mmHg Patient Gender: M              HR:           66 bpm. Exam Location:  Inpatient Procedure: 2D Echo, Cardiac Doppler and Color Doppler Indications:    NSTEMI  History:        Patient has no prior history of Echocardiogram examinations.  Sonographer:    Vanetta Shawl Referring Phys: [id number] RHONDA G BARRETT IMPRESSIONS  1. Left ventricular ejection fraction, by estimation, is 65 to 70%. The left ventricle has hyperdynamic function. The left ventricle has no regional wall motion abnormalities. There is moderate left ventricular hypertrophy. Left ventricular diastolic parameters are consistent with Grade II diastolic dysfunction (pseudonormalization).  2. Right ventricular systolic function is normal. The right ventricular size is normal. Tricuspid regurgitation signal is inadequate for assessing PA pressure.  3. The mitral valve is normal in  structure. Trivial mitral valve regurgitation. No evidence of mitral stenosis.  4. The aortic valve is tricuspid. Aortic valve regurgitation is not visualized. No aortic stenosis is present.  5. The inferior vena cava is normal in size  with <50% respiratory variability, suggesting right atrial pressure of 8 mmHg. FINDINGS  Left Ventricle: Left ventricular ejection fraction, by estimation, is 65 to 70%. The left ventricle has hyperdynamic function. The left ventricle has no regional wall motion abnormalities. The left ventricular internal cavity size was normal in size. There is moderate left ventricular hypertrophy. Left ventricular diastolic parameters are consistent with Grade II diastolic dysfunction (pseudonormalization). Right Ventricle: The right ventricular size is normal. No increase in right ventricular wall thickness. Right ventricular systolic function is normal. Tricuspid regurgitation signal is inadequate for assessing PA pressure. Left Atrium: Left atrial size was normal in size. Right Atrium: Right atrial size was normal in size. Pericardium: There is no evidence of pericardial effusion. Mitral Valve: The mitral valve is normal in structure. Trivial mitral valve regurgitation. No evidence of mitral valve stenosis. Tricuspid Valve: The tricuspid valve is normal in structure. Tricuspid valve regurgitation is not demonstrated. Aortic Valve: The aortic valve is tricuspid. Aortic valve regurgitation is not visualized. No aortic stenosis is present. Aortic valve mean gradient measures 4.0 mmHg. Aortic valve peak gradient measures 7.3 mmHg. Aortic valve area, by VTI measures 2.09 cm. Pulmonic Valve: The pulmonic valve was normal in structure. Pulmonic valve regurgitation is not visualized. Aorta: The aortic root is normal in size and structure. Venous: The inferior vena cava is normal in size with less than 50% respiratory variability, suggesting right atrial pressure of 8 mmHg. IAS/Shunts: No atrial level  shunt detected by color flow Doppler.  LEFT VENTRICLE PLAX 2D LVIDd:         4.40 cm   Diastology LVIDs:         2.80 cm   LV e' medial:    6.31 cm/s LV PW:         1.30 cm   LV E/e' medial:  11.9 LV IVS:        1.50 cm   LV e' lateral:   11.40 cm/s LVOT diam:     2.00 cm   LV E/e' lateral: 6.6 LV SV:         64 LV SV Index:   32 LVOT Area:     3.14 cm  RIGHT VENTRICLE             IVC RV Basal diam:  3.80 cm     IVC diam: 2.20 cm RV Mid diam:    2.20 cm RV S prime:     14.80 cm/s LEFT ATRIUM             Index        RIGHT ATRIUM           Index LA diam:        4.10 cm 2.03 cm/m   RA Area:     17.70 cm LA Vol (A2C):   54.5 ml 26.95 ml/m  RA Volume:   45.90 ml  22.70 ml/m LA Vol (A4C):   59.6 ml 29.48 ml/m LA Biplane Vol: 57.8 ml 28.59 ml/m  AORTIC VALVE                    PULMONIC VALVE AV Area (Vmax):    2.30 cm     PV Vmax:       1.28 m/s AV Area (Vmean):   2.17 cm     PV Peak grad:  6.6 mmHg AV Area (VTI):     2.09 cm AV Vmax:           135.00 cm/s  AV Vmean:          94.900 cm/s AV VTI:            0.306 m AV Peak Grad:      7.3 mmHg AV Mean Grad:      4.0 mmHg LVOT Vmax:         99.00 cm/s LVOT Vmean:        65.600 cm/s LVOT VTI:          0.204 m LVOT/AV VTI ratio: 0.67  AORTA Ao Root diam: 3.60 cm MITRAL VALVE MV Area (PHT): 4.24 cm    SHUNTS MV Decel Time: 179 msec    Systemic VTI:  0.20 m MV E velocity: 75.20 cm/s  Systemic Diam: 2.00 cm MV A velocity: 59.70 cm/s MV E/A ratio:  1.26 Dalton McleanMD Electronically signed by Wilfred Lacy Signature Date/Time: 08/11/2021/3:48:46 PM    Final     Cardiac Studies  Echo 08/11/2021 Normal LV function Moderate concentric hypertrophy No valve dx RA pressure 8  LHC 08/11/2021   Ramus lesion is 20% stenosed.   Prox LAD lesion is 20% stenosed.   Prox RCA lesion is 15% stenosed.   There is hyperdynamic left ventricular systolic function.   The left ventricular ejection fraction is greater than 65% by visual estimate.   Mild coronary calcification  with mild nonobstructive CAD.   Hyperdynamic LV function with at least moderate left ventricular hypertrophy.  EF estimate greater than 65%.  LVEDP 14 mmHg.   RECOMMENDATION: Catheterization demonstrates mild coronary calcification without  significant coronary obstructive disease.  Consider potential for microvascular angina in the etiology of his chest discomfort.  ECG changes may be contributed by left ventricular hypertrophy/repolarization.  Consider possible low-dose beta-blocker or ARB therapy for improvement in endothelial function versus amlodipine  Patient Profile     61 y.o. male hx of HLD, MGF with early onset CAD, presented with typical chest pain , deep TWI laterally, found to have non obstructive CAD; c/f microvascular dx  Assessment & Plan    Microvascular disease: Presented with acute chest pain. troponin very mild however presentation classic for angina. He has family hx and HLD. His EKG is concerning for ischemia. LHC showed non obstructive dx. Concerning for microvascular disease. Will continue on his BB. Blood pressure looks good here. Does have moderate LVH; seems out of proportion with current blood pressures. Can re-assess outpatient with consideration or ARB or amlodipine if blood pressures not controlled. - continue asa 81 mg daily - continue metop 25 mg tartrate BID - continue crestor 40 mg daily - nitro SL PRN - LDL 58 at goal - no lifting > 10 pounds for two weeks - Discharge today   I spent > 30 minutes on hospital discharge  For questions or updates, please contact [facility] Please consult [email or web address] for contact info under        Signed, Maisie Fus, MD  08/12/2021, 8:54 AM

## 2021-08-16 ENCOUNTER — Telehealth: Payer: Self-pay

## 2021-08-16 DIAGNOSIS — I214 Non-ST elevation (NSTEMI) myocardial infarction: Secondary | ICD-10-CM

## 2021-08-16 DIAGNOSIS — I428 Other cardiomyopathies: Secondary | ICD-10-CM

## 2021-08-16 NOTE — Telephone Encounter (Signed)
Order placed for MRI at this time. Sent message to [facility] pool to schedule pt for OV with Excell Seltzer once MRI is completed and resulted.  Tonny Bollman, MD  P Cv [facility] Triage This is a personal friend of mine. He was admitted last week with chest pain and had cardiac cath. He has asked me to take over his care from a cardiac standpoint and I agreed to be his cardiologist at his request. Please place orders for cardiac MRI to evaluate hypertrophic cardiomyopathy. Please schedule him for an office visit with me after the MRI is completed. OK to put him on to a first of afternoon slot of 1:20 pm or end of day slot at 4:20 or 4:40pm whatever works for availability.   Thx Kathlene November

## 2021-08-16 NOTE — Telephone Encounter (Signed)
-----   Message from Tonny Bollman, MD sent at 08/16/2021  2:20 PM EST ----- This is a personal friend of mine. He was admitted last week with chest pain and had cardiac cath. He has asked me to take over his care from a cardiac standpoint and I agreed to be his cardiologist at his request. Please place orders for cardiac MRI to evaluate hypertrophic cardiomyopathy. Please schedule him for an office visit with me after the MRI is completed. OK to put him on to a first of afternoon slot of 1:20 pm or end of day slot at 4:20 or 4:40pm whatever works for availability.   Thx Kathlene November

## 2021-08-25 ENCOUNTER — Encounter: Payer: Self-pay | Admitting: Internal Medicine

## 2021-08-25 NOTE — Progress Notes (Signed)
Annual  Screening/Preventative Visit  & Comprehensive Evaluation & Examination  Future Appointments  Date Time Provider Department  08/26/2021 10:00 AM Lucky Cowboy, MD [facility]  09/01/2022 10:00 AM Lucky Cowboy, MD [facility]            This very nice 61 y.o. MWM presents for a Screening /Preventative Visit & comprehensive evaluation and management of multiple medical co-morbidities.  Patient has been followed for elevated  BP, HLD, Prediabetes and Vitamin D Deficiency.  Aortic Atherosclerosis  & Coronary artery calcifications were noted on Chest CT scan on 07/30/2019.        Patient has been monitored expectantly for elevated BP.  Today's BP is at goal -  128/78.  Patient was recently hospitalized Jan 24-26 by Dr Royann Shivers with  suspect Unstable Angina & Heart cath showed only mild coronary calcification without  significant coronary obstructive disease.  Echocardiogram showed EF 65-70%.  EKG changes were attributed to LVH.   At discharge , patient was scheduled for Cardiac MRI. Patient denies any cardiac symptoms as palpitations, shortness of breath, dizziness or ankle swelling.       Patient's hyperlipidemia is controlled with diet and medications. Patient denies myalgias or other medication SE's. Last lipids were at goal :  Lab Results  Component Value Date   CHOL 123 08/11/2021   HDL 45 08/11/2021   LDLCALC 58 08/11/2021   TRIG 98 08/11/2021   CHOLHDL 2.7 08/11/2021         Patient has also been monitored for abnormal elevated glucose.   Patient denies reactive hypoglycemic symptoms, visual blurring, diabetic polys or paresthesias. Recent A1c was Normal & at goal :   Lab Results  Component Value Date   HGBA1C 5.0 08/11/2021          Finally, patient has history of Vitamin D Deficiency ("19" /2019) and last vitamin D was still low :   Lab Results  Component Value Date   VD25OH 37 08/18/2020     Current Outpatient Medications on File Prior to Visit   Medication Sig   VITAMIN C  Take 1 tablet  daily.   aspirin EC 81 MG EC tablet Take 1 tablet daily   B complex vitamins tablet Take 1 tablet daily.   metoprolol tartrate  25 MG tablet Take 1 tablet 2  times daily.   NITROSTAT 0.4 MG SL tablet as needed for chest pain.   rosuvastatin 40 MG tablet Take 1 tablet  daily.   VITAMIN D  1,000 u Take 4,000 Units daily.     No Known Allergies   Past Medical History:  Diagnosis Date   Arthritis    CAD in native artery non obstructive disease 09/01/2019   Hyperlipidemia    LVH (left ventricular hypertrophy) 08/12/2021   Unstable angina (HCC) 08/12/2021     Health Maintenance  Topic Date Due   Hepatitis C Screening  Never done   Zoster Vaccines- Shingrix (1 of 2) Never done   COVID-19 Vaccine (3 - Pfizer risk series) 11/22/2019   INFLUENZA VACCINE  Never done   COLONOSCOPY ( 02/16/2023   TETANUS/TDAP  11/15/2029   HIV Screening  Completed   HPV VACCINES  Aged Out     Immunization History  Administered Date(s) Administered   PFIZER SARS-COV-2 Vacc 10/04/2019, 10/25/2019   PPD Test 02/19/2019, 08/18/2020   Tdap 11/16/2019    Last Colon - 2014 in [place] - & was advised 10 yr f/u due 2024   Past Surgical History:  Procedure Laterality Date   ANAL FISSURE REPAIR  2006   HERNIA REPAIR Right 2006   LEFT HEART CATH AND CORONARY ANGIOGRAPHY N/A 08/11/2021   Procedure: LEFT HEART CATH AND CORONARY ANGIOGRAPHY;  Surgeon: Troy Sine, MD;  Location: [facility];  Service: Cardiovascular;  Laterality: N/A;   TOTAL SHOULDER ARTHROPLASTY Right 08/01/2019   Procedure: TOTAL SHOULDER ARTHROPLASTY;  Surgeon: Tania Ade, MD;  Location: [facility];  Service: Orthopedics;  Laterality: Right;     Family History  Problem Relation Age of Onset   Dementia Mother    Cancer Father    Dementia Father    Heart attack Maternal Grandmother      Social History   Socioeconomic History   Marital status: Married      Spouse name:  Anderson Malta   Number of children: 1 daughter  Occupational History   Works in [employer] in [employer]   Tobacco Use   Smoking status: Never   Smokeless tobacco: Never  Vaping Use   Vaping Use: Never used  Substance Use Topics   Alcohol use: Yes    Alcohol/week: 6.0 standard drinks    Types: 3 Glasses of wine, 3 Cans of beer per week    Comment: ocassional   Drug use: No      ROS Constitutional: Denies fever, chills, weight loss/gain, headaches, insomnia,  night sweats or change in appetite. Does c/o fatigue. Eyes: Denies redness, blurred vision, diplopia, discharge, itchy or watery eyes.  ENT: Denies discharge, congestion, post nasal drip, epistaxis, sore throat, earache, hearing loss, dental pain, Tinnitus, Vertigo, Sinus pain or snoring.  Cardio: Denies chest pain, palpitations, irregular heartbeat, syncope, dyspnea, diaphoresis, orthopnea, PND, claudication or edema Respiratory: denies cough, dyspnea, DOE, pleurisy, hoarseness, laryngitis or wheezing.  Gastrointestinal: Denies dysphagia, heartburn, reflux, water brash, pain, cramps, nausea, vomiting, bloating, diarrhea, constipation, hematemesis, melena, hematochezia, jaundice or hemorrhoids Genitourinary: Denies dysuria, frequency, urgency, nocturia, hesitancy, discharge, hematuria or flank pain Musculoskeletal: Denies arthralgia, myalgia, stiffness, Jt. Swelling, pain, limp or strain/sprain. Denies Falls. Skin: Denies puritis, rash, hives, warts, acne, eczema or change in skin lesion Neuro: No weakness, tremor, incoordination, spasms, paresthesia or pain Psychiatric: Denies confusion, memory loss or sensory loss. Denies Depression. Endocrine: Denies change in weight, skin, hair change, nocturia, and paresthesia, diabetic polys, visual blurring or hyper / hypo glycemic episodes.  Heme/Lymph: No excessive bleeding, bruising or enlarged lymph nodes.   Physical Exam  BP 128/78    Pulse (!) 58    Temp 97.9 F (36.6 C)     Resp 16    Ht 5\' 11"  (1.803 m)    Wt 184 lb 9.6 oz (83.7 kg)    SpO2 97%    BMI 25.75 kg/m   General Appearance: Well nourished and well groomed and in no apparent distress.  Eyes: PERRLA, EOMs, conjunctiva no swelling or erythema, normal fundi and vessels. Sinuses: No frontal/maxillary tenderness ENT/Mouth: EACs patent / TMs  nl. Nares clear without erythema, swelling, mucoid exudates. Oral hygiene is good. No erythema, swelling, or exudate. Tongue normal, non-obstructing. Tonsils not swollen or erythematous. Hearing normal.  Neck: Supple, thyroid not palpable. No bruits, nodes or JVD. Respiratory: Respiratory effort normal.  BS equal and clear bilateral without rales, rhonci, wheezing or stridor. Cardio: Heart sounds are normal with regular rate and rhythm and no murmurs, rubs or gallops. Peripheral pulses are normal and equal bilaterally without edema. No aortic or femoral bruits. Chest: symmetric with normal excursions and percussion.  Abdomen: Soft,  with Nl bowel sounds. Nontender, no guarding, rebound, hernias, masses, or organomegaly.  Lymphatics: Non tender without lymphadenopathy.  Musculoskeletal: Full ROM all peripheral extremities, joint stability, 5/5 strength, and normal gait. Skin: Warm and dry without rashes, lesions, cyanosis, clubbing or  ecchymosis.  Neuro: Cranial nerves intact, reflexes equal bilaterally. Normal muscle tone, no cerebellar symptoms. Sensation intact.  Pysch: Alert and oriented X 3 with normal affect, insight and judgment appropriate.   Assessment and Plan  1. Annual Preventative/Screening Exam    2. Elevated BP without diagnosis of hypertension  - Magnesium - TSH - Urinalysis, Routine w reflex microscopic - Microalbumin / creatinine urine ratio  3. Hyperlipidemia, mixed   4. Abnormal glucose   5. Vitamin D deficiency  - VITAMIN D 25 Hydroxy   6. Aortic atherosclerosis ([place])   7. Coronary artery calcification   8. CAD in native  artery non obstructive disease   9. Screening-pulmonary TB  - TB Skin Test  10. Screening for colorectal cancer  - POC Hemoccult Bld/Stl  11. Prostate cancer screening  - PSA  12. Screening for ischemic heart disease   13. Screening for AAA (aortic abdominal aneurysm)   14. Fatigue  - Iron, Total/Total Iron Binding Cap - Vitamin B12 - Testosterone  15. Medication management  - VITAMIN D 25 Hydroxy          Patient was counseled in prudent diet, weight control to achieve/maintain BMI less than 25, BP monitoring, regular exercise and medications as discussed.  Discussed med effects and SE's. Routine screening labs and tests as requested with regular follow-up as recommended. Over 40 minutes of exam, counseling, chart review and high complex critical decision making was performed   Kirtland Bouchard, MD

## 2021-08-25 NOTE — Patient Instructions (Signed)

## 2021-08-26 ENCOUNTER — Encounter: Payer: Self-pay | Admitting: Internal Medicine

## 2021-08-26 ENCOUNTER — Other Ambulatory Visit: Payer: Self-pay

## 2021-08-26 ENCOUNTER — Ambulatory Visit (INDEPENDENT_AMBULATORY_CARE_PROVIDER_SITE_OTHER): Payer: No Typology Code available for payment source | Admitting: Internal Medicine

## 2021-08-26 VITALS — BP 128/78 | HR 58 | Temp 97.9°F | Resp 16 | Ht 71.0 in | Wt 184.6 lb

## 2021-08-26 DIAGNOSIS — R7309 Other abnormal glucose: Secondary | ICD-10-CM

## 2021-08-26 DIAGNOSIS — Z130 Encounter for screening for diseases of the blood and blood-forming organs and certain disorders involving the immune mechanism: Secondary | ICD-10-CM | POA: Diagnosis not present

## 2021-08-26 DIAGNOSIS — E559 Vitamin D deficiency, unspecified: Secondary | ICD-10-CM

## 2021-08-26 DIAGNOSIS — Z1329 Encounter for screening for other suspected endocrine disorder: Secondary | ICD-10-CM

## 2021-08-26 DIAGNOSIS — R350 Frequency of micturition: Secondary | ICD-10-CM | POA: Diagnosis not present

## 2021-08-26 DIAGNOSIS — R5383 Other fatigue: Secondary | ICD-10-CM

## 2021-08-26 DIAGNOSIS — Z1389 Encounter for screening for other disorder: Secondary | ICD-10-CM | POA: Diagnosis not present

## 2021-08-26 DIAGNOSIS — E782 Mixed hyperlipidemia: Secondary | ICD-10-CM

## 2021-08-26 DIAGNOSIS — Z0001 Encounter for general adult medical examination with abnormal findings: Secondary | ICD-10-CM

## 2021-08-26 DIAGNOSIS — Z0000 Encounter for general adult medical examination without abnormal findings: Secondary | ICD-10-CM | POA: Diagnosis not present

## 2021-08-26 DIAGNOSIS — R030 Elevated blood-pressure reading, without diagnosis of hypertension: Secondary | ICD-10-CM

## 2021-08-26 DIAGNOSIS — Z136 Encounter for screening for cardiovascular disorders: Secondary | ICD-10-CM

## 2021-08-26 DIAGNOSIS — Z111 Encounter for screening for respiratory tuberculosis: Secondary | ICD-10-CM | POA: Diagnosis not present

## 2021-08-26 DIAGNOSIS — N401 Enlarged prostate with lower urinary tract symptoms: Secondary | ICD-10-CM | POA: Diagnosis not present

## 2021-08-26 DIAGNOSIS — Z125 Encounter for screening for malignant neoplasm of prostate: Secondary | ICD-10-CM | POA: Diagnosis not present

## 2021-08-26 DIAGNOSIS — I2510 Atherosclerotic heart disease of native coronary artery without angina pectoris: Secondary | ICD-10-CM

## 2021-08-26 DIAGNOSIS — Z1211 Encounter for screening for malignant neoplasm of colon: Secondary | ICD-10-CM

## 2021-08-26 DIAGNOSIS — I700 Atherosclerosis of aorta: Secondary | ICD-10-CM

## 2021-08-26 DIAGNOSIS — Z79899 Other long term (current) drug therapy: Secondary | ICD-10-CM | POA: Diagnosis not present

## 2021-08-30 ENCOUNTER — Ambulatory Visit: Payer: PRIVATE HEALTH INSURANCE | Admitting: General Practice

## 2021-09-15 ENCOUNTER — Ambulatory Visit: Payer: No Typology Code available for payment source | Admitting: Internal Medicine

## 2021-09-15 NOTE — Progress Notes (Signed)
     C  A N  C  E L  L  E  D          

## 2021-09-17 ENCOUNTER — Telehealth (HOSPITAL_COMMUNITY): Payer: Self-pay | Admitting: Emergency Medicine

## 2021-09-17 ENCOUNTER — Encounter (HOSPITAL_COMMUNITY): Payer: Self-pay

## 2021-09-17 ENCOUNTER — Telehealth (HOSPITAL_COMMUNITY): Payer: Self-pay | Admitting: *Deleted

## 2021-09-17 NOTE — Telephone Encounter (Signed)
Patient returning call regarding upcoming cardiac imaging study; pt verbalizes understanding of appt date/time, parking situation and where to check in, and verified current allergies; name and call back number provided for further questions should they arise ? ?Larey Brick RN Navigator Cardiac Imaging ?[facility] ?[phone] office ?[phone] cell ? ?Denies any claustrophobia or difficult IV start.  States he had a shoulder replacement but denies any other metal. ?

## 2021-09-17 NOTE — Telephone Encounter (Signed)
Attempted to call patient regarding upcoming cardiac MR appointment. Left message on voicemail with name and callback number Amreen Raczkowski RN Navigator Cardiac Imaging [facility] [phone] Office [phone] Cell  

## 2021-09-20 ENCOUNTER — Ambulatory Visit (HOSPITAL_COMMUNITY): Admission: RE | Admit: 2021-09-20 | Payer: PRIVATE HEALTH INSURANCE | Source: Ambulatory Visit

## 2021-09-20 ENCOUNTER — Ambulatory Visit (HOSPITAL_COMMUNITY): Payer: PRIVATE HEALTH INSURANCE

## 2021-10-09 IMAGING — CT CT CHEST W/ CM
1 series · 15 of 34 positions shown, 19 images · IV contrast (APPLIED)
Comparison: Chest radiograph, 07/29/2019

CLINICAL DATA: Abnormal chest x-ray

EXAM:
CT CHEST WITH CONTRAST
TECHNIQUE: Multidetector CT imaging of the chest was performed during
intravenous contrast administration.
CONTRAST:  75mL [id number] IOPAMIDOL ([id number]) INJECTION 61%

[Series 2: chest w/cm · axial · 0.76mm/px · z∈[-316,-28]mm · 15 of 170 slices shown, 19 images]
[im 13/170  mediastinal]
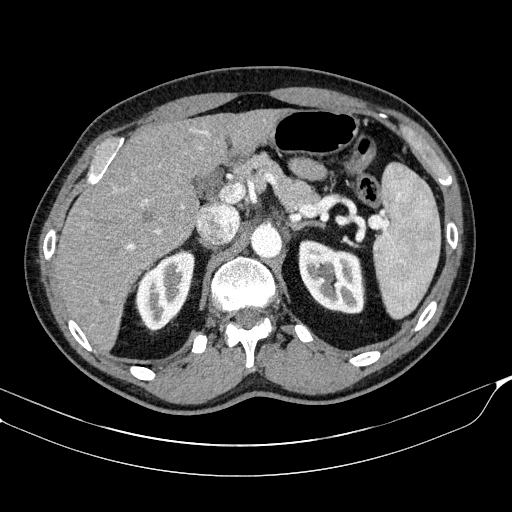
[im 13/170  lung]
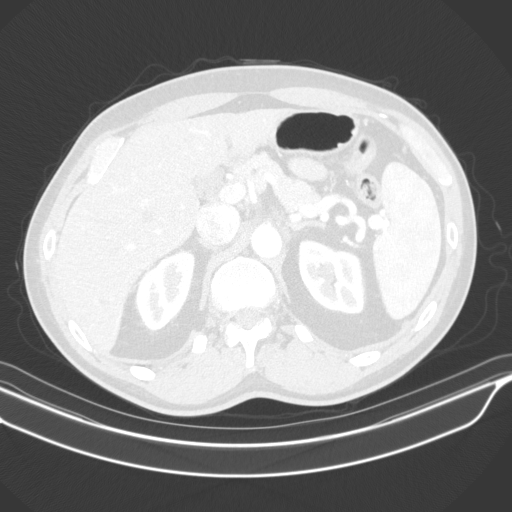
[im 26/170  lung]
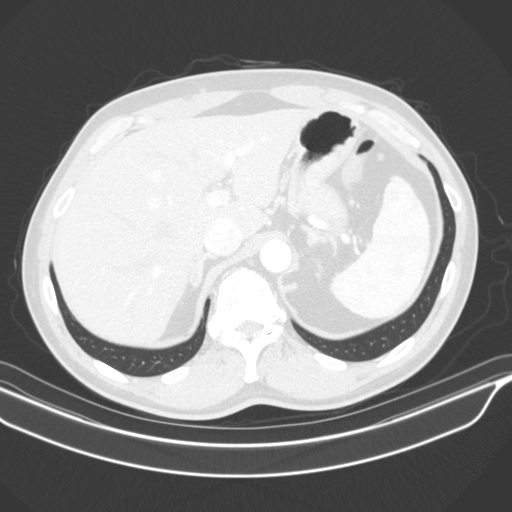
[im 34/170  lung]
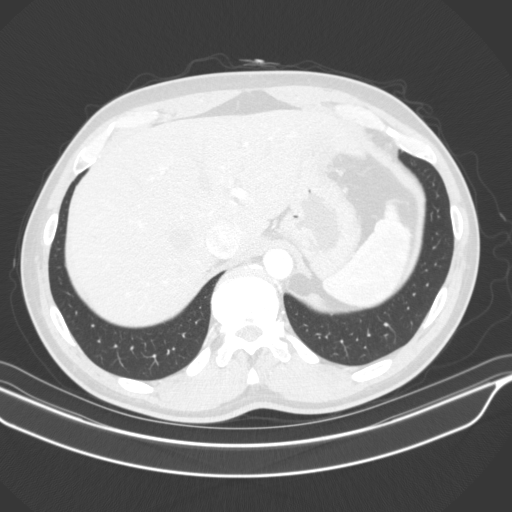
[im 44/170  lung]
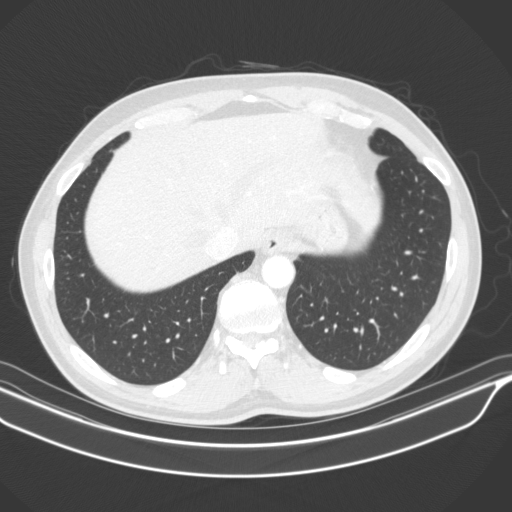
[im 57/170  mediastinal]
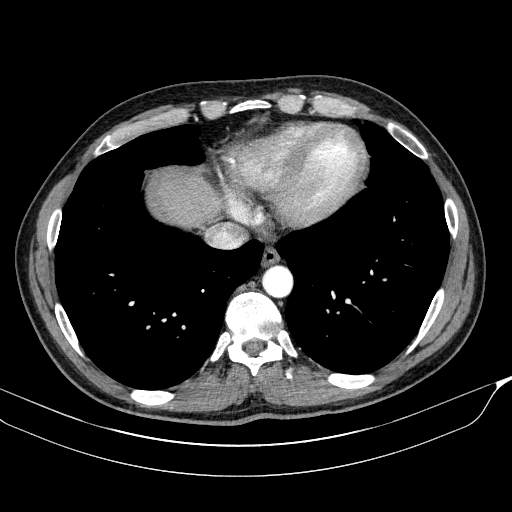
[im 57/170  lung]
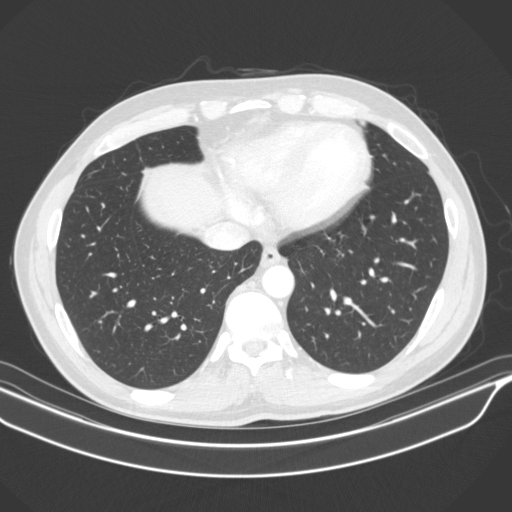
[im 68/170  lung]
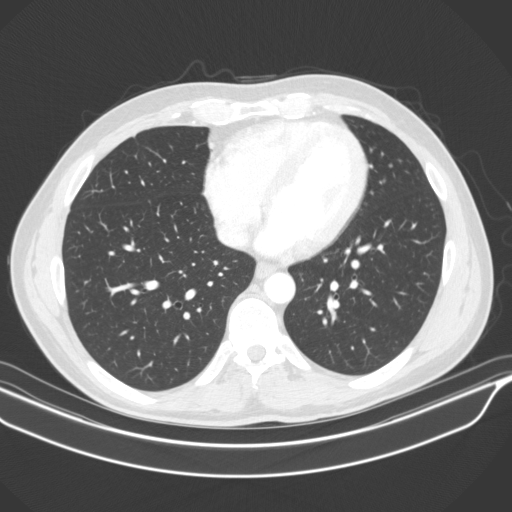
[im 76/170  lung]
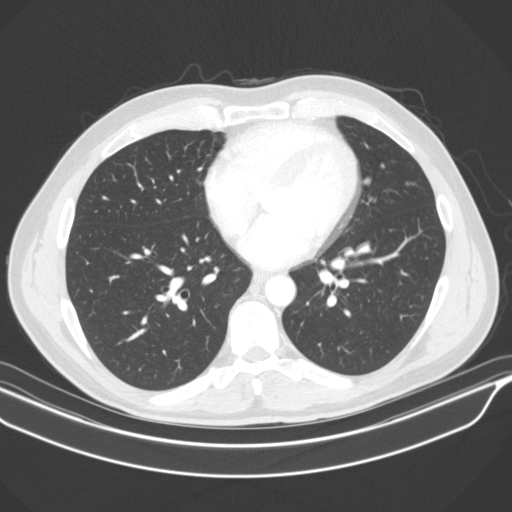
[im 88/170  lung]
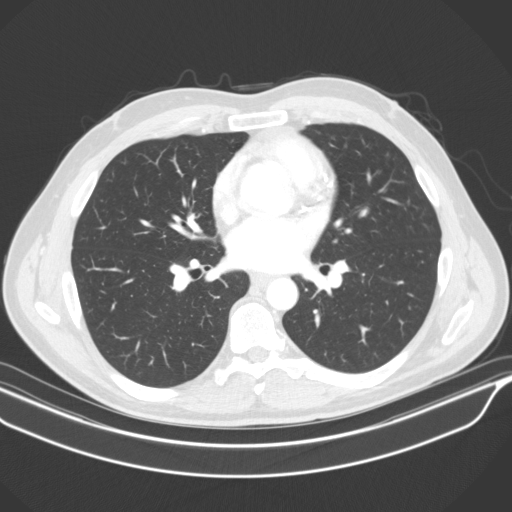
[im 94/170  mediastinal]
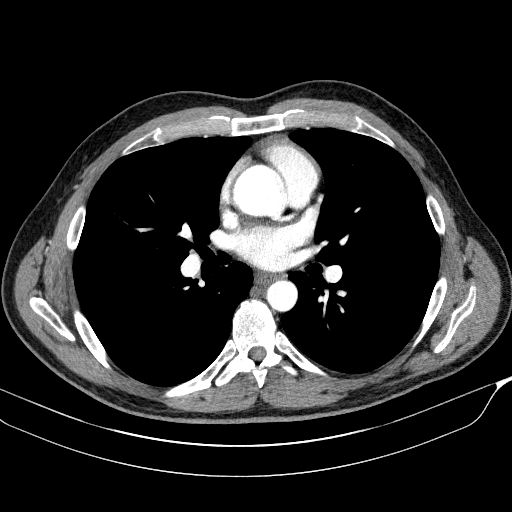
[im 94/170  lung]
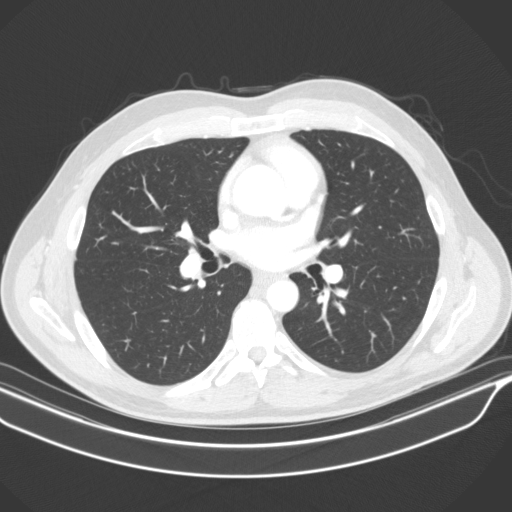
[im 102/170  lung]
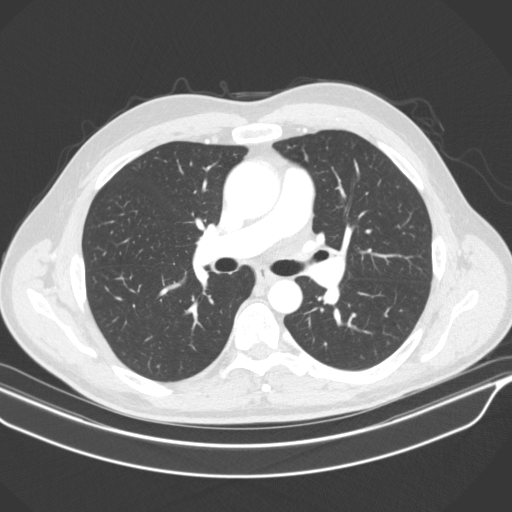
[im 113/170  lung]
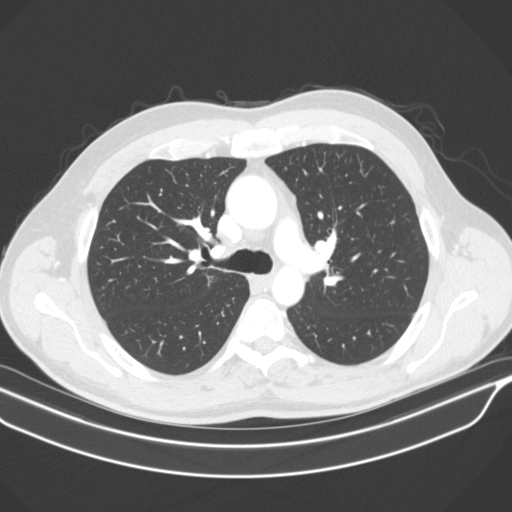
[im 126/170  lung]
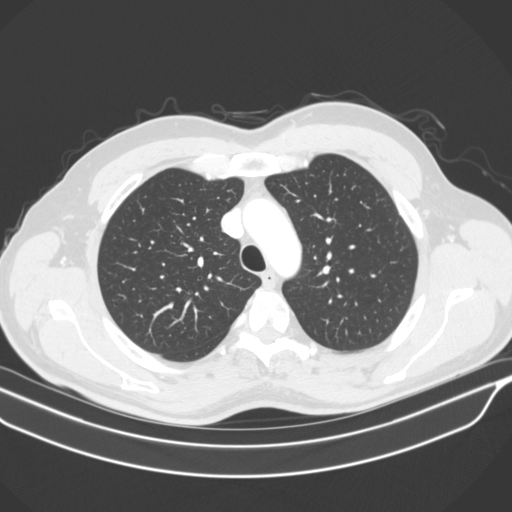
[im 136/170  mediastinal]
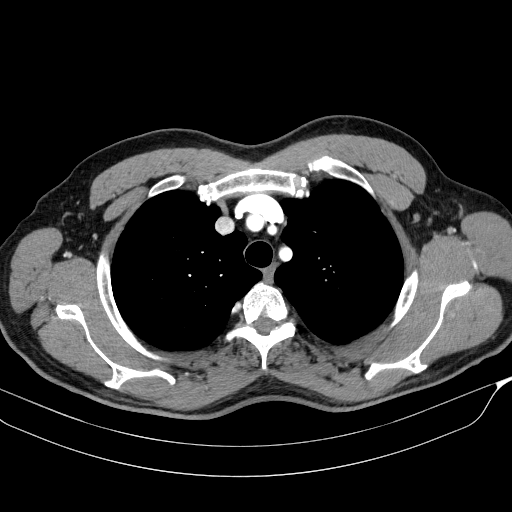
[im 136/170  lung]
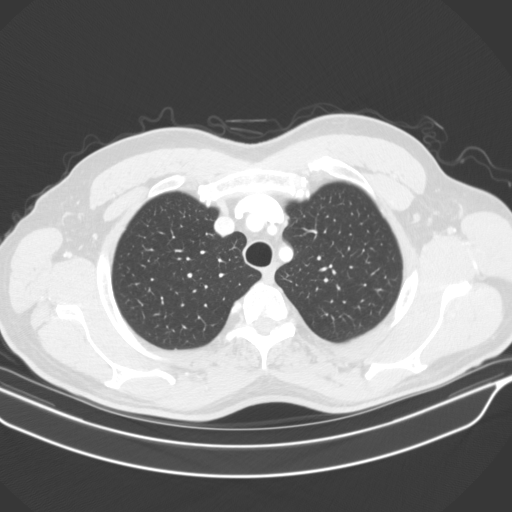
[im 144/170  lung]
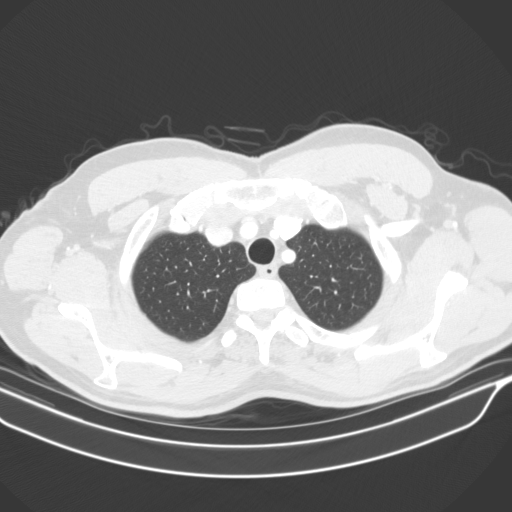
[im 157/170  lung]
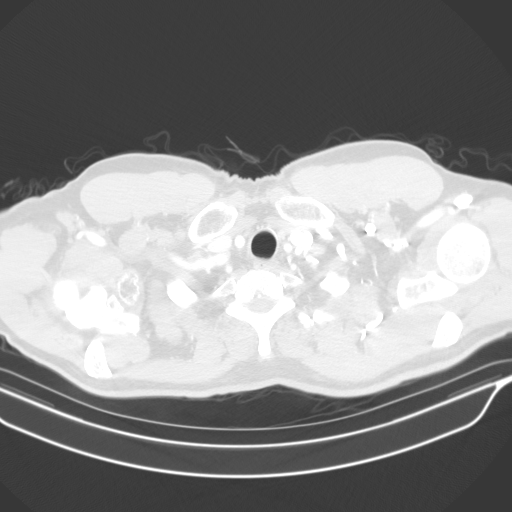

[15 of 34 positions shown; findings below may reference images not displayed]

FINDINGS: Cardiovascular: Enlargement of the tubular ascending thoracic aorta,
measuring up to 4.1 x 4.1 cm. Minimal aortic atherosclerosis normal
heart size. Three-vessel coronary artery calcifications. No
pericardial effusion.

Mediastinum/Nodes: No enlarged mediastinal, hilar, or axillary lymph
nodes. Thyroid gland, trachea, and esophagus demonstrate no
significant findings.

Lungs/Pleura: Lungs are clear. No pleural effusion or pneumothorax.

Upper Abdomen: No acute abnormality.

Musculoskeletal: No chest wall mass or suspicious bone lesions
identified.
IMPRESSION: 1. No pulmonary mass or nodule of the left lung to correspond to
suspected radiographic findings, likely explained by superimposition
of ribs on prior radiograph.
2. Enlargement of the tubular ascending thoracic aorta, measuring up
to 4.1 x 4.1 cm. The remainder of vessel is normal in caliber.
Minimal aortic atherosclerosis. Aortic Atherosclerosis
([id number]).
3. Three-vessel coronary artery calcifications.

## 2021-10-11 IMAGING — DX DG SHOULDER 2+V PORT*R*
1 series · 1 of 1 positions shown · non-contrast
Comparison: None.

CLINICAL DATA: Status post right shoulder replacement.

EXAM:
PORTABLE RIGHT SHOULDER

[shoulder ap]
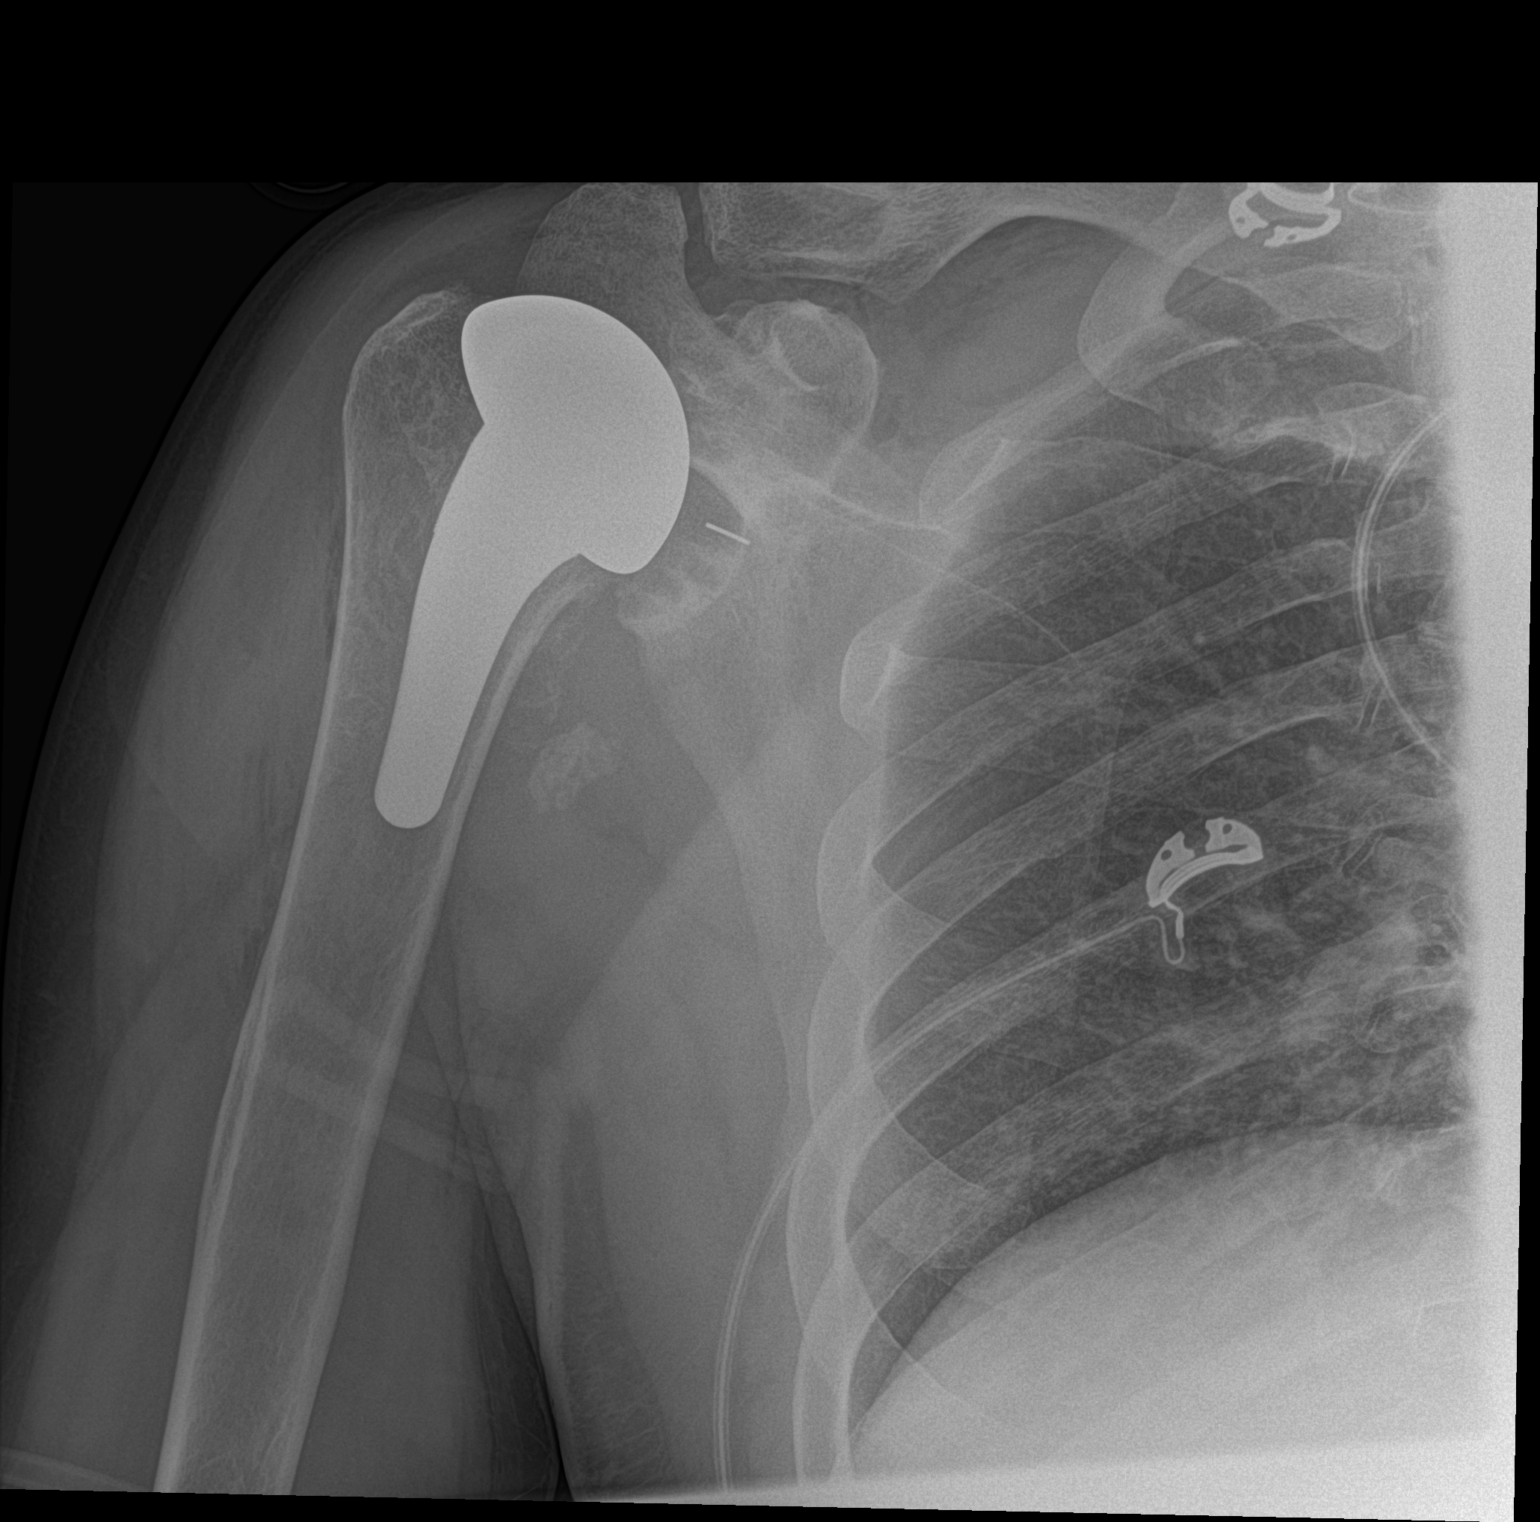

[1 of 1 positions shown; findings below may reference images not displayed]

FINDINGS: Single AP view demonstrates a right shoulder hemiarthroplasty. The
prosthesis appears well seated and well aligned with the glenoid.

No acute fracture. AC joint normally spaced and aligned. No
degenerative change.

No bone lesion.

Soft tissues demonstrate calcification or ossification inferior to
the glenohumeral joint and a small amount of air adjacent to the
lateral proximal humerus, presumed postoperative.
IMPRESSION: 1. Well seated and well aligned right shoulder hemiarthroplasty.
2. No acute fracture or evidence of an operative complication

## 2021-11-05 ENCOUNTER — Telehealth: Payer: Self-pay

## 2021-11-05 NOTE — Telephone Encounter (Signed)
Called and spoke with patient. States he didn't feel this was a "super acute" thing, but did want to be seen as soon as [facility] could accommodate. Scheduled for 5/1 at 4:40pm. ?

## 2021-11-05 NOTE — Telephone Encounter (Signed)
-----   Message from Tonny Bollman, MD sent at 11/04/2021 12:39 PM EDT ----- ?Another patient I need to get added on to a schedule. Follow-up chest pain. thanks ? ?

## 2021-11-15 ENCOUNTER — Ambulatory Visit: Payer: PRIVATE HEALTH INSURANCE | Admitting: Cardiovascular Disease

## 2021-11-15 ENCOUNTER — Encounter: Payer: Self-pay | Admitting: Cardiovascular Disease

## 2021-11-15 VITALS — BP 126/80 | HR 52 | Ht 71.0 in | Wt 181.6 lb

## 2021-11-15 DIAGNOSIS — I422 Other hypertrophic cardiomyopathy: Secondary | ICD-10-CM | POA: Diagnosis not present

## 2021-11-15 MED ORDER — ROSUVASTATIN CALCIUM 20 MG PO TABS: ORAL_TABLET | ORAL | 3 refills | Status: DC

## 2021-11-15 NOTE — Progress Notes (Signed)
?Cardiology Office Note:   ? ?Date:  11/18/2021  ? ?ID:  Dave Rogers, DOB 01-13-1961, MRN [id number] ? ?PCP:  Lucky Cowboy, MD ?  ?[facility] Providers ?Cardiologist:  Thurmon Fair, MD    ? ?Referring MD: Lucky Cowboy, MD  ? ?Chief Complaint  ?Patient presents with  ? Abnormal ECG  ? ? ?History of Present Illness:   ? ?Dave Rogers is a 61 y.o. male with a hx of abnormal EKG, left ventricular hypertrophy, and hospitalization for chest pain with minimally elevated troponin in January 2023.  He presents today for follow-up evaluation.  The patient underwent cardiac catheterization August 11, 2021 demonstrating normal LVEDP of 14 mmHg, mild nonobstructive coronary plaquing without significant stenosis, and hyperdynamic LV systolic function.  An echocardiogram during that same hospitalization showed hyperdynamic LV systolic function, grade 2 diastolic dysfunction with moderate LVH, normal RV function, and no significant valvular disease. ? ?The patient is here alone today.  He has discontinued aspirin and metoprolol.  He remains on rosuvastatin 40 mg daily but would like to go back to his previous dose of 20 mg every other day.  He has a constant substernal chest pressure that he relates to stress.  This is unaffected by physical activity and in fact he does not notice it much when he is exercising.  No shortness of breath, heart palpitations, edema, orthopnea, or PND. ? ?Past Medical History:  ?Diagnosis Date  ? Arthritis   ? CAD in native artery non obstructive disease 09/01/2019  ? Hyperlipidemia   ? LVH (left ventricular hypertrophy) 08/12/2021  ? Unstable angina (HCC) 08/12/2021  ? ? ?Past Surgical History:  ?Procedure Laterality Date  ? ANAL FISSURE REPAIR  2006  ? HERNIA REPAIR Right 2006  ? LEFT HEART CATH AND CORONARY ANGIOGRAPHY N/A 08/11/2021  ? Procedure: LEFT HEART CATH AND CORONARY ANGIOGRAPHY;  Surgeon: Lennette Bihari, MD;  Location: [facility] INVASIVE CV LAB;  Service: Cardiovascular;  Laterality:  N/A;  ? TOTAL SHOULDER ARTHROPLASTY Right 08/01/2019  ? Procedure: TOTAL SHOULDER ARTHROPLASTY;  Surgeon: Jones Broom, MD;  Location: [facility];  Service: Orthopedics;  Laterality: Right;  ? ? ?Current Medications: ?Current Meds  ?Medication Sig  ? rosuvastatin (CRESTOR) 20 MG tablet Take 1 tablet by mouth every other day  ? [DISCONTINUED] rosuvastatin (CRESTOR) 40 MG tablet Take 1 tablet (40 mg total) by mouth daily.  ?  ? ?Allergies:   Patient has no known allergies.  ? ?Social History  ? ?Socioeconomic History  ? Marital status: Married  ?  Spouse name: Not on file  ? Number of children: 1  ? Years of education: Not on file  ? Highest education level: Not on file  ?Occupational History  ? Not on file  ?Tobacco Use  ? Smoking status: Never  ? Smokeless tobacco: Never  ?Vaping Use  ? Vaping Use: Never used  ?Substance and Sexual Activity  ? Alcohol use: Yes  ?  Alcohol/week: 6.0 standard drinks  ?  Types: 3 Glasses of wine, 3 Cans of beer per week  ?  Comment: ocassional  ? Drug use: No  ? Sexual activity: Yes  ?Other Topics Concern  ? Not on file  ?Social History Narrative  ? Not on file  ? ?Social Determinants of Health  ? ?Financial Resource Strain: Not on file  ?Food Insecurity: Not on file  ?Transportation Needs: Not on file  ?Physical Activity: Not on file  ?Stress: Not on file  ?Social Connections: Not on file  ?  ? ?  Family History: ?The patient's family history includes Cancer in his father; Dementia in his father and mother; Heart attack in his maternal grandmother. ? ?ROS:   ?Please see the history of present illness.    ?All other systems reviewed and are negative. ? ?EKGs/Labs/Other Studies Reviewed:   ? ?The following studies were reviewed today: ?Echo 08/11/2021: ? 1. Left ventricular ejection fraction, by estimation, is 65 to 70%. The  ?left ventricle has hyperdynamic function. The left ventricle has no  ?regional wall motion abnormalities. There is moderate left ventricular  ?hypertrophy. Left  ventricular diastolic  ?parameters are consistent with Grade II diastolic dysfunction  ?(pseudonormalization).  ? 2. Right ventricular systolic function is normal. The right ventricular  ?size is normal. Tricuspid regurgitation signal is inadequate for assessing  ?PA pressure.  ? 3. The mitral valve is normal in structure. Trivial mitral valve  ?regurgitation. No evidence of mitral stenosis.  ? 4. The aortic valve is tricuspid. Aortic valve regurgitation is not  ?visualized. No aortic stenosis is present.  ? 5. The inferior vena cava is normal in size with <50% respiratory  ?variability, suggesting right atrial pressure of 8 mmHg.  ? ?Cardiac Cath 08/11/2021: ?  Ramus lesion is 20% stenosed. ?  Prox LAD lesion is 20% stenosed. ?  Prox RCA lesion is 15% stenosed. ?  There is hyperdynamic left ventricular systolic function. ?  The left ventricular ejection fraction is greater than 65% by visual estimate. ?  ?Mild coronary calcification with mild nonobstructive CAD. ?  ?Hyperdynamic LV function with at least moderate left ventricular hypertrophy.  EF estimate greater than 65%.  LVEDP 14 mmHg. ?  ?RECOMMENDATION: ?Catheterization demonstrates mild coronary calcification without  significant coronary obstructive disease.  Consider potential for microvascular angina in the etiology of his chest discomfort.  ECG changes may be contributed by left ventricular hypertrophy/repolarization.  Consider possible low-dose beta-blocker or ARB therapy for improvement in endothelial function versus amlodipine. ? ?EKG:  EKG is ordered today.  The ekg ordered today demonstrates sinus bradycardia 49 bpm, LVH with repolarization abnormality, unchanged from previous tracing ? ?Recent Labs: ?08/11/2021: ALT 19; TSH 3.384 ?08/12/2021: BUN 9; Creatinine, Ser 0.86; Hemoglobin 13.5; Platelets 164; Potassium 3.8; Sodium 140  ?Recent Lipid Panel ?   ?Component Value Date/Time  ? CHOL 123 08/11/2021 0149  ? TRIG 98 08/11/2021 0149  ? HDL 45  08/11/2021 0149  ? CHOLHDL 2.7 08/11/2021 0149  ? VLDL 20 08/11/2021 0149  ? LDLCALC 58 08/11/2021 0149  ? LDLCALC 91 08/18/2020 1110  ? ? ? ?Risk Assessment/Calculations:   ?  ? ?    ? ?Physical Exam:   ? ?VS:  BP 126/80   Pulse (!) 52   Ht 5\' 11"  (1.803 m)   Wt 181 lb 9.6 oz (82.4 kg)   SpO2 97%   BMI 25.33 kg/m?    ? ?Wt Readings from Last 3 Encounters:  ?11/15/21 181 lb 9.6 oz (82.4 kg)  ?08/26/21 184 lb 9.6 oz (83.7 kg)  ?08/12/21 183 lb 3.2 oz (83.1 kg)  ?  ? ?GEN:  Well nourished, well developed in no acute distress ?HEENT: Normal ?NECK: No JVD; No carotid bruits ?LYMPHATICS: No lymphadenopathy ?CARDIAC: RRR, no murmurs, rubs, gallops ?RESPIRATORY:  Clear to auscultation without rales, wheezing or rhonchi  ?ABDOMEN: Soft, non-tender, non-distended ?MUSCULOSKELETAL:  No edema; No deformity  ?SKIN: Warm and dry ?NEUROLOGIC:  Alert and oriented x 3 ?PSYCHIATRIC:  Normal affect  ? ?ASSESSMENT:   ? ?1. Hypertrophic nonobstructive cardiomyopathy (  HCC)   ? ?PLAN:   ? ?In order of problems listed above: ? ?The patient appears clinically stable with no exercise-induced symptoms at this point.  He is physically active in a regular exercise regimen.  He does have a constant pressure in his chest that he feels continuously through his waking hours, but I think this is related to stress.  His EKG remains abnormal with LVH and diffuse ST/T wave abnormality.  His echocardiogram showed moderate concentric left ventricular hypertrophy without asymmetric hypertrophy or outflow tract obstruction.  I have recommended a cardiac MRI to evaluate for the presence of hypertrophic cardiomyopathy.  I have recommended an exercise treadmill study to evaluate hemodynamic response to exercise.  I think this will help risk stratify him.  He has no family history of sudden cardiac death outside of a grandfather who died in his 31s after heart attack.  He has no personal history of syncope. ? ?   ?Medication Adjustments/Labs and Tests  Ordered: ?Current medicines are reviewed at length with the patient today.  Concerns regarding medicines are outlined above.  ?Orders Placed This Encounter  ?Procedures  ? MR CARDIAC MORPHOLOGY W WO CONTRAST  ? Cardiac S

## 2021-11-15 NOTE — Patient Instructions (Signed)
Medication Instructions:  ?CHANGE Rosuvastatin to every other day (20mg ) ?*If you need a refill on your cardiac medications before your next appointment, please call your pharmacy* ? ? ?Lab Work: ?NONE ?If you have labs (blood work) drawn today and your tests are completely normal, you will receive your results only by: ?MyChart Message (if you have MyChart) OR ?A paper copy in the mail ?If you have any lab test that is abnormal or we need to change your treatment, we will call you to review the results. ? ? ?Testing/Procedures: ?Cardiac MRI ?Your physician has requested that you have a cardiac MRI. Cardiac MRI uses a computer to create images of your heart as its beating, producing both still and moving pictures of your heart and major blood vessels. For further information please visit [email or web address] Please follow the instruction sheet given to you today for more information. ? ?Exercise Tolerance Test ?Your physician has requested that you have an exercise tolerance test. For further information please visit [email or web address]. Please also follow instruction sheet, as given.  ? ? ? ?Follow-Up: ?At [facility], you and your health needs are our priority.  As part of our continuing mission to provide you with exceptional heart care, we have created designated Provider Care Teams.  These Care Teams include your primary Cardiologist (physician) and Advanced Practice Providers (APPs -  Physician Assistants and Nurse Practitioners) who all work together to provide you with the care you need, when you need it. ? ?We recommend signing up for the patient portal called "MyChart".  Sign up information is provided on this After Visit Summary.  MyChart is used to connect with patients for Virtual Visits (Telemedicine).  Patients are able to view lab/test results, encounter notes, upcoming appointments, etc.  Non-urgent messages can be sent to your provider as well.   ?To learn more about what you can do with MyChart,  go to [email or web address].   ? ?Your next appointment:   ?6 month(s) ? ?The format for your next appointment:   ?In Person ? ?Provider:   ?Sherren Mocha, MD ? ? ?Other Instructions ?Instructions for Stress test sent via MyChart ? ?Important Information About Sugar ? ? ? ? ?  ?

## 2021-11-19 ENCOUNTER — Other Ambulatory Visit: Payer: Self-pay | Admitting: Internal Medicine

## 2021-11-19 MED ORDER — ESCITALOPRAM OXALATE 20 MG PO TABS: ORAL_TABLET | ORAL | 3 refills | Status: DC

## 2021-12-02 ENCOUNTER — Ambulatory Visit (INDEPENDENT_AMBULATORY_CARE_PROVIDER_SITE_OTHER): Payer: PRIVATE HEALTH INSURANCE

## 2021-12-02 DIAGNOSIS — I422 Other hypertrophic cardiomyopathy: Secondary | ICD-10-CM

## 2021-12-02 LAB — EXERCISE TOLERANCE TEST
Angina Index: 0
Duke Treadmill Score: 1
Estimated workload: 13.4
Exercise duration (min): 11 min
Exercise duration (sec): 0 s
MPHR: 162 {beats}/min
Peak HR: 153 {beats}/min
Percent HR: 94 %
RPE: 15
Rest HR: 62 {beats}/min
ST Depression (mm): 2 mm

## 2021-12-29 ENCOUNTER — Telehealth (HOSPITAL_COMMUNITY): Payer: Self-pay | Admitting: Emergency Medicine

## 2021-12-29 NOTE — Telephone Encounter (Signed)
Reaching out to patient to offer assistance regarding upcoming cardiac imaging study; pt verbalizes understanding of appt date/time, parking situation and where to check in, pre-test NPO status and medications ordered, and verified current allergies; name and call back number provided for further questions should they arise Rockwell Alexandria RN Navigator Cardiac Imaging [facility] [phone] office [phone] cell  Right shoulder replacement otherwise no implants Denies claustro Denies iv issues Arrival [phone]

## 2021-12-30 ENCOUNTER — Ambulatory Visit (HOSPITAL_COMMUNITY)
Admission: RE | Admit: 2021-12-30 | Discharge: 2021-12-30 | Disposition: A | Discharge status: Home / Self Care | Payer: PRIVATE HEALTH INSURANCE | Source: Ambulatory Visit | Attending: Cardiovascular Disease | Admitting: Cardiovascular Disease

## 2021-12-30 DIAGNOSIS — I422 Other hypertrophic cardiomyopathy: Secondary | ICD-10-CM | POA: Insufficient documentation

## 2021-12-30 MED ORDER — GADOBUTROL 1 MMOL/ML IV SOLN
9.0000 mL | Freq: Once | INTRAVENOUS | Status: AC | PRN
  Administered 2021-12-30: 9 mL via INTRAVENOUS

## 2022-01-24 ENCOUNTER — Telehealth: Payer: Self-pay

## 2022-01-24 DIAGNOSIS — I77819 Aortic ectasia, unspecified site: Secondary | ICD-10-CM

## 2022-01-24 NOTE — Telephone Encounter (Signed)
-----   Message from Dave Bollman, MD sent at 01/22/2022  7:17 AM EDT ----- Spoke with the patient further about results. He is completely asymptomatic. Will plan follow-up imaging in one year with gated CTA of the chest to evaluate aortic size. I would like to see him back in one year for follow-up.  I would like to refer him to Dr Izora Ribas who specializes in HCM to discuss considerations around genetic testing, etc.

## 2022-01-24 NOTE — Telephone Encounter (Signed)
Orders placed for referral to [facility] and for CTA of chest/aorta to be done in 1 year. Recall placed for 1 year OV.

## 2022-02-27 ENCOUNTER — Encounter: Payer: Self-pay | Admitting: Internal Medicine

## 2022-02-27 NOTE — Progress Notes (Signed)
History of Present Illness:       This very nice 61 y.o.  MWM presents for 6 month follow up with labile elevated BP, HLD, Pre-Diabetes and Vitamin D Deficiency.        Patient is followed expectantly for elevated BP & BP has been controlled at home. Today's BP off meds  is at goal -  102/60.  Chest CT in 2021 showed Aortic Atherosclerosis.  Heart cath in Jan 2023 shown mild non-obstructive CAD & LVH . 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 Patient has had no complaints of any cardiac type chest pain, palpitations, dyspnea Vertell Limber /PND, dizziness, claudication, or dependent edema.      Hyperlipidemia is not controlled with diet & he was started on Rosuvastatin in 2021 . Patient denies myalgias or other med SE's. Last Lipids were at goal:  Lab Results  Component Value Date   CHOL 123 08/11/2021   HDL 45 08/11/2021   LDL  58 08/11/2021   TRIG 98 08/11/2021   CHOL / HDL 2.7 08/11/2021    Also, the patient is monitored expectantly for glucose intolerance and has had no symptoms of reactive hypoglycemia, diabetic polys, paresthesias or visual blurring.  Last A1c was Normal & at goal:  Lab Results  Component Value Date   HGBA1C 5.0 08/11/2021        Further, the patient also has history of Vitamin D Deficiency ("19" / Feb 2019)  and supplements vitamin D without any suspected side-effects. Last vitamin D was still low and he reports that he has increased his dose since then :  Lab Results  Component Value Date   VD25OH 37 08/18/2020       Current Outpatient Medications on File Prior to Visit  Medication Sig   escitalopram (LEXAPRO) 20 MG tablet Take 1 tablet Daily for Mood   rosuvastatin (CRESTOR) 20 MG tablet Take 1 tablet  every other day    No Known Allergies  PMHx:   Past Medical History:  Diagnosis Date   Arthritis    CAD in native artery non obstructive disease 09/01/2019   Hyperlipidemia    LVH (left  ventricular hypertrophy) 08/12/2021   Unstable angina (Alpha) 08/12/2021    Immunization History  Administered Date(s) Administered   PFIZER-SARS-COV-2 Vaccination 10/04/2019, 10/25/2019   PPD Test 02/19/2019, 08/18/2020, 08/26/2021   Tdap 11/16/2019    Past Surgical History:  Procedure Laterality Date   ANAL FISSURE REPAIR  2006   HERNIA REPAIR Right 2006   LEFT HEART CATH AND CORONARY ANGIOGRAPHY N/A 08/11/2021   Procedure: LEFT HEART CATH AND CORONARY ANGIOGRAPHY;  Surgeon: Troy Sine, MD;  Location: [facility];  Service: Cardiovascular;  Laterality: N/A;   TOTAL SHOULDER ARTHROPLASTY Right 08/01/2019   Procedure: TOTAL SHOULDER ARTHROPLASTY;  Surgeon: Tania Ade, MD;  Location: [facility];  Service: Orthopedics;  Laterality: Right;    FHx:    Reviewed / unchanged  SHx:    Reviewed / unchanged   Systems Review:  Constitutional: Denies fever, chills, wt changes, headaches, insomnia, fatigue, night sweats, change in appetite. Eyes: Denies redness, blurred vision, diplopia, discharge, itchy, watery eyes.  ENT: Denies discharge, congestion, post nasal drip, epistaxis, sore throat, earache, hearing loss, dental pain, tinnitus, vertigo, sinus pain, snoring.  CV: Denies chest pain, palpitations, irregular heartbeat, syncope, dyspnea, diaphoresis, orthopnea, PND, claudication or edema. Respiratory: denies cough, dyspnea, DOE, pleurisy, hoarseness, laryngitis, wheezing.  Gastrointestinal: Denies dysphagia, odynophagia, heartburn, reflux, water brash, abdominal pain or cramps, nausea, vomiting, bloating, diarrhea, constipation, hematemesis, melena, hematochezia  or hemorrhoids. Genitourinary: Denies dysuria, frequency, urgency, nocturia, hesitancy, discharge, hematuria or flank pain. Musculoskeletal: Denies arthralgias, myalgias, stiffness, jt. swelling, pain, limping or strain/sprain.  Skin: Denies pruritus, rash, hives, warts, acne, eczema or change in skin lesion(s). Neuro:  No weakness, tremor, incoordination, spasms, paresthesia or pain. Psychiatric: Denies confusion, memory loss or sensory loss. Endo: Denies change in weight, skin or hair change.  Heme/Lymph: No excessive bleeding, bruising or enlarged lymph nodes.  Physical Exam  BP 102/60   Pulse (!) 50   Temp 97.9 F (36.6 C)   Resp 16   Ht 5\' 11"  (1.803 m)   Wt 182 lb 3.2 oz (82.6 kg)   SpO2 97%   BMI 25.41 kg/m   Appears  well nourished, well groomed  and in no distress.  Eyes: PERRLA, EOMs, conjunctiva no swelling or erythema. Sinuses: No frontal/maxillary tenderness ENT/Mouth: EAC's clear, TM's nl w/o erythema, bulging. Nares clear w/o erythema, swelling, exudates. Oropharynx clear without erythema or exudates. Oral hygiene is good. Tongue normal, non obstructing. Hearing intact.  Neck: Supple. Thyroid not palpable. Car 2+/2+ without bruits, nodes or JVD. Chest: Respirations nl with BS clear & equal w/o rales, rhonchi, wheezing or stridor.  Cor: Heart sounds normal w/ regular rate and rhythm without sig. murmurs, gallops, clicks or rubs. Peripheral pulses normal and equal  without edema.  Abdomen: Soft & bowel sounds normal. Non-tender w/o guarding, rebound, hernias, masses or organomegaly.  Lymphatics: Unremarkable.  Musculoskeletal: Full ROM all peripheral extremities, joint stability, 5/5 strength and normal gait.  Skin: Warm, dry without exposed rashes, lesions or ecchymosis apparent.  Neuro: Cranial nerves intact, reflexes equal bilaterally. Sensory-motor testing grossly intact. Tendon reflexes grossly intact.  Pysch: Alert & oriented x 3.  Insight and judgement nl & appropriate. No ideations.  Assessment and Plan:   1. Elevated BP without diagnosis of hypertension  - CBC with Differential/Platelet - COMPLETE METABOLIC PANEL WITH GFR - Magnesium - TSH  2. Hyperlipidemia, mixed  - Lipid panel - TSH  3. Abnormal glucose  - Hemoglobin A1c - Insulin, random  4. Vitamin D  deficiency  - VITAMIN D 25 Hydroxy  5. Aortic atherosclerosis (HCC)  - Lipid panel  6. CAD in native artery non obstructive disease - Lipid panel  7. Medication management  - CBC with Differential/Platelet - COMPLETE METABOLIC PANEL WITH GFR - Magnesium - Lipid panel - TSH - Hemoglobin A1c - Insulin, random - VITAMIN D 25 Hydroxy         Discussed  regular exercise, BP monitoring, weight control to achieve/maintain BMI less than 25 and discussed med and SE's. Recommended labs to assess and monitor clinical status with further disposition pending results of labs.  I discussed the assessment and treatment plan with the patient. The patient was provided an opportunity to ask questions and all were answered. The patient agreed with the plan and demonstrated an understanding of the instructions.  I provided over 30 minutes of exam, counseling, chart review and  complex critical decision making.   ,  MD  To prevent harm (release of this note would result in harm to the life or physical safety of the patient or another).

## 2022-02-27 NOTE — Patient Instructions (Signed)

## 2022-02-28 ENCOUNTER — Encounter: Payer: Self-pay | Admitting: Internal Medicine

## 2022-02-28 ENCOUNTER — Ambulatory Visit (INDEPENDENT_AMBULATORY_CARE_PROVIDER_SITE_OTHER): Payer: No Typology Code available for payment source | Admitting: Internal Medicine

## 2022-02-28 VITALS — BP 102/60 | HR 50 | Temp 97.9°F | Resp 16 | Ht 71.0 in | Wt 182.2 lb

## 2022-02-28 DIAGNOSIS — R030 Elevated blood-pressure reading, without diagnosis of hypertension: Secondary | ICD-10-CM | POA: Diagnosis not present

## 2022-02-28 DIAGNOSIS — Z79899 Other long term (current) drug therapy: Secondary | ICD-10-CM

## 2022-02-28 DIAGNOSIS — I700 Atherosclerosis of aorta: Secondary | ICD-10-CM

## 2022-02-28 DIAGNOSIS — R7309 Other abnormal glucose: Secondary | ICD-10-CM | POA: Diagnosis not present

## 2022-02-28 DIAGNOSIS — I2510 Atherosclerotic heart disease of native coronary artery without angina pectoris: Secondary | ICD-10-CM

## 2022-02-28 DIAGNOSIS — E559 Vitamin D deficiency, unspecified: Secondary | ICD-10-CM | POA: Diagnosis not present

## 2022-02-28 DIAGNOSIS — E782 Mixed hyperlipidemia: Secondary | ICD-10-CM

## 2022-03-01 LAB — CBC WITH DIFFERENTIAL/PLATELET
Absolute Monocytes: 238 cells/uL (ref 200–950)
Basophils Absolute: 29 cells/uL (ref 0–200)
Basophils Relative: 1 %
Eosinophils Absolute: 49 cells/uL (ref 15–500)
Eosinophils Relative: 1.7 %
HCT: 43.4 % (ref 38.5–50.0)
Hemoglobin: 15.4 g/dL (ref 13.2–17.1)
Lymphs Abs: 803 cells/uL — ABNORMAL LOW (ref 850–3900)
MCH: 32.6 pg (ref 27.0–33.0)
MCHC: 35.5 g/dL (ref 32.0–36.0)
MCV: 91.8 fL (ref 80.0–100.0)
MPV: 9.9 fL (ref 7.5–12.5)
Monocytes Relative: 8.2 %
Neutro Abs: 1781 cells/uL (ref 1500–7800)
Neutrophils Relative %: 61.4 %
Platelets: 190 10*3/uL (ref 140–400)
RBC: 4.73 10*6/uL (ref 4.20–5.80)
RDW: 12.4 % (ref 11.0–15.0)
Total Lymphocyte: 27.7 %
WBC: 2.9 10*3/uL — ABNORMAL LOW (ref 3.8–10.8)

## 2022-03-01 LAB — LIPID PANEL
Cholesterol: 165 mg/dL (ref <200)
HDL: 77 mg/dL (ref >=40)
LDL Cholesterol (Calc): 72 mg/dL (calc)
Non-HDL Cholesterol (Calc): 88 mg/dL (calc) (ref <130)
Total CHOL/HDL Ratio: 2.1 (calc) (ref <5.0)
Triglycerides: 79 mg/dL (ref <150)

## 2022-03-01 LAB — COMPLETE METABOLIC PANEL WITH GFR
AG Ratio: 2.1 (calc) (ref 1.0–2.5)
ALT: 14 U/L (ref 9–46)
AST: 15 U/L (ref 10–35)
Albumin: 4.5 g/dL (ref 3.6–5.1)
Alkaline phosphatase (APISO): 65 U/L (ref 35–144)
BUN: 12 mg/dL (ref 7–25)
CO2: 25 mmol/L (ref 20–32)
Calcium: 10 mg/dL (ref 8.6–10.3)
Chloride: 106 mmol/L (ref 98–110)
Creat: 0.79 mg/dL (ref 0.70–1.30)
Globulin: 2.1 g/dL (calc) (ref 1.9–3.7)
Glucose, Bld: 104 mg/dL — ABNORMAL HIGH (ref 65–99)
Potassium: 4.4 mmol/L (ref 3.5–5.3)
Sodium: 140 mmol/L (ref 135–146)
Total Bilirubin: 0.8 mg/dL (ref 0.2–1.2)
Total Protein: 6.6 g/dL (ref 6.1–8.1)
eGFR: 103 mL/min/{1.73_m2} (ref >=60)

## 2022-03-01 LAB — HEMOGLOBIN A1C
Hgb A1c MFr Bld: 4.9 % of total Hgb (ref <5.7)
Mean Plasma Glucose: 94 mg/dL
eAG (mmol/L): 5.2 mmol/L

## 2022-03-01 LAB — INSULIN, RANDOM: Insulin: 5.9 u[IU]/mL

## 2022-03-01 LAB — VITAMIN D 25 HYDROXY (VIT D DEFICIENCY, FRACTURES): Vit D, 25-Hydroxy: 34 ng/mL (ref 30–100)

## 2022-03-01 LAB — MAGNESIUM: Magnesium: 2.1 mg/dL (ref 1.5–2.5)

## 2022-03-01 LAB — TSH: TSH: 1.05 mIU/L (ref 0.40–4.50)

## 2022-03-01 NOTE — Progress Notes (Signed)
<><><><><><><><><><><><><><><><><><><><><><><><><><><><><><><><><> <><><><><><><><><><><><><><><><><><><><><><><><><><><><><><><><><> -   Test results slightly outside the reference range are not unusual. If there is anything important, I will review this with you,  otherwise it is considered normal test values.  If you have further questions,  please do not hesitate to contact me at the office or via My Chart.  <><><><><><><><><><><><><><><><><><><><><><><><><><><><><><><><><> <><><><><><><><><><><><><><><><><><><><><><><><><><><><><><><><><>  -  Total Chol = 165    &    LDL Chol = 72   -   Both    Excellent   - Very low risk for Heart Attack  / Stroke <><><><><><><><><><><><><><><><><><><><><><><><><><><><><><><><><>  -  A1c - Norma  -  No Diabetes - Great  ! <><><><><><><><><><><><><><><><><><><><><><><><><><><><><><><><><>  -  Vitamin D = 34 - Extremely & Dangerously LOW  !  !  !  - Vitamin D goal is between 70-100.   - Please take  Vitamin D 10,000 units  /day   - It is very important as a natural anti-inflammatory and helping the  immune system protect against viral infections, like the Covid-19    helping hair, skin, and nails, as well as reducing stroke and  heart attack risk.   - It helps your bones and helps with mood.  - It also decreases numerous cancer risks so please  take it as directed.   - Low Vit D is associated with a 200-300% higher risk for  CANCER   and 200-300% higher risk for HEART   ATTACK  &  STROKE.    - It is also associated with higher death rate at younger ages,   autoimmune diseases like Rheumatoid arthritis, Lupus,  Multiple Sclerosis.     - Also many other serious conditions, like depression, Alzheimer's  Dementia, infertility, muscle aches, fatigue, fibromyalgia   - just to name a  few. <><><><><><><><><><><><><><><><><><><><><><><><><><><><><><><><><> <><><><><><><><><><><><><><><><><><><><><><><><><><><><><><><><><>

## 2022-05-12 ENCOUNTER — Encounter: Payer: Self-pay | Admitting: Internal Medicine

## 2022-05-12 ENCOUNTER — Ambulatory Visit (INDEPENDENT_AMBULATORY_CARE_PROVIDER_SITE_OTHER): Payer: PRIVATE HEALTH INSURANCE

## 2022-05-12 ENCOUNTER — Other Ambulatory Visit: Payer: Self-pay | Admitting: Internal Medicine

## 2022-05-12 ENCOUNTER — Ambulatory Visit: Payer: PRIVATE HEALTH INSURANCE | Attending: Internal Medicine | Admitting: Internal Medicine

## 2022-05-12 VITALS — BP 100/58 | HR 60 | Ht 71.0 in | Wt 184.0 lb

## 2022-05-12 DIAGNOSIS — I422 Other hypertrophic cardiomyopathy: Secondary | ICD-10-CM | POA: Diagnosis not present

## 2022-05-12 DIAGNOSIS — I493 Ventricular premature depolarization: Secondary | ICD-10-CM

## 2022-05-12 NOTE — Patient Instructions (Signed)
Medication Instructions:  Your physician recommends that you continue on your current medications as directed. Please refer to the Current Medication list given to you today.  *If you need a refill on your cardiac medications before your next appointment, please call your pharmacy*   Lab Work: NONE If you have labs (blood work) drawn today and your tests are completely normal, you will receive your results only by: MyChart Message (if you have MyChart) OR A paper copy in the mail If you have any lab test that is abnormal or we need to change your treatment, we will call you to review the results.   Testing/Procedures: Your physician has requested that you wear a heart monitor.   Follow-Up: At [facility], you and your health needs are our priority.  As part of our continuing mission to provide you with exceptional heart care, we have created designated Provider Care Teams.  These Care Teams include your primary Cardiologist (physician) and Advanced Practice Providers (APPs -  Physician Assistants and Nurse Practitioners) who all work together to provide you with the care you need, when you need it.   Your next appointment:   1 year(s)  The format for your next appointment:   In Person  Provider:   Riley Lam, MD   Other Instructions Dave Rogers- Long Term Monitor Instructions  Your physician has requested you wear a ZIO patch monitor for 3 days.  This is a single patch monitor. [employer] supplies one patch monitor per enrollment. Additional stickers are not available. Please do not apply patch if you will be having a Nuclear Stress Test,  Echocardiogram, Cardiac CT, MRI, or Chest Xray during the period you would be wearing the  monitor. The patch cannot be worn during these tests. You cannot remove and re-apply the  ZIO XT patch monitor.  Your ZIO patch monitor will be mailed 3 day USPS to your address on file. It may take 3-5 days  to receive your monitor after  you have been enrolled.  Once you have received your monitor, please review the enclosed instructions. Your monitor  has already been registered assigning a specific monitor serial # to you.  Billing and Patient Assistance Program Information  We have supplied [employer] with any of your insurance information on file for billing purposes. [employer] offers a sliding scale Patient Assistance Program for patients that do not have  insurance, or whose insurance does not completely cover the cost of the ZIO monitor.  You must apply for the Patient Assistance Program to qualify for this discounted rate.  To apply, please call [employer] at [phone], select option 4, select option 2, ask to apply for  Patient Assistance Program. Meredeth Ide will ask your household income, and how many people  are in your household. They will quote your out-of-pocket cost based on that information.  [employer] will also be able to set up a 30-month, interest-free payment plan if needed.  Applying the monitor   Shave hair from upper left chest.  Hold abrader disc by orange tab. Rub abrader in 40 strokes over the upper left chest as  indicated in your monitor instructions.  Clean area with 4 enclosed alcohol pads. Let dry.  Apply patch as indicated in monitor instructions. Patch will be placed under collarbone on left  side of chest with arrow pointing upward.  Rub patch adhesive wings for 2 minutes. Remove white label marked "1". Remove the white  label marked "2". Rub patch adhesive wings for 2 additional  minutes.  While looking in a mirror, press and release button in center of patch. A small green light will  flash 3-4 times. This will be your only indicator that the monitor has been turned on.  Do not shower for the first 24 hours. You may shower after the first 24 hours.  Press the button if you feel a symptom. You will hear a small click. Record Date, Time and  Symptom in the Patient Logbook.  When you are ready to  remove the patch, follow instructions on the last 2 pages of Patient  Logbook. Stick patch monitor onto the last page of Patient Logbook.  Place Patient Logbook in the blue and white box. Use locking tab on box and tape box closed  securely. The blue and white box has prepaid postage on it. Please place it in the mailbox as  soon as possible. Your physician should have your test results approximately 7 days after the  monitor has been mailed back to [facility].  Call [facility] at [phone] if you have questions regarding  your ZIO XT patch monitor. Call them immediately if you see an orange light blinking on your  monitor.  If your monitor falls off in less than 4 days, contact our Monitor department at [phone].  If your monitor becomes loose or falls off after 4 days call [employer] at [phone] for  suggestions on securing your monitor   Important Information About [employer]

## 2022-05-12 NOTE — Progress Notes (Signed)
Cardiology Office Note:    Date:  05/12/2022   ID:  Dave Rogers, DOB 1960/11/25, MRN [id number]  PCP:  Dave Cowboy, MD   [facility] Providers Cardiologist:  Dave Fair, MD     Referring MD: Dave Bollman, MD   CC: Suggestive to come here.  Consulted for the evaluation of HCM at the behest of Dr. Excell Rogers  History of Present Illness:    Dave Rogers is a 61 y.o. male with a hx of HCM and CAD with LAD disease.  Patient feels well.   He is able to run a quarter mile and walk a mile Lifts weights 25 lbs at most.   Patient notes no SOB at rest and DOE except for with quick movements.  Notes only minor fatigue.  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  Notes no palpitations Notes rare CP and tightness. Notes no Dizziness. Notes no syncope. Notable family events include grandfather MI died < 50.  He has one daughter who is 26.   Past Medical History:  Diagnosis Date   Arthritis    CAD in native artery non obstructive disease 09/01/2019   Hyperlipidemia    LVH (left ventricular hypertrophy) 08/12/2021   Unstable angina (HCC) 08/12/2021    Past Surgical History:  Procedure Laterality Date   ANAL FISSURE REPAIR  2006   HERNIA REPAIR Right 2006   LEFT HEART CATH AND CORONARY ANGIOGRAPHY N/A 08/11/2021   Procedure: LEFT HEART CATH AND CORONARY ANGIOGRAPHY;  Surgeon: Dave Bihari, MD;  Location: [facility];  Service: Cardiovascular;  Laterality: N/A;   TOTAL SHOULDER ARTHROPLASTY Right 08/01/2019   Procedure: TOTAL SHOULDER ARTHROPLASTY;  Surgeon: Dave Broom, MD;  Location: [facility];  Service: Orthopedics;  Laterality: Right;    Current Medications: Current Meds  Medication Sig   Ascorbic Acid (VITAMIN C PO) Take by mouth. PRN   B-COMPLEX-C PO Take by mouth. PRN   rosuvastatin (CRESTOR) 20 MG tablet Take 1 tablet by mouth every other day   VITAMIN D PO Take by mouth. PRN     Allergies:   Patient has  no known allergies.   Social History   Socioeconomic History   Marital status: Married    Spouse name: Not on file   Number of children: 1   Years of education: Not on file   Highest education level: Not on file  Occupational History   Not on file  Tobacco Use   Smoking status: Never   Smokeless tobacco: Never  Vaping Use   Vaping Use: Never used  Substance and Sexual Activity   Alcohol use: Yes    Alcohol/week: 6.0 standard drinks of alcohol    Types: 3 Glasses of wine, 3 Cans of beer per week    Comment: ocassional   Drug use: No   Sexual activity: Yes  Other Topics Concern   Not on file  Social History Narrative   Not on file   Social Determinants of Health   Financial Resource Strain: Not on file  Food Insecurity: Not on file  Transportation Needs: Not on file  Physical Activity: Unknown (08/29/2017)   Exercise Vital Sign    Days of Exercise per Week: 3 days    Minutes of Exercise per Session: Not on file  Stress: Not on file  Social Connections: Not on file     Family History: The patient's family history includes Cancer in his father; Dementia in his father and  mother; Heart attack in his maternal grandmother.  ROS:   Please see the history of present illness.     All other systems reviewed and are negative.  EKGs/Labs/Other Studies Reviewed:    The following studies were reviewed today:  LEFT HEART CATH AND CORONARY ANGIOGRAPHY 08/11/2021  Narrative   Ramus lesion is 20% stenosed.   Prox LAD lesion is 20% stenosed.   Prox RCA lesion is 15% stenosed.   There is hyperdynamic left ventricular systolic function.   The left ventricular ejection fraction is greater than 65% by visual estimate.  Mild coronary calcification with mild nonobstructive CAD.  Hyperdynamic LV function with at least moderate left ventricular hypertrophy.  EF estimate greater than 65%.  LVEDP 14 mmHg.  RECOMMENDATION: Catheterization demonstrates mild coronary calcification  without  significant coronary obstructive disease.  Consider potential for microvascular angina in the etiology of his chest discomfort.  ECG changes may be contributed by left ventricular hypertrophy/repolarization.  Consider possible low-dose beta-blocker or ARB therapy for improvement in endothelial function versus amlodipine.   EXERCISE TOLERANCE TEST (ETT) 12/02/2021  Narrative   2.0 mm of down sloping ST depression (II, III, aVF, V5 and V6) was noted.  ETT with good exercise tolerance (11:00); no CP; normal BP response; 2-3 mm ST depression in the inferolateral leads felt to be nondiagnostic given left ventricular hypertrophy with repolarization abnormality at baseline; no exercise-induced ectopy.   ECHO COMPLETE WO IMAGING ENHANCING AGENT 08/11/2021  Narrative ECHOCARDIOGRAM REPORT    Patient Name:   Dave Rogers Date of Exam: 08/11/2021 Medical Rec #:  [id number]      Height:       71.0 in Accession #:    [id number]     Weight:       181.2 lb Date of Birth:  [birth year]      BSA:          2.022 m Patient Age:    60 years       BP:           103/64 mmHg Patient Gender: M              HR:           66 bpm. Exam Location:  Inpatient  Procedure: 2D Echo, Cardiac Doppler and Color Doppler  Indications:    NSTEMI  History:        Patient has no prior history of Echocardiogram examinations.  Sonographer:    Vanetta Shawl Referring Phys: 58 Dave Rogers  IMPRESSIONS   1. Left ventricular ejection fraction, by estimation, is 65 to 70%. The left ventricle has hyperdynamic function. The left ventricle has no regional wall motion abnormalities. There is moderate left ventricular hypertrophy. Left ventricular diastolic parameters are consistent with Grade II diastolic dysfunction (pseudonormalization). 2. Right ventricular systolic function is normal. The right ventricular size is normal. Tricuspid regurgitation signal is inadequate for assessing PA pressure. 3. The mitral  valve is normal in structure. Trivial mitral valve regurgitation. No evidence of mitral stenosis. 4. The aortic valve is tricuspid. Aortic valve regurgitation is not visualized. No aortic stenosis is present. 5. The inferior vena cava is normal in size with <50% respiratory variability, suggesting right atrial pressure of 8 mmHg.  FINDINGS Left Ventricle: Left ventricular ejection fraction, by estimation, is 65 to 70%. The left ventricle has hyperdynamic function. The left ventricle has no regional wall motion abnormalities. The left ventricular internal cavity size was normal in size. There is moderate left ventricular  hypertrophy. Left ventricular diastolic parameters are consistent with Grade II diastolic dysfunction (pseudonormalization).  Right Ventricle: The right ventricular size is normal. No increase in right ventricular wall thickness. Right ventricular systolic function is normal. Tricuspid regurgitation signal is inadequate for assessing PA pressure.  Left Atrium: Left atrial size was normal in size.  Right Atrium: Right atrial size was normal in size.  Pericardium: There is no evidence of pericardial effusion.  Mitral Valve: The mitral valve is normal in structure. Trivial mitral valve regurgitation. No evidence of mitral valve stenosis.  Tricuspid Valve: The tricuspid valve is normal in structure. Tricuspid valve regurgitation is not demonstrated.  Aortic Valve: The aortic valve is tricuspid. Aortic valve regurgitation is not visualized. No aortic stenosis is present. Aortic valve mean gradient measures 4.0 mmHg. Aortic valve peak gradient measures 7.3 mmHg. Aortic valve area, by VTI measures 2.09 cm.  Pulmonic Valve: The pulmonic valve was normal in structure. Pulmonic valve regurgitation is not visualized.  Aorta: The aortic root is normal in size and structure.  Venous: The inferior vena cava is normal in size with less than 50% respiratory variability, suggesting right  atrial pressure of 8 mmHg.  IAS/Shunts: No atrial level shunt detected by color flow Doppler.   LEFT VENTRICLE PLAX 2D LVIDd:         4.40 cm   Diastology LVIDs:         2.80 cm   LV e' medial:    6.31 cm/s LV PW:         1.30 cm   LV E/e' medial:  11.9 LV IVS:        1.50 cm   LV e' lateral:   11.40 cm/s LVOT diam:     2.00 cm   LV E/e' lateral: 6.6 LV SV:         64 LV SV Index:   32 LVOT Area:     3.14 cm   RIGHT VENTRICLE             IVC RV Basal diam:  3.80 cm     IVC diam: 2.20 cm RV Mid diam:    2.20 cm RV S prime:     14.80 cm/s  LEFT ATRIUM             Index        RIGHT ATRIUM           Index LA diam:        4.10 cm 2.03 cm/m   RA Area:     17.70 cm LA Vol (A2C):   54.5 ml 26.95 ml/m  RA Volume:   45.90 ml  22.70 ml/m LA Vol (A4C):   59.6 ml 29.48 ml/m LA Biplane Vol: 57.8 ml 28.59 ml/m AORTIC VALVE                    PULMONIC VALVE AV Area (Vmax):    2.30 cm     PV Vmax:       1.28 m/s AV Area (Vmean):   2.17 cm     PV Peak grad:  6.6 mmHg AV Area (VTI):     2.09 cm AV Vmax:           135.00 cm/s AV Vmean:          94.900 cm/s AV VTI:            0.306 m AV Peak Grad:      7.3 mmHg AV Mean Grad:  4.0 mmHg LVOT Vmax:         99.00 cm/s LVOT Vmean:        65.600 cm/s LVOT VTI:          0.204 m LVOT/AV VTI ratio: 0.67  AORTA Ao Root diam: 3.60 cm  MITRAL VALVE MV Area (PHT): 4.24 cm    SHUNTS MV Decel Time: 179 msec    Systemic VTI:  0.20 m MV E velocity: 75.20 cm/s  Systemic Diam: 2.00 cm MV A velocity: 59.70 cm/s MV E/A ratio:  1.26   MR CARDIAC MORPHOLOGY W WO CONTRAST 12/30/2021  Narrative CLINICAL DATA:  Clinical question of hypertrophic cardiomyopathy Study assumes HCT of 38 and BSA of 2.03 m2  EXAM: CARDIAC MRI  TECHNIQUE: The patient was scanned on a 1.5 Tesla GE magnet. A dedicated cardiac coil was used. Functional imaging was done using Fiesta sequences. 2,3, and 4 chamber views were done to assess for RWMA's. Modified  Simpson's rule using a short axis stack was used to calculate an ejection fraction on a dedicated work Conservation officer, nature. The patient received 9 cc of Gadavist. After 10 minutes inversion recovery sequences were used to assess for infiltration and scar tissue.  CONTRAST:  9 cc  of Gadavist  FINDINGS: 1. Normal left ventricular size, with LVEDD 44 mm, and LVEDVi 56 mL/m2.  Severe asymmetric septal hypertrophy, with intraventricular septal thickness of 15 mm, posterior wall thickness of 10 mm, and normal myocardial mass index of 61 g/m2.  Normal left ventricular systolic function (LVEF =81%). There are no regional wall motion abnormalities.  Left ventricular parametric mapping notable for normal T2.  ECV signal is mildly elevated in mid septum 37%.  There is no late gadolinium enhancement in the left ventricular myocardium. Six standard deviation assessment used.  2. Normal right ventricular size with RVEDVI 65 mL/m2.  Normal right ventricular thickness.  Normal right ventricular systolic function (RVEF =85%). There are no regional wall motion abnormalities or aneurysms.  3.  Normal left and right atrial size.  4.  Normal size of the aortic root and pulmonary artery.  Mild ascending aortic aneurysm 42 mm.  5. Valve assessment:  Aortic Valve: Tri-leaflet aortic valve. Regurgitant fraction 13%. Mild aortic regurgitation.  Pulmonic Valve: Qualitatively, there is no significant regurgitation.  Tricuspid Valve: Qualitatively, there is no significant regurgitation.  Mitral Valve: There is no significant regurgitation.  6.  Normal pericardium.  No pericardial effusion.  7. Grossly, no extracardiac findings. Recommended dedicated study if concerned for non-cardiac pathology.  IMPRESSION: Evidence of mild ascending aortic dilation 42 mm. Consider secondary imaging modality (echocardiogram, CTA Aorta Protocol, MRA Aorta Protocol) in one year if clinically  indicated.  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 No LGE. Maximal thickness 15 mm. No LVOT Obstruction on this study.  Rudean Haskell MD   Electronically Signed By: Rudean Haskell M.D. On: 12/30/2021 21:32     Recent Labs: 02/28/2022: ALT 14; BUN 12; Creat 0.79; Hemoglobin 15.4; Magnesium 2.1; Platelets 190; Potassium 4.4; Sodium 140; TSH 1.05  Recent Lipid Panel    Component Value Date/Time   CHOL 165 02/28/2022 1036   TRIG 79 02/28/2022 1036   HDL 77 02/28/2022 1036   CHOLHDL 2.1 02/28/2022 1036   VLDL 20 08/11/2021 0149   LDLCALC 72 02/28/2022 1036        Physical Exam:    VS:  BP (!) 100/58   Pulse 60   Ht 5\' 11"  (1.803 m)  Wt 184 lb (83.5 kg)   SpO2 98%   BMI 25.66 kg/m     Wt Readings from Last 3 Encounters:  05/12/22 184 lb (83.5 kg)  02/28/22 182 lb 3.2 oz (82.6 kg)  11/15/21 181 lb 9.6 oz (82.4 kg)    GEN:  Well nourished, well developed in no acute distress HEENT: Normal NECK: No JVD; No carotid bruits LYMPHATICS: No lymphadenopathy CARDIAC: RRR, soft systolic murmur with standing, no rubs, gallops RESPIRATORY:  Clear to auscultation without rales, wheezing or rhonchi  ABDOMEN: Soft, non-tender, non-distended MUSCULOSKELETAL:  No edema; No deformity  SKIN: Warm and dry NEUROLOGIC:  Alert and oriented x 3 PSYCHIATRIC:  Normal affect   ASSESSMENT:    1. Hypertrophic nonobstructive cardiomyopathy (HCC)    PLAN:    Hypertrophic Cardiomyopathy Minimal CAD - septal Variant with no resting LVOT gradient. - without MR, Apical Aneurysm, or reduced EF  - NYHA II with rare chest pain  - Family history likely negative, Discussed family screening  (We had discussed that outside of gene therapy, the role this would have for his daugther, he is amenable to discuss echo with daugther and primary pediatrician)  - Exercise testing normal for normal rhythm and no ectopy - CMR  notable for no  scar - he would not tolerate AV nodal therapy (heart rate and BP)  - will get ziopatch for 3 days for NSVT assessment  We have discussed clinical trials for his symptoms; I will reach out if [place] or CMI therapy because available; though given he is on NYHA class II he may not elect to participate  Exercise  Limitations:  Limitation in burst activity (we used heavy weight lifting, and burpees as examples), he has weight restriction from a prior shoulder surgery.   One year with me unless NSVT on heart monitor or worsening sx and interested in clinical trials  Time Spent Directly with Patient:   I have spent a total of 50 minutes with the patient reviewing notes, imaging, EKGs, labs and examining the patient as well as establishing an assessment and plan that was discussed personally with the patient.  > 50% of time was spent in direct patient care and family and reviewing imaging with patient.   Medication Adjustments/Labs and Tests Ordered: Current medicines are reviewed at length with the patient today.  Concerns regarding medicines are outlined above.  No orders of the defined types were placed in this encounter.  No orders of the defined types were placed in this encounter.   Patient Instructions  Medication Instructions:  Your physician recommends that you continue on your current medications as directed. Please refer to the Current Medication list given to you today.  *If you need a refill on your cardiac medications before your next appointment, please call your pharmacy*   Lab Work: NONE If you have labs (blood work) drawn today and your tests are completely normal, you will receive your results only by: MyChart Message (if you have MyChart) OR A paper copy in the mail If you have any lab test that is abnormal or we need to change your treatment, we will call you to review the results.   Testing/Procedures: Your physician has requested that you wear a heart  monitor.   Follow-Up: At [facility], you and your health needs are our priority.  As part of our continuing mission to provide you with exceptional heart care, we have created designated Provider Care Teams.  These Care Teams include your primary  Cardiologist (physician) and Advanced Practice Providers (APPs -  Physician Assistants and Nurse Practitioners) who all work together to provide you with the care you need, when you need it.   Your next appointment:   1 year(s)  The format for your next appointment:   In Person  Provider:   Riley Lam, MD   Other Instructions Christena Deem- Long Term Monitor Instructions  Your physician has requested you wear a ZIO patch monitor for 3 days.  This is a single patch monitor. [employer] supplies one patch monitor per enrollment. Additional stickers are not available. Please do not apply patch if you will be having a Nuclear Stress Test,  Echocardiogram, Cardiac CT, MRI, or Chest Xray during the period you would be wearing the  monitor. The patch cannot be worn during these tests. You cannot remove and re-apply the  ZIO XT patch monitor.  Your ZIO patch monitor will be mailed 3 day USPS to your address on file. It may take 3-5 days  to receive your monitor after you have been enrolled.  Once you have received your monitor, please review the enclosed instructions. Your monitor  has already been registered assigning a specific monitor serial # to you.  Billing and Patient Assistance Program Information  We have supplied [employer] with any of your insurance information on file for billing purposes. [employer] offers a sliding scale Patient Assistance Program for patients that do not have  insurance, or whose insurance does not completely cover the cost of the ZIO monitor.  You must apply for the Patient Assistance Program to qualify for this discounted rate.  To apply, please call [employer] at [phone], select option 4, select option 2,  ask to apply for  Patient Assistance Program. Meredeth Ide will ask your household income, and how many people  are in your household. They will quote your out-of-pocket cost based on that information.  [employer] will also be able to set up a 37-month, interest-free payment plan if needed.  Applying the monitor   Shave hair from upper left chest.  Hold abrader disc by orange tab. Rub abrader in 40 strokes over the upper left chest as  indicated in your monitor instructions.  Clean area with 4 enclosed alcohol pads. Let dry.  Apply patch as indicated in monitor instructions. Patch will be placed under collarbone on left  side of chest with arrow pointing upward.  Rub patch adhesive wings for 2 minutes. Remove white label marked "1". Remove the white  label marked "2". Rub patch adhesive wings for 2 additional minutes.  While looking in a mirror, press and release button in center of patch. A small green light will  flash 3-4 times. This will be your only indicator that the monitor has been turned on.  Do not shower for the first 24 hours. You may shower after the first 24 hours.  Press the button if you feel a symptom. You will hear a small click. Record Date, Time and  Symptom in the Patient Logbook.  When you are ready to remove the patch, follow instructions on the last 2 pages of Patient  Logbook. Stick patch monitor onto the last page of Patient Logbook.  Place Patient Logbook in the blue and white box. Use locking tab on box and tape box closed  securely. The blue and white box has prepaid postage on it. Please place it in the mailbox as  soon as possible. Your physician should have your test results approximately 7 days after the  monitor has been mailed back to [facility].  Call [facility] Customer Care at [phone] if you have questions regarding  your ZIO XT patch monitor. Call them immediately if you see an orange light blinking on your  monitor.  If your monitor falls off  in less than 4 days, contact our Monitor department at [phone].  If your monitor becomes loose or falls off after 4 days call [employer] at [phone] for  suggestions on securing your monitor   Important Information About Sugar         Signed, Christell Constant, MD  05/12/2022 12:43 PM    [facility]

## 2022-05-12 NOTE — Progress Notes (Unsigned)
Enrolled for [employer] to mail a ZIO XT long term holter monitor to the patients address on file.  

## 2022-05-18 DIAGNOSIS — I422 Other hypertrophic cardiomyopathy: Secondary | ICD-10-CM | POA: Diagnosis not present

## 2022-05-18 DIAGNOSIS — I493 Ventricular premature depolarization: Secondary | ICD-10-CM | POA: Diagnosis not present

## 2022-05-31 ENCOUNTER — Ambulatory Visit: Payer: Self-pay | Admitting: Nurse Practitioner

## 2022-06-07 ENCOUNTER — Encounter: Payer: Self-pay | Admitting: Nurse Practitioner

## 2022-06-07 ENCOUNTER — Ambulatory Visit (INDEPENDENT_AMBULATORY_CARE_PROVIDER_SITE_OTHER): Payer: Self-pay | Admitting: Nurse Practitioner

## 2022-06-07 VITALS — BP 140/80 | HR 61 | Temp 97.6°F | Resp 17 | Ht 71.0 in | Wt 186.0 lb

## 2022-06-07 DIAGNOSIS — I200 Unstable angina: Secondary | ICD-10-CM

## 2022-06-07 DIAGNOSIS — E782 Mixed hyperlipidemia: Secondary | ICD-10-CM

## 2022-06-07 DIAGNOSIS — I700 Atherosclerosis of aorta: Secondary | ICD-10-CM

## 2022-06-07 DIAGNOSIS — I214 Non-ST elevation (NSTEMI) myocardial infarction: Secondary | ICD-10-CM | POA: Diagnosis not present

## 2022-06-07 DIAGNOSIS — Z20822 Contact with and (suspected) exposure to covid-19: Secondary | ICD-10-CM

## 2022-06-07 DIAGNOSIS — D709 Neutropenia, unspecified: Secondary | ICD-10-CM

## 2022-06-07 DIAGNOSIS — R051 Acute cough: Secondary | ICD-10-CM

## 2022-06-07 DIAGNOSIS — E559 Vitamin D deficiency, unspecified: Secondary | ICD-10-CM

## 2022-06-07 DIAGNOSIS — I2510 Atherosclerotic heart disease of native coronary artery without angina pectoris: Secondary | ICD-10-CM

## 2022-06-07 DIAGNOSIS — R0981 Nasal congestion: Secondary | ICD-10-CM

## 2022-06-07 DIAGNOSIS — Z79899 Other long term (current) drug therapy: Secondary | ICD-10-CM

## 2022-06-07 DIAGNOSIS — I517 Cardiomegaly: Secondary | ICD-10-CM

## 2022-06-07 MED ORDER — AZITHROMYCIN 500 MG PO TABS: ORAL_TABLET | ORAL | 0 refills | Status: DC
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
## 2022-06-07 NOTE — Patient Instructions (Signed)

## 2022-06-08 LAB — COMPLETE METABOLIC PANEL WITH GFR
AG Ratio: 1.9 (calc) (ref 1.0–2.5)
ALT: 16 U/L (ref 9–46)
AST: 13 U/L (ref 10–35)
Albumin: 4.4 g/dL (ref 3.6–5.1)
Alkaline phosphatase (APISO): 82 U/L (ref 35–144)
BUN: 14 mg/dL (ref 7–25)
CO2: 28 mmol/L (ref 20–32)
Calcium: 10 mg/dL (ref 8.6–10.3)
Chloride: 105 mmol/L (ref 98–110)
Creat: 0.71 mg/dL (ref 0.70–1.30)
Globulin: 2.3 g/dL (calc) (ref 1.9–3.7)
Glucose, Bld: 110 mg/dL — ABNORMAL HIGH (ref 65–99)
Potassium: 4.5 mmol/L (ref 3.5–5.3)
Sodium: 139 mmol/L (ref 135–146)
Total Bilirubin: 0.7 mg/dL (ref 0.2–1.2)
Total Protein: 6.7 g/dL (ref 6.1–8.1)
eGFR: 106 mL/min/{1.73_m2} (ref >=60)

## 2022-06-08 LAB — CBC WITH DIFFERENTIAL/PLATELET
Absolute Monocytes: 388 cells/uL (ref 200–950)
Basophils Absolute: 29 cells/uL (ref 0–200)
Basophils Relative: 0.5 %
Eosinophils Absolute: 80 cells/uL (ref 15–500)
Eosinophils Relative: 1.4 %
HCT: 43.3 % (ref 38.5–50.0)
Hemoglobin: 15.1 g/dL (ref 13.2–17.1)
Lymphs Abs: 775 cells/uL — ABNORMAL LOW (ref 850–3900)
MCH: 32.2 pg (ref 27.0–33.0)
MCHC: 34.9 g/dL (ref 32.0–36.0)
MCV: 92.3 fL (ref 80.0–100.0)
MPV: 10.5 fL (ref 7.5–12.5)
Monocytes Relative: 6.8 %
Neutro Abs: 4429 cells/uL (ref 1500–7800)
Neutrophils Relative %: 77.7 %
Platelets: 183 10*3/uL (ref 140–400)
RBC: 4.69 10*6/uL (ref 4.20–5.80)
RDW: 12.5 % (ref 11.0–15.0)
Total Lymphocyte: 13.6 %
WBC: 5.7 10*3/uL (ref 3.8–10.8)

## 2022-06-08 LAB — LIPID PANEL
Cholesterol: 159 mg/dL (ref <200)
HDL: 65 mg/dL (ref >=40)
LDL Cholesterol (Calc): 61 mg/dL (calc)
Non-HDL Cholesterol (Calc): 94 mg/dL (calc) (ref <130)
Total CHOL/HDL Ratio: 2.4 (calc) (ref <5.0)
Triglycerides: 282 mg/dL — ABNORMAL HIGH (ref <150)

## 2022-08-31 ENCOUNTER — Encounter: Payer: Self-pay | Admitting: Internal Medicine

## 2022-08-31 NOTE — Patient Instructions (Signed)

## 2022-08-31 NOTE — Progress Notes (Addendum)
Annual  Screening/Preventative Visit  & Comprehensive Evaluation & Examination   Future Appointments  Date Time Provider Department  09/01/2022 10:00 AM Unk Pinto, MD [facility]  09/08/2023 10:00 AM Unk Pinto, MD [facility]            This very nice 62 y.o. MWM presents for a Screening /Preventative Visit & comprehensive evaluation and management of multiple medical co-morbidities.  Patient has been followed for elevated  BP, HLD, Prediabetes and Vitamin D Deficiency.  Aortic Atherosclerosis  & Coronary artery calcifications were noted on Chest CT scan on 07/30/2019.         Patient  is followed expectantly for elevated BP.  Today's BP is at goal - 120/80 .  Heart cath by Dr Sallyanne Kuster in Jan 2023  showed only mild coronary calcification without  significant coronary obstructive disease.  Echocardiogram showed EF 65-70%.  EKG changes were attributed to LVH.  ETT in May 2023  was Negative & Normal.  Cardiac MRI in June 2023 showed ASH . Patient denies any cardiac symptoms as palpitations, shortness of breath, dizziness or ankle swelling.       Patient's hyperlipidemia is controlled with diet and medications. Patient denies myalgias or other medication SE's. Last lipids were at goal except elevated Trig's :  Lab Results  Component Value Date   CHOL 159 06/07/2022   HDL 65 06/07/2022   LDLCALC 61 06/07/2022   TRIG 282 (H) 06/07/2022   CHOLHDL 2.4 06/07/2022         Patient has also been monitored for abnormal elevated glucose.   Patient denies reactive hypoglycemic symptoms, visual blurring, diabetic polys or paresthesias. Recent A1c was Normal & at goal :   Lab Results  Component Value Date   HGBA1C 4.9 02/28/2022         Finally, patient has history of Vitamin D Deficiency ("19" /2019) and last vitamin D was still low :   Lab Results  Component Value Date   VD25OH 34 02/28/2022       Current Outpatient Medications  Medication Instructions   Ascorbic  Acid (VITAMIN C PO) Oral, PRN   B-COMPLEX-C PO Oral, PRN   rosuvastatin (CRESTOR) 20 MG tablet Take 1 tablet  every other day   VITAMIN D PO Oral, PRN     No Known Allergies   Past Medical History:  Diagnosis Date   Arthritis    CAD in native artery non obstructive disease 09/01/2019   Hyperlipidemia    LVH (left ventricular hypertrophy) 08/12/2021   Unstable angina ([place]) 08/12/2021     Health Maintenance  Topic Date Due   Hepatitis C Screening  Never done   Zoster Vaccines- Shingrix (1 of 2) Never done   COVID-19 Vaccine (3 - Pfizer risk series) 11/22/2019   INFLUENZA VACCINE  Never done   COLONOSCOPY ( 02/16/2023   TETANUS/TDAP  11/15/2029   HIV Screening  Completed   HPV VACCINES  Aged Out     Immunization History  Administered Date(s) Administered   PFIZER SARS-COV-2 Vacc 10/04/2019, 10/25/2019   PPD Test 02/19/2019, 08/18/2020   Tdap 11/16/2019    Last Colon - 2014 in [place] - & was advised 10 yr f/u due 2024   Past Surgical History:  Procedure Laterality Date   ANAL FISSURE REPAIR  2006   HERNIA REPAIR Right 2006   LEFT HEART CATH AND CORONARY ANGIOGRAPHY N/A 08/11/2021   Procedure: LEFT HEART CATH AND CORONARY ANGIOGRAPHY;  Surgeon: Troy Sine, MD;  Location: [place] [facility];  Service: Cardiovascular;  Laterality: N/A;   TOTAL SHOULDER ARTHROPLASTY Right 08/01/2019   Procedure: TOTAL SHOULDER ARTHROPLASTY;  Surgeon: Tania Ade, MD;  Location: [facility];  Service: Orthopedics;  Laterality: Right;     Family History  Problem Relation Age of Onset   Dementia Mother    Cancer Father    Dementia Father    Heart attack Maternal Grandmother      Social History   Socioeconomic History   Marital status: Married      Spouse name: Anderson Malta   Number of children: 1 daughter  Occupational History   Works in [employer] in [employer]   Tobacco Use   Smoking status: Never   Smokeless tobacco: Never  Vaping Use   Vaping Use: Never  used  Substance Use Topics   Alcohol use: Yes    Alcohol/week: 6.0 standard drinks    Types: 3 Glasses of wine, 3 Cans of beer per week    Comment: ocassional   Drug use: No      ROS Constitutional: Denies fever, chills, weight loss/gain, headaches, insomnia,  night sweats or change in appetite. Does c/o fatigue. Eyes: Denies redness, blurred vision, diplopia, discharge, itchy or watery eyes.  ENT: Denies discharge, congestion, post nasal drip, epistaxis, sore throat, earache, hearing loss, dental pain, Tinnitus, Vertigo, Sinus pain or snoring.  Cardio: Denies chest pain, palpitations, irregular heartbeat, syncope, dyspnea, diaphoresis, orthopnea, PND, claudication or edema Respiratory: denies cough, dyspnea, DOE, pleurisy, hoarseness, laryngitis or wheezing.  Gastrointestinal: Denies dysphagia, heartburn, reflux, water brash, pain, cramps, nausea, vomiting, bloating, diarrhea, constipation, hematemesis, melena, hematochezia, jaundice or hemorrhoids Genitourinary: Denies dysuria, frequency, urgency, nocturia, hesitancy, discharge, hematuria or flank pain Musculoskeletal: Denies arthralgia, myalgia, stiffness, Jt. Swelling, pain, limp or strain/sprain. Denies Falls. Skin: Denies puritis, rash, hives, warts, acne, eczema or change in skin lesion Neuro: No weakness, tremor, incoordination, spasms, paresthesia or pain Psychiatric: Denies confusion, memory loss or sensory loss. Denies Depression. Endocrine: Denies change in weight, skin, hair change, nocturia, and paresthesia, diabetic polys, visual blurring or hyper / hypo glycemic episodes.  Heme/Lymph: No excessive bleeding, bruising or enlarged lymph nodes.   Physical Exam  BP 120/80   Pulse (!) 51   Temp 97.9 F (36.6 C)   Resp 16   Ht 5' 11"$  (1.803 m)   Wt 191 lb 9.6 oz (86.9 kg)   SpO2 99%   BMI 26.72 kg/m   General Appearance: Well nourished and well groomed and in no apparent distress.  Eyes: PERRLA, EOMs, conjunctiva no  swelling or erythema, normal fundi and vessels. Sinuses: No frontal/maxillary tenderness ENT/Mouth: EACs patent / TMs  nl. Nares clear without erythema, swelling, mucoid exudates. Oral hygiene is good. No erythema, swelling, or exudate. Tongue normal, non-obstructing. Tonsils not swollen or erythematous. Hearing normal.  Neck: Supple, thyroid not palpable. No bruits, nodes or JVD. Respiratory: Respiratory effort normal.  BS equal and clear bilateral without rales, rhonci, wheezing or stridor. Cardio: Heart sounds are normal with regular rate and rhythm and no murmurs, rubs or gallops. Peripheral pulses are normal and equal bilaterally without edema. No aortic or femoral bruits. Chest: symmetric with normal excursions and percussion.  Abdomen: Soft, with Nl bowel sounds. Nontender, no guarding, rebound, hernias, masses, or organomegaly.  Lymphatics: Non tender without lymphadenopathy.  Musculoskeletal: Full ROM all peripheral extremities, joint stability, 5/5 strength, and normal gait. Skin: Warm and dry without rashes, lesions, cyanosis, clubbing or  ecchymosis.  Neuro: Cranial nerves intact,  reflexes equal bilaterally. Normal muscle tone, no cerebellar symptoms. Sensation intact.  Pysch: Alert and oriented X 3 with normal affect, insight and judgment appropriate.   Assessment and Plan  1. Annual Preventative/Screening Exam    2. Elevated BP without diagnosis of hypertension  - EKG 12-Lead - [place], RETROPERITNL ABD,  LTD - Urinalysis, Routine w reflex microscopic - Microalbumin / creatinine urine ratio - CBC with Differential/Platelet - COMPLETE METABOLIC PANEL WITH GFR - Magnesium - TSH  3. Hyperlipidemia, mixed  - EKG 12-Lead - [place], RETROPERITNL ABD,  LTD - Lipid panel  4. Abnormal glucose  - EKG 12-Lead - [place], RETROPERITNL ABD,  LTD - Hemoglobin A1c - Insulin, random  5. Vitamin D deficiency  - EKG 12-Lead - VITAMIN D 25 Hydroxy (Vit-D Deficiency, Fractures)  6. Aortic  atherosclerosis ([place]) by Chest CTscan  07/30/2019  - EKG 12-Lead - [place], RETROPERITNL ABD,  LTD - Lipid panel  7. Coronary artery calcification  - EKG 12-Lead - Lipid panel  8. BPH with obstruction/lower urinary tract symptoms   9. 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 Screening-pulmonary TB  - TB Skin Test  11. Screening for colorectal cancer  - POC Hemoccult Bld/Stl (3-Cd Home Screen); Future  12. Prostate cancer screening   13. Screening for heart disease  - EKG 12-Lead  14. Screening for AAA (aortic abdominal aneurysm)  - EKG 12-Lead - [place], RETROPERITNL ABD,  LTD  15. Fatigue, unspecified type  - Iron, Total/Total Iron Binding Cap - Vitamin B12 - CBC with Differential/Platelet - TSH  16. Medication management  - Urinalysis, Routine w reflex microscopic - Microalbumin / creatinine urine ratio - Vitamin B12 - CBC with Differential/Platelet - COMPLETE METABOLIC PANEL WITH GFR - Magnesium - Lipid panel - TSH - Hemoglobin A1c          Patient was counseled in prudent diet, weight control to achieve/maintain BMI less than 25, BP monitoring, regular exercise and medications as discussed.  Discussed med effects and SE's. Routine screening labs and tests as requested with regular follow-up as recommended. Over 40 minutes of exam, counseling, chart review and high complex critical decision making was performed   Kirtland Bouchard, MD

## 2022-08-31 NOTE — Progress Notes (Incomplete Revision)
Annual  Screening/Preventative Visit  & Comprehensive Evaluation & Examination   Future Appointments  Date Time Provider Department  09/01/2022 10:00 AM Unk Pinto, MD [facility]  09/08/2023 10:00 AM Unk Pinto, MD [facility]            This very nice 62 y.o. MWM presents for a Screening /Preventative Visit & comprehensive evaluation and management of multiple medical co-morbidities.  Patient has been followed for elevated  BP, HLD, Prediabetes and Vitamin D Deficiency.  Aortic Atherosclerosis  & Coronary artery calcifications were noted on Chest CT scan on 07/30/2019.         Patient  is followed expectantly for elevated BP.  Today's BP is at goal - 120/80 .  Heart cath by Dr Sallyanne Kuster in Jan 2023  showed only mild coronary calcification without  significant coronary obstructive disease.  Echocardiogram showed EF 65-70%.  EKG changes were attributed to LVH.  ETT in May 2023  was Negative & Normal.  Cardiac MRI in June 2023 showed ASH . Patient denies any cardiac symptoms as palpitations, shortness of breath, dizziness or ankle swelling.       Patient's hyperlipidemia is controlled with diet and medications. Patient denies myalgias or other medication SE's. Last lipids were at goal except elevated Trig's :  Lab Results  Component Value Date   CHOL 159 06/07/2022   HDL 65 06/07/2022   LDLCALC 61 06/07/2022   TRIG 282 (H) 06/07/2022   CHOLHDL 2.4 06/07/2022         Patient has also been monitored for abnormal elevated glucose.   Patient denies reactive hypoglycemic symptoms, visual blurring, diabetic polys or paresthesias. Recent A1c was Normal & at goal :   Lab Results  Component Value Date   HGBA1C 4.9 02/28/2022         Finally, patient has history of Vitamin D Deficiency ("19" /2019) and last vitamin D was still low :   Lab Results  Component Value Date   VD25OH 34 02/28/2022       Current Outpatient Medications  Medication Instructions   Ascorbic  Acid (VITAMIN C PO) Oral, PRN   B-COMPLEX-C PO Oral, PRN   rosuvastatin (CRESTOR) 20 MG tablet Take 1 tablet  every other day   VITAMIN D PO Oral, PRN     No Known Allergies   Past Medical History:  Diagnosis Date   Arthritis    CAD in native artery non obstructive disease 09/01/2019   Hyperlipidemia    LVH (left ventricular hypertrophy) 08/12/2021   Unstable angina ([place]) 08/12/2021     Health Maintenance  Topic Date Due   Hepatitis C Screening  Never done   Zoster Vaccines- Shingrix (1 of 2) Never done   COVID-19 Vaccine (3 - Pfizer risk series) 11/22/2019   INFLUENZA VACCINE  Never done   COLONOSCOPY ( 02/16/2023   TETANUS/TDAP  11/15/2029   HIV Screening  Completed   HPV VACCINES  Aged Out     Immunization History  Administered Date(s) Administered   PFIZER SARS-COV-2 Vacc 10/04/2019, 10/25/2019   PPD Test 02/19/2019, 08/18/2020   Tdap 11/16/2019    Last Colon - 2014 in [place] - & was advised 10 yr f/u due 2024   Past Surgical History:  Procedure Laterality Date   ANAL FISSURE REPAIR  2006   HERNIA REPAIR Right 2006   LEFT HEART CATH AND CORONARY ANGIOGRAPHY N/A 08/11/2021   Procedure: LEFT HEART CATH AND CORONARY ANGIOGRAPHY;  Surgeon: Troy Sine, MD;  Location: [place] [facility];  Service: Cardiovascular;  Laterality: N/A;   TOTAL SHOULDER ARTHROPLASTY Right 08/01/2019   Procedure: TOTAL SHOULDER ARTHROPLASTY;  Surgeon: Tania Ade, MD;  Location: [facility];  Service: Orthopedics;  Laterality: Right;     Family History  Problem Relation Age of Onset   Dementia Mother    Cancer Father    Dementia Father    Heart attack Maternal Grandmother      Social History   Socioeconomic History   Marital status: Married      Spouse name: Anderson Malta   Number of children: 1 daughter  Occupational History   Works in [employer] in [employer]   Tobacco Use   Smoking status: Never   Smokeless tobacco: Never  Vaping Use   Vaping Use: Never  used  Substance Use Topics   Alcohol use: Yes    Alcohol/week: 6.0 standard drinks    Types: 3 Glasses of wine, 3 Cans of beer per week    Comment: ocassional   Drug use: No      ROS Constitutional: Denies fever, chills, weight loss/gain, headaches, insomnia,  night sweats or change in appetite. Does c/o fatigue. Eyes: Denies redness, blurred vision, diplopia, discharge, itchy or watery eyes.  ENT: Denies discharge, congestion, post nasal drip, epistaxis, sore throat, earache, hearing loss, dental pain, Tinnitus, Vertigo, Sinus pain or snoring.  Cardio: Denies chest pain, palpitations, irregular heartbeat, syncope, dyspnea, diaphoresis, orthopnea, PND, claudication or edema Respiratory: denies cough, dyspnea, DOE, pleurisy, hoarseness, laryngitis or wheezing.  Gastrointestinal: Denies dysphagia, heartburn, reflux, water brash, pain, cramps, nausea, vomiting, bloating, diarrhea, constipation, hematemesis, melena, hematochezia, jaundice or hemorrhoids Genitourinary: Denies dysuria, frequency, urgency, nocturia, hesitancy, discharge, hematuria or flank pain Musculoskeletal: Denies arthralgia, myalgia, stiffness, Jt. Swelling, pain, limp or strain/sprain. Denies Falls. Skin: Denies puritis, rash, hives, warts, acne, eczema or change in skin lesion Neuro: No weakness, tremor, incoordination, spasms, paresthesia or pain Psychiatric: Denies confusion, memory loss or sensory loss. Denies Depression. Endocrine: Denies change in weight, skin, hair change, nocturia, and paresthesia, diabetic polys, visual blurring or hyper / hypo glycemic episodes.  Heme/Lymph: No excessive bleeding, bruising or enlarged lymph nodes.   Physical Exam  There were no vitals taken for this visit.  General Appearance: Well nourished and well groomed and in no apparent distress.  Eyes: PERRLA, EOMs, conjunctiva no swelling or erythema, normal fundi and vessels. Sinuses: No frontal/maxillary tenderness ENT/Mouth:  EACs patent / TMs  nl. Nares clear without erythema, swelling, mucoid exudates. Oral hygiene is good. No erythema, swelling, or exudate. Tongue normal, non-obstructing. Tonsils not swollen or erythematous. Hearing normal.  Neck: Supple, thyroid not palpable. No bruits, nodes or JVD. Respiratory: Respiratory effort normal.  BS equal and clear bilateral without rales, rhonci, wheezing or stridor. Cardio: Heart sounds are normal with regular rate and rhythm and no murmurs, rubs or gallops. Peripheral pulses are normal and equal bilaterally without edema. No aortic or femoral bruits. Chest: symmetric with normal excursions and percussion.  Abdomen: Soft, with Nl bowel sounds. Nontender, no guarding, rebound, hernias, masses, or organomegaly.  Lymphatics: Non tender without lymphadenopathy.  Musculoskeletal: Full ROM all peripheral extremities, joint stability, 5/5 strength, and normal gait. Skin: Warm and dry without rashes, lesions, cyanosis, clubbing or  ecchymosis.  Neuro: Cranial nerves intact, reflexes equal bilaterally. Normal muscle tone, no cerebellar symptoms. Sensation intact.  Pysch: Alert and oriented X 3 with normal affect, insight and judgment appropriate.   Assessment and Plan  1. Annual Preventative/Screening Exam  2. Elevated BP without diagnosis of hypertension  - EKG 12-Lead - [place], RETROPERITNL ABD,  LTD - Urinalysis, Routine w reflex microscopic - Microalbumin / creatinine urine ratio - CBC with Differential/Platelet - COMPLETE METABOLIC PANEL WITH GFR - Magnesium - TSH  3. Hyperlipidemia, mixed  - EKG 12-Lead - [place], RETROPERITNL ABD,  LTD - Lipid panel  4. Abnormal glucose  - EKG 12-Lead - [place], RETROPERITNL ABD,  LTD - Hemoglobin A1c - Insulin, random  5. Vitamin D deficiency  - EKG 12-Lead - VITAMIN D 25 Hydrox  6. Aortic atherosclerosis ([place]) by Chest CTscan  07/30/2019  - EKG 12-Lead - [place], RETROPERITNL ABD,  LTD - Lipid panel  7. Coronary artery  calcification  - EKG 12-Lead - Lipid panel  8. BPH with obstruction/lower urinary tract symptoms   9. 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 Screening-pulmonary TB  - TB Skin Test  11. Screening for colorectal cancer  - POC Hemoccult Bld/Stl (  12. Prostate cancer screening   13. Screening for heart disease  - EKG 12-Lead  14. Screening for AAA (aortic abdominal aneurysm)  - EKG 12-Lead - [place], RETROPERITNL ABD,  LTD  15. Fatigue, unspecified type  - Iron, Total/Total Iron Binding Cap - Vitamin B12 - CBC with Differential/Platelet - TSH  16. Medication management  - Urinalysis, Routine w reflex microscopic - Microalbumin / creatinine urine ratio           Patient was counseled in prudent diet, weight control to achieve/maintain BMI less than 25, BP monitoring, regular exercise and medications as discussed.  Discussed med effects and SE's. Routine screening labs and tests as requested with regular follow-up as recommended. Over 40 minutes of exam, counseling, chart review and high complex critical decision making was performed   Kirtland Bouchard, MD

## 2022-09-01 ENCOUNTER — Encounter: Payer: Self-pay | Admitting: Internal Medicine

## 2022-09-01 ENCOUNTER — Ambulatory Visit (INDEPENDENT_AMBULATORY_CARE_PROVIDER_SITE_OTHER): Payer: Self-pay | Admitting: Internal Medicine

## 2022-09-01 VITALS — BP 120/80 | HR 51 | Temp 97.9°F | Resp 16 | Ht 71.0 in | Wt 191.6 lb

## 2022-09-01 DIAGNOSIS — Z130 Encounter for screening for diseases of the blood and blood-forming organs and certain disorders involving the immune mechanism: Secondary | ICD-10-CM | POA: Diagnosis not present

## 2022-09-01 DIAGNOSIS — Z111 Encounter for screening for respiratory tuberculosis: Secondary | ICD-10-CM

## 2022-09-01 DIAGNOSIS — Z13220 Encounter for screening for lipoid disorders: Secondary | ICD-10-CM | POA: Diagnosis not present

## 2022-09-01 DIAGNOSIS — Z136 Encounter for screening for cardiovascular disorders: Secondary | ICD-10-CM | POA: Diagnosis not present

## 2022-09-01 DIAGNOSIS — R030 Elevated blood-pressure reading, without diagnosis of hypertension: Secondary | ICD-10-CM

## 2022-09-01 DIAGNOSIS — Z1389 Encounter for screening for other disorder: Secondary | ICD-10-CM

## 2022-09-01 DIAGNOSIS — Z79899 Other long term (current) drug therapy: Secondary | ICD-10-CM | POA: Diagnosis not present

## 2022-09-01 DIAGNOSIS — N401 Enlarged prostate with lower urinary tract symptoms: Secondary | ICD-10-CM

## 2022-09-01 DIAGNOSIS — Z0000 Encounter for general adult medical examination without abnormal findings: Secondary | ICD-10-CM

## 2022-09-01 DIAGNOSIS — Z0001 Encounter for general adult medical examination with abnormal findings: Secondary | ICD-10-CM

## 2022-09-01 DIAGNOSIS — E782 Mixed hyperlipidemia: Secondary | ICD-10-CM

## 2022-09-01 DIAGNOSIS — E559 Vitamin D deficiency, unspecified: Secondary | ICD-10-CM | POA: Diagnosis not present

## 2022-09-01 DIAGNOSIS — I700 Atherosclerosis of aorta: Secondary | ICD-10-CM | POA: Diagnosis not present

## 2022-09-01 DIAGNOSIS — Z131 Encounter for screening for diabetes mellitus: Secondary | ICD-10-CM | POA: Diagnosis not present

## 2022-09-01 DIAGNOSIS — Z1211 Encounter for screening for malignant neoplasm of colon: Secondary | ICD-10-CM

## 2022-09-01 DIAGNOSIS — Z125 Encounter for screening for malignant neoplasm of prostate: Secondary | ICD-10-CM

## 2022-09-01 DIAGNOSIS — R7309 Other abnormal glucose: Secondary | ICD-10-CM

## 2022-09-01 DIAGNOSIS — I2510 Atherosclerotic heart disease of native coronary artery without angina pectoris: Secondary | ICD-10-CM

## 2022-09-01 DIAGNOSIS — R5383 Other fatigue: Secondary | ICD-10-CM

## 2022-09-03 LAB — VITAMIN B12: Vitamin B-12: 420 pg/mL (ref 200–1100)

## 2022-09-03 LAB — COMPLETE METABOLIC PANEL WITH GFR
AG Ratio: 1.7 (calc) (ref 1.0–2.5)
ALT: 18 U/L (ref 9–46)
AST: 16 U/L (ref 10–35)
Albumin: 4.5 g/dL (ref 3.6–5.1)
Alkaline phosphatase (APISO): 76 U/L (ref 35–144)
BUN: 13 mg/dL (ref 7–25)
CO2: 26 mmol/L (ref 20–32)
Calcium: 11 mg/dL — ABNORMAL HIGH (ref 8.6–10.3)
Chloride: 105 mmol/L (ref 98–110)
Creat: 0.75 mg/dL (ref 0.70–1.30)
Globulin: 2.6 g/dL (calc) (ref 1.9–3.7)
Glucose, Bld: 99 mg/dL (ref 65–99)
Potassium: 4.6 mmol/L (ref 3.5–5.3)
Sodium: 139 mmol/L (ref 135–146)
Total Bilirubin: 0.6 mg/dL (ref 0.2–1.2)
Total Protein: 7.1 g/dL (ref 6.1–8.1)
eGFR: 104 mL/min/{1.73_m2} (ref >=60)

## 2022-09-03 LAB — URINALYSIS, ROUTINE W REFLEX MICROSCOPIC
Bilirubin Urine: NEGATIVE
Glucose, UA: NEGATIVE
Hgb urine dipstick: NEGATIVE
Ketones, ur: NEGATIVE
Leukocytes,Ua: NEGATIVE
Nitrite: NEGATIVE
Protein, ur: NEGATIVE
Specific Gravity, Urine: 1.004 (ref 1.001–1.035)
pH: 7.5 (ref 5.0–8.0)

## 2022-09-03 LAB — CBC WITH DIFFERENTIAL/PLATELET
Absolute Monocytes: 180 cells/uL — ABNORMAL LOW (ref 200–950)
Basophils Absolute: 22 cells/uL (ref 0–200)
Basophils Relative: 0.6 %
Eosinophils Absolute: 40 cells/uL (ref 15–500)
Eosinophils Relative: 1.1 %
HCT: 46.1 % (ref 38.5–50.0)
Hemoglobin: 16 g/dL (ref 13.2–17.1)
Lymphs Abs: 828 cells/uL — ABNORMAL LOW (ref 850–3900)
MCH: 31.7 pg (ref 27.0–33.0)
MCHC: 34.7 g/dL (ref 32.0–36.0)
MCV: 91.3 fL (ref 80.0–100.0)
MPV: 9.6 fL (ref 7.5–12.5)
Monocytes Relative: 5 %
Neutro Abs: 2531 cells/uL (ref 1500–7800)
Neutrophils Relative %: 70.3 %
Platelets: 188 10*3/uL (ref 140–400)
RBC: 5.05 10*6/uL (ref 4.20–5.80)
RDW: 12.9 % (ref 11.0–15.0)
Total Lymphocyte: 23 %
WBC: 3.6 10*3/uL — ABNORMAL LOW (ref 3.8–10.8)

## 2022-09-03 LAB — LIPID PANEL
Cholesterol: 193 mg/dL (ref <200)
HDL: 82 mg/dL (ref >=40)
LDL Cholesterol (Calc): 91 mg/dL (calc)
Non-HDL Cholesterol (Calc): 111 mg/dL (calc) (ref <130)
Total CHOL/HDL Ratio: 2.4 (calc) (ref <5.0)
Triglycerides: 103 mg/dL (ref <150)

## 2022-09-03 LAB — MICROALBUMIN / CREATININE URINE RATIO
Creatinine, Urine: 13 mg/dL — ABNORMAL LOW (ref 20–320)
Microalb, Ur: <0.2 mg/dL

## 2022-09-03 LAB — IRON, TOTAL/TOTAL IRON BINDING CAP
%SAT: 36 % (calc) (ref 20–48)
Iron: 130 ug/dL (ref 50–180)
TIBC: 362 mcg/dL (calc) (ref 250–425)

## 2022-09-03 LAB — HEMOGLOBIN A1C
Hgb A1c MFr Bld: 5.1 % of total Hgb (ref <5.7)
Mean Plasma Glucose: 100 mg/dL
eAG (mmol/L): 5.5 mmol/L

## 2022-09-03 LAB — MAGNESIUM: Magnesium: 2.3 mg/dL (ref 1.5–2.5)

## 2022-09-03 LAB — TSH: TSH: 1.16 mIU/L (ref 0.40–4.50)

## 2022-09-03 LAB — VITAMIN D 25 HYDROXY (VIT D DEFICIENCY, FRACTURES): Vit D, 25-Hydroxy: 64 ng/mL (ref 30–100)

## 2022-09-03 LAB — INSULIN, RANDOM: Insulin: 8.6 u[IU]/mL

## 2022-09-03 NOTE — Progress Notes (Signed)
<><><><><><><><><><><><><><><><><><><><><><><><><><><><><><><><><> <><><><><><><><><><><><><><><><><><><><><><><><><><><><><><><><><> -   Test results slightly outside the reference range are not unusual. If there is anything important, I will review this with you,  otherwise it is considered normal test values.  If you have further questions,  please do not hesitate to contact me at the office or via My Chart.  <><><><><><><><><><><><><><><><><><><><><><><><><><><><><><><><><> <><><><><><><><><><><><><><><><><><><><><><><><><><><><><><><><><>  -  Vitamin B12 =  420  is  Very Low  (Ideal or Goal Vit B12 is between 450 - 1,100)   Low Vit B12 may be associated with Anemia , Fatigue,   Peripheral Neuropathy, Dementia, "Brain Fog", & Depression    - Recommend take a      sub-lingual form         of Vitamin B12 tablet    1,000 to 5,000 mcg tab that you         dissolve under your tongue /Daily   - Can get [employer] - best price at [employer] or on [employer]  <><><><><><><><><><><><><><><><><><><><><><><><><><><><><><><><><>  -  Total Chol = 193   & LDL Chol = 91 -- Both OK  <><><><><><><><><><><><><><><><><><><><><><><><><><><><><><><><><>  -   A1c - Normal - No Diabetes  - Great !  <><><><><><><><><><><><><><><><><><><><><><><><><><><><><><><><><>  -  Vitamin D = 64  - Much  better   - Vitamin D goal is between 70-100.   - It is very important as a natural anti-inflammatory and helping the                                   immune system protect against viral infections, like the Covid-19    helping hair, skin, and nails, as well as reducing stroke and heart attack risk.   - It helps your bones and helps with mood.  - It also decreases numerous cancer risks   - Low Vit D is associated with a 200-300% higher risk for CANCER   and 200-300% higher risk for [place].    - It is also associated with higher death rate at younger ages,   autoimmune diseases like  Rheumatoid arthritis, Lupus,  Multiple Sclerosis.     - Also many other serious conditions, like depression, Alzheimer's  Dementia, muscle aches, fatigue, fibromyalgia   <><><><><><><><><><><><><><><><><><><><><><><><><><><><><><><><><>  -   All Else - CBC - Kidneys - Electrolytes - Liver - Magnesium & Thyroid                                                                                                                - all  Normal / OK <><><><><><><><><><><><><><><><><><><><><><><><><><><><><><><><><>

## 2022-09-04 ENCOUNTER — Encounter: Payer: Self-pay | Admitting: Internal Medicine

## 2022-11-16 ENCOUNTER — Other Ambulatory Visit: Payer: Self-pay | Admitting: Cardiovascular Disease

## 2022-12-06 ENCOUNTER — Ambulatory Visit: Payer: Self-pay | Admitting: Nurse Practitioner

## 2022-12-19 ENCOUNTER — Encounter: Payer: Self-pay | Admitting: Nurse Practitioner

## 2022-12-19 ENCOUNTER — Ambulatory Visit: Payer: BC Managed Care – PPO | Admitting: Nurse Practitioner

## 2022-12-19 VITALS — BP 124/78 | HR 67 | Temp 97.9°F | Resp 16 | Ht 71.0 in | Wt 192.8 lb

## 2022-12-19 DIAGNOSIS — Z79899 Other long term (current) drug therapy: Secondary | ICD-10-CM | POA: Diagnosis not present

## 2022-12-19 DIAGNOSIS — I200 Unstable angina: Secondary | ICD-10-CM

## 2022-12-19 DIAGNOSIS — D709 Neutropenia, unspecified: Secondary | ICD-10-CM | POA: Diagnosis not present

## 2022-12-19 DIAGNOSIS — I517 Cardiomegaly: Secondary | ICD-10-CM | POA: Diagnosis not present

## 2022-12-19 DIAGNOSIS — E782 Mixed hyperlipidemia: Secondary | ICD-10-CM

## 2022-12-19 DIAGNOSIS — I2510 Atherosclerotic heart disease of native coronary artery without angina pectoris: Secondary | ICD-10-CM | POA: Diagnosis not present

## 2022-12-19 DIAGNOSIS — E559 Vitamin D deficiency, unspecified: Secondary | ICD-10-CM | POA: Diagnosis not present

## 2022-12-19 DIAGNOSIS — I214 Non-ST elevation (NSTEMI) myocardial infarction: Secondary | ICD-10-CM
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
## 2022-12-19 NOTE — Patient Instructions (Signed)

## 2022-12-20 LAB — CBC WITH DIFFERENTIAL/PLATELET
Absolute Monocytes: 214 cells/uL (ref 200–950)
Basophils Absolute: 21 cells/uL (ref 0–200)
Basophils Relative: 0.6 %
Eosinophils Absolute: 60 cells/uL (ref 15–500)
Eosinophils Relative: 1.7 %
HCT: 40.8 % (ref 38.5–50.0)
Hemoglobin: 14 g/dL (ref 13.2–17.1)
Lymphs Abs: 826 cells/uL — ABNORMAL LOW (ref 850–3900)
MCH: 31.7 pg (ref 27.0–33.0)
MCHC: 34.3 g/dL (ref 32.0–36.0)
MCV: 92.5 fL (ref 80.0–100.0)
MPV: 10 fL (ref 7.5–12.5)
Monocytes Relative: 6.1 %
Neutro Abs: 2380 cells/uL (ref 1500–7800)
Neutrophils Relative %: 68 %
Platelets: 184 10*3/uL (ref 140–400)
RBC: 4.41 10*6/uL (ref 4.20–5.80)
RDW: 12.6 % (ref 11.0–15.0)
Total Lymphocyte: 23.6 %
WBC: 3.5 10*3/uL — ABNORMAL LOW (ref 3.8–10.8)

## 2022-12-20 LAB — COMPLETE METABOLIC PANEL WITH GFR
AG Ratio: 2.1 (calc) (ref 1.0–2.5)
ALT: 14 U/L (ref 9–46)
AST: 12 U/L (ref 10–35)
Albumin: 4.2 g/dL (ref 3.6–5.1)
Alkaline phosphatase (APISO): 59 U/L (ref 35–144)
BUN: 13 mg/dL (ref 7–25)
CO2: 23 mmol/L (ref 20–32)
Calcium: 10 mg/dL (ref 8.6–10.3)
Chloride: 104 mmol/L (ref 98–110)
Creat: 0.75 mg/dL (ref 0.70–1.30)
Globulin: 2 g/dL (calc) (ref 1.9–3.7)
Glucose, Bld: 138 mg/dL — ABNORMAL HIGH (ref 65–99)
Potassium: 3.9 mmol/L (ref 3.5–5.3)
Sodium: 136 mmol/L (ref 135–146)
Total Bilirubin: 0.8 mg/dL (ref 0.2–1.2)
Total Protein: 6.2 g/dL (ref 6.1–8.1)
eGFR: 104 mL/min/{1.73_m2} (ref >=60)

## 2022-12-20 LAB — LIPID PANEL
Cholesterol: 139 mg/dL (ref <200)
HDL: 62 mg/dL (ref >=40)
LDL Cholesterol (Calc): 52 mg/dL (calc)
Non-HDL Cholesterol (Calc): 77 mg/dL (calc) (ref <130)
Total CHOL/HDL Ratio: 2.2 (calc) (ref <5.0)
Triglycerides: 173 mg/dL — ABNORMAL HIGH (ref <150)

## 2022-12-20 LAB — VITAMIN D 25 HYDROXY (VIT D DEFICIENCY, FRACTURES): Vit D, 25-Hydroxy: 73 ng/mL (ref 30–100)

## 2023-01-23 ENCOUNTER — Encounter: Payer: Self-pay | Admitting: Cardiovascular Disease

## 2023-01-23 ENCOUNTER — Ambulatory Visit: Payer: BC Managed Care – PPO | Attending: Cardiovascular Disease | Admitting: Cardiovascular Disease

## 2023-01-23 VITALS — BP 124/72 | HR 52 | Ht 71.0 in | Wt 192.6 lb

## 2023-01-23 DIAGNOSIS — R079 Chest pain, unspecified: Secondary | ICD-10-CM | POA: Diagnosis not present

## 2023-01-23 DIAGNOSIS — I422 Other hypertrophic cardiomyopathy: Secondary | ICD-10-CM | POA: Diagnosis not present

## 2023-01-23 NOTE — Patient Instructions (Signed)
Medication Instructions:  Your physician recommends that you continue on your current medications as directed. Please refer to the Current Medication list given to you today.  *If you need a refill on your cardiac medications before your next appointment, please call your pharmacy*   Lab Work: NONE If you have labs (blood work) drawn today and your tests are completely normal, you will receive your results only by: MyChart Message (if you have MyChart) OR A paper copy in the mail If you have any lab test that is abnormal or we need to change your treatment, we will call you to review the results.   Testing/Procedures: ECHO Your physician has requested that you have an echocardiogram. Echocardiography is a painless test that uses sound waves to create images of your heart. It provides your doctor with information about the size and shape of your heart and how well your heart's chambers and valves are working. This procedure takes approximately one hour. There are no restrictions for this procedure. Please do NOT wear cologne, perfume, aftershave, or lotions (deodorant is allowed). Please arrive 15 minutes prior to your appointment time.    Follow-Up: At [facility], you and your health needs are our priority.  As part of our continuing mission to provide you with exceptional heart care, we have created designated Provider Care Teams.  These Care Teams include your primary Cardiologist (physician) and Advanced Practice Providers (APPs -  Physician Assistants and Nurse Practitioners) who all work together to provide you with the care you need, when you need it.  We recommend signing up for the patient portal called "MyChart".  Sign up information is provided on this After Visit Summary.  MyChart is used to connect with patients for Virtual Visits (Telemedicine).  Patients are able to view lab/test results, encounter notes, upcoming appointments, etc.  Non-urgent messages can be sent  to your provider as well.   To learn more about what you can do with MyChart, go to [email or web address].    Your next appointment:   1 year(s)  Provider:   Michael Cooper, MD      

## 2023-01-23 NOTE — Progress Notes (Signed)
Cardiology Office Note:    Date:  01/23/2023   ID:  Dave Rogers, DOB 1961/05/06, MRN [id number]  PCP:  Lucky Cowboy, MD   [facility] Providers Cardiologist:  Tonny Bollman, MD     Referring MD: Lucky Cowboy, MD   Chief Complaint  Patient presents with   Chest Pain    History of Present Illness:    Dave Rogers is a 62 y.o. male presenting for follow-up of chest pain.  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  He does not have any high risk features and is noted to have no family history of sudden death, no LV outflow tract obstruction, no ventricular arrhythmia, and no personal history of syncope.  He underwent cardiac catheterization in 2023 demonstrating patent coronary arteries with minimal nonobstructive plaquing and normal LVEDP of 14 mmHg.  The patient is here alone today.  He called in a few weeks ago because he was having substernal pressure in his chest.  He is had this in the past but became concerned about it.  He experiences when he was out playing golf.  He continues to exercise regularly and has not had any typical symptoms with physical exertion.  This does not bring on his chest discomfort.  He has no shortness of breath, lightheadedness, or heart palpitations.  No edema, orthopnea, or PND.  There is no pleuritic component to his chest pain.  Past Medical History:  Diagnosis Date   Arthritis    CAD in native artery non obstructive disease 09/01/2019   Hyperlipidemia    LVH (left ventricular hypertrophy) 08/12/2021   Unstable angina (HCC) 08/12/2021    Past Surgical History:  Procedure Laterality Date   ANAL FISSURE REPAIR  2006   HERNIA REPAIR Right 2006   LEFT HEART CATH AND CORONARY ANGIOGRAPHY N/A 08/11/2021   Procedure: LEFT HEART CATH AND CORONARY ANGIOGRAPHY;  Surgeon: Lennette Bihari, MD;  Location: [facility];  Service: Cardiovascular;  Laterality: N/A;    TOTAL SHOULDER ARTHROPLASTY Right 08/01/2019   Procedure: TOTAL SHOULDER ARTHROPLASTY;  Surgeon: Jones Broom, MD;  Location: [facility];  Service: Orthopedics;  Laterality: Right;    Current Medications: Current Meds  Medication Sig   Ascorbic Acid (VITAMIN C PO) Take by mouth. PRN   B-COMPLEX-C PO Take by mouth. PRN   rosuvastatin (CRESTOR) 20 MG tablet TAKE 1 TABLET BY MOUTH EVERY OTHER DAY   VITAMIN D PO Take by mouth. PRN     Allergies:   Patient has no known allergies.   Social History   Socioeconomic History   Marital status: Married    Spouse name: Not on file   Number of children: 1   Years of education: Not on file   Highest education level: Not on file  Occupational History   Not on file  Tobacco Use   Smoking status: Never   Smokeless tobacco: Never  Vaping Use   Vaping Use: Never used  Substance and Sexual Activity   Alcohol use: Yes    Alcohol/week: 6.0 standard drinks of alcohol    Types: 3 Glasses of wine, 3 Cans of beer per week    Comment: ocassional   Drug use: No   Sexual activity: Yes  Other Topics Concern   Not on file  Social History Narrative   Not on file   Social Determinants of Health   Financial Resource Strain: Not on file  Food Insecurity: Not on file  Transportation Needs: Not on file  Physical Activity: Unknown (08/29/2017)   Exercise Vital Sign    Days of Exercise per Week: 3 days    Minutes of Exercise per Session: Not on file  Stress: Not on file  Social Connections: Not on file     Family History: The patient's family history includes Cancer in his father; Dementia in his father and mother; Heart attack in his maternal grandmother.  ROS:   Please see the history of present illness.    All other systems reviewed and are negative.  EKGs/Labs/Other Studies Reviewed:    The following studies were reviewed today: Cardiac Studies & Procedures   CARDIAC CATHETERIZATION  CARDIAC CATHETERIZATION 08/11/2021  Narrative    Ramus lesion is 20% stenosed.   Prox LAD lesion is 20% stenosed.   Prox RCA lesion is 15% stenosed.   There is hyperdynamic left ventricular systolic function.   The left ventricular ejection fraction is greater than 65% by visual estimate.  Mild coronary calcification with mild nonobstructive CAD.  Hyperdynamic LV function with at least moderate left ventricular hypertrophy.  EF estimate greater than 65%.  LVEDP 14 mmHg.  RECOMMENDATION: Catheterization demonstrates mild coronary calcification without  significant coronary obstructive disease.  Consider potential for microvascular angina in the etiology of his chest discomfort.  ECG changes may be contributed by left ventricular hypertrophy/repolarization.  Consider possible low-dose beta-blocker or ARB therapy for improvement in endothelial function versus amlodipine.  Findings Coronary Findings Diagnostic  Dominance: Right  Left Anterior Descending Prox LAD lesion is 20% stenosed.  Ramus Intermedius Ramus lesion is 20% stenosed.  Right Coronary Artery Prox RCA lesion is 15% stenosed.  Intervention  No interventions have been documented.   STRESS TESTS  EXERCISE TOLERANCE TEST (ETT) 12/07/2021  Narrative   2.0 mm of down sloping ST depression (II, III, aVF, V5 and V6) was noted.  ETT with good exercise tolerance (11:00); no CP; normal BP response; 2-3 mm ST depression in the inferolateral leads felt to be nondiagnostic given left ventricular hypertrophy with repolarization abnormality at baseline; no exercise-induced ectopy.   ECHOCARDIOGRAM  ECHOCARDIOGRAM COMPLETE 08/11/2021  Narrative ECHOCARDIOGRAM REPORT    Patient Name:   Dave Rogers Date of Exam: 08/11/2021 Medical Rec #:  [id number]      Height:       71.0 in Accession #:    [id number]     Weight:       181.2 lb Date of Birth:  1961/05/21      BSA:          2.022 m Patient Age:    62 years       BP:           103/64 mmHg Patient Gender: M              HR:            66 bpm. Exam Location:  Inpatient  Procedure: 2D Echo, Cardiac Doppler and Color Doppler  Indications:    NSTEMI  History:        Patient has no prior history of Echocardiogram examinations.  Sonographer:    Vanetta Shawl Referring Phys: 66 RHONDA G BARRETT  IMPRESSIONS   1. Left ventricular ejection fraction, by estimation, is 65 to 70%. The left ventricle has hyperdynamic function. The left ventricle has no regional wall motion abnormalities. There is moderate left ventricular hypertrophy. Left ventricular diastolic parameters are consistent with Grade II diastolic dysfunction (pseudonormalization). 2. Right ventricular systolic function  is normal. The right ventricular size is normal. Tricuspid regurgitation signal is inadequate for assessing PA pressure. 3. The mitral valve is normal in structure. Trivial mitral valve regurgitation. No evidence of mitral stenosis. 4. The aortic valve is tricuspid. Aortic valve regurgitation is not visualized. No aortic stenosis is present. 5. The inferior vena cava is normal in size with <50% respiratory variability, suggesting right atrial pressure of 8 mmHg.  FINDINGS Left Ventricle: Left ventricular ejection fraction, by estimation, is 65 to 70%. The left ventricle has hyperdynamic function. The left ventricle has no regional wall motion abnormalities. The left ventricular internal cavity size was normal in size. There is moderate left ventricular hypertrophy. Left ventricular diastolic parameters are consistent with Grade II diastolic dysfunction (pseudonormalization).  Right Ventricle: The right ventricular size is normal. No increase in right ventricular wall thickness. Right ventricular systolic function is normal. Tricuspid regurgitation signal is inadequate for assessing PA pressure.  Left Atrium: Left atrial size was normal in size.  Right Atrium: Right atrial size was normal in size.  Pericardium: There is no evidence of  pericardial effusion.  Mitral Valve: The mitral valve is normal in structure. Trivial mitral valve regurgitation. No evidence of mitral valve stenosis.  Tricuspid Valve: The tricuspid valve is normal in structure. Tricuspid valve regurgitation is not demonstrated.  Aortic Valve: The aortic valve is tricuspid. Aortic valve regurgitation is not visualized. No aortic stenosis is present. Aortic valve mean gradient measures 4.0 mmHg. Aortic valve peak gradient measures 7.3 mmHg. Aortic valve area, by VTI measures 2.09 cm.  Pulmonic Valve: The pulmonic valve was normal in structure. Pulmonic valve regurgitation is not visualized.  Aorta: The aortic root is normal in size and structure.  Venous: The inferior vena cava is normal in size with less than 50% respiratory variability, suggesting right atrial pressure of 8 mmHg.  IAS/Shunts: No atrial level shunt detected by color flow Doppler.   LEFT VENTRICLE PLAX 2D LVIDd:         4.40 cm   Diastology LVIDs:         2.80 cm   LV e' medial:    6.31 cm/s LV PW:         1.30 cm   LV E/e' medial:  11.9 LV IVS:        1.50 cm   LV e' lateral:   11.40 cm/s LVOT diam:     2.00 cm   LV E/e' lateral: 6.6 LV SV:         64 LV SV Index:   32 LVOT Area:     3.14 cm   RIGHT VENTRICLE             IVC RV Basal diam:  3.80 cm     IVC diam: 2.20 cm RV Mid diam:    2.20 cm RV S prime:     14.80 cm/s  LEFT ATRIUM             Index        RIGHT ATRIUM           Index LA diam:        4.10 cm 2.03 cm/m   RA Area:     17.70 cm LA Vol (A2C):   54.5 ml 26.95 ml/m  RA Volume:   45.90 ml  22.70 ml/m LA Vol (A4C):   59.6 ml 29.48 ml/m LA Biplane Vol: 57.8 ml 28.59 ml/m AORTIC VALVE  PULMONIC VALVE AV Area (Vmax):    2.30 cm     PV Vmax:       1.28 m/s AV Area (Vmean):   2.17 cm     PV Peak grad:  6.6 mmHg AV Area (VTI):     2.09 cm AV Vmax:           135.00 cm/s AV Vmean:          94.900 cm/s AV VTI:            0.306 m AV Peak  Grad:      7.3 mmHg AV Mean Grad:      4.0 mmHg LVOT Vmax:         99.00 cm/s LVOT Vmean:        65.600 cm/s LVOT VTI:          0.204 m LVOT/AV VTI ratio: 0.67  AORTA Ao Root diam: 3.60 cm  MITRAL VALVE MV Area (PHT): 4.24 cm    SHUNTS MV Decel Time: 179 msec    Systemic VTI:  0.20 m MV E velocity: 75.20 cm/s  Systemic Diam: 2.00 cm MV A velocity: 59.70 cm/s MV E/A ratio:  1.26  Dalton McleanMD Electronically signed by Wilfred Lacy Signature Date/Time: 08/11/2021/3:48:46 PM    Final    MONITORS  LONG TERM MONITOR (3-14 DAYS) 05/29/2022  Narrative   Patient had a minimum heart rate of 40 bpm, maximum heart rate of 120 bpm, and average heart rate of 61 bpm.   Predominant underlying rhythm was sinus rhythm.   Two short runs of SVT lasting 7 beats at longest.   Isolated PACs were rare (<1.0%).   Isolated PVCs were rare (<1.0%).   No triggered and diary events.  No A fib or NSVT in the setting of HCM.    CARDIAC MRI  MR CARDIAC MORPHOLOGY W WO CONTRAST 12/30/2021  Narrative CLINICAL DATA:  Clinical question of hypertrophic cardiomyopathy Study assumes HCT of 38 and BSA of 2.03 m2  EXAM: CARDIAC MRI  TECHNIQUE: The patient was scanned on a 1.5 Tesla GE magnet. A dedicated cardiac coil was used. Functional imaging was done using Fiesta sequences. 2,3, and 4 chamber views were done to assess for RWMA's. Modified Simpson's rule using a short axis stack was used to calculate an ejection fraction on a dedicated work [employer]. The patient received 9 cc of Gadavist. After 10 minutes inversion recovery sequences were used to assess for infiltration and scar tissue.  CONTRAST:  9 cc  of Gadavist  FINDINGS: 1. Normal left ventricular size, with LVEDD 44 mm, and LVEDVi 56 mL/m2.  Severe asymmetric septal hypertrophy, with intraventricular septal thickness of 15 mm, posterior wall thickness of 10 mm, and normal myocardial mass index of 61  g/m2.  Normal left ventricular systolic function (LVEF =66%). There are no regional wall motion abnormalities.  Left ventricular parametric mapping notable for normal T2.  ECV signal is mildly elevated in mid septum 37%.  There is no late gadolinium enhancement in the left ventricular myocardium. Six standard deviation assessment used.  2. Normal right ventricular size with RVEDVI 65 mL/m2.  Normal right ventricular thickness.  Normal right ventricular systolic function (RVEF =55%). There are no regional wall motion abnormalities or aneurysms.  3.  Normal left and right atrial size.  4.  Normal size of the aortic root and pulmonary artery.  Mild ascending aortic aneurysm 42 mm.  5. Valve assessment:  Aortic Valve: Tri-leaflet aortic valve. Regurgitant  fraction 13%. Mild aortic regurgitation.  Pulmonic Valve: Qualitatively, there is no significant regurgitation.  Tricuspid Valve: Qualitatively, there is no significant regurgitation.  Mitral Valve: There is no significant regurgitation.  6.  Normal pericardium.  No pericardial effusion.  7. Grossly, no extracardiac findings. Recommended dedicated study if concerned for non-cardiac pathology.  IMPRESSION: Evidence of mild ascending aortic dilation 42 mm. Consider secondary imaging modality (echocardiogram, CTA Aorta Protocol, MRA Aorta Protocol) in one year if clinically indicated.  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 No LGE. Maximal thickness 15 mm. No LVOT Obstruction on this study.  Riley Lam MD   Electronically Signed By: Riley Lam M.D. On: 12/30/2021 21:32          EKG:   EKG Interpretation Date/Time:  Monday January 23 2023 14:23:50 EDT Ventricular Rate:  52 PR Interval:  164 QRS Duration:  104 QT Interval:  384 QTC Calculation: 357 R Axis:   63  Text Interpretation: Sinus bradycardia ST & T wave abnormality, consider  inferolateral ischemia When compared with ECG of 12-Aug-2021 07:00, T wave inversion less evident in Anterolateral leads QT has shortened Confirmed by Tonny Bollman [phone]) on 01/23/2023 2:35:35 PM    Recent Labs: 09/01/2022: Magnesium 2.3; TSH 1.16 12/19/2022: ALT 14; BUN 13; Creat 0.75; Hemoglobin 14.0; Platelets 184; Potassium 3.9; Sodium 136  Recent Lipid Panel    Component Value Date/Time   CHOL 139 12/19/2022 1418   TRIG 173 (H) 12/19/2022 1418   HDL 62 12/19/2022 1418   CHOLHDL 2.2 12/19/2022 1418   VLDL 20 08/11/2021 0149   LDLCALC 52 12/19/2022 1418     Risk Assessment/Calculations:                Physical Exam:    VS:  BP 124/72   Pulse (!) 52   Ht 5\' 11"  (1.803 m)   Wt 192 lb 9.6 oz (87.4 kg)   SpO2 96%   BMI 26.86 kg/m     Wt Readings from Last 3 Encounters:  01/23/23 192 lb 9.6 oz (87.4 kg)  12/19/22 192 lb 12.8 oz (87.5 kg)  09/01/22 191 lb 9.6 oz (86.9 kg)     GEN:  Well nourished, well developed in no acute distress HEENT: Normal NECK: No JVD; No carotid bruits LYMPHATICS: No lymphadenopathy CARDIAC: RRR,/6 midsystolic murmur at the left lower sternal border RESPIRATORY:  Clear to auscultation without rales, wheezing or rhonchi  ABDOMEN: Soft, non-tender, non-distended MUSCULOSKELETAL:  No edema; No deformity  SKIN: Warm and dry NEUROLOGIC:  Alert and oriented x 3 PSYCHIATRIC:  Normal affect   ASSESSMENT:    1. Chest pain, unspecified type   2. Hypertrophic nonobstructive cardiomyopathy (HCC)    PLAN:    In order of problems listed above:  EKG today is reassuring.  There are no significant changes from his past tracings, and in fact is T wave inversions are somewhat less pronounced.  In the setting of his hypertrophic cardiomyopathy, will update an echocardiogram.  Otherwise no specific testing is indicated.  His cardiac catheterization from last year was reviewed with widely patent coronary arteries and no significant obstructive  disease. Discussed natural history today.  I reviewed the notes of Dr. Rutherford Limerick.  The patient's cardiac MRI is reviewed.  Overall he appears clinically stable.  Will update an echo as above.           Medication Adjustments/Labs and Tests Ordered: Current medicines are reviewed at length with the patient today.  Concerns regarding medicines are outlined above.  Orders Placed This Encounter  Procedures   EKG 12-Lead   ECHOCARDIOGRAM COMPLETE   No orders of the defined types were placed in this encounter.   Patient Instructions  Medication Instructions:  Your physician recommends that you continue on your current medications as directed. Please refer to the Current Medication list given to you today.  *If you need a refill on your cardiac medications before your next appointment, please call your pharmacy*  Lab Work: NONE If you have labs (blood work) drawn today and your tests are completely normal, you will receive your results only by: MyChart Message (if you have MyChart) OR A paper copy in the mail If you have any lab test that is abnormal or we need to change your treatment, we will call you to review the results.  Testing/Procedures: ECHO Your physician has requested that you have an echocardiogram. Echocardiography is a painless test that uses sound waves to create images of your heart. It provides your doctor with information about the size and shape of your heart and how well your heart's chambers and valves are working. This procedure takes approximately one hour. There are no restrictions for this procedure. Please do NOT wear cologne, perfume, aftershave, or lotions (deodorant is allowed). Please arrive 15 minutes prior to your appointment time.  Follow-Up: At [facility], you and your health needs are our priority.  As part of our continuing mission to provide you with exceptional heart care, we have created designated Provider Care Teams.  These Care  Teams include your primary Cardiologist (physician) and Advanced Practice Providers (APPs -  Physician Assistants and Nurse Practitioners) who all work together to provide you with the care you need, when you need it.  We recommend signing up for the patient portal called "MyChart".  Sign up information is provided on this After Visit Summary.  MyChart is used to connect with patients for Virtual Visits (Telemedicine).  Patients are able to view lab/test results, encounter notes, upcoming appointments, etc.  Non-urgent messages can be sent to your provider as well.   To learn more about what you can do with MyChart, go to [email or web address].    Your next appointment:   1 year(s)  Provider: Tonny Bollman, MD   Signed, Tonny Bollman, MD  01/23/2023 4:12 PM    [facility]

## 2023-02-07 DIAGNOSIS — M7918 Myalgia, other site: Secondary | ICD-10-CM | POA: Diagnosis not present

## 2023-02-07 DIAGNOSIS — M25652 Stiffness of left hip, not elsewhere classified: Secondary | ICD-10-CM | POA: Diagnosis not present

## 2023-02-07 DIAGNOSIS — M9903 Segmental and somatic dysfunction of lumbar region: Secondary | ICD-10-CM | POA: Diagnosis not present

## 2023-02-07 DIAGNOSIS — M25651 Stiffness of right hip, not elsewhere classified: Secondary | ICD-10-CM | POA: Diagnosis not present

## 2023-02-07 DIAGNOSIS — M9905 Segmental and somatic dysfunction of pelvic region: Secondary | ICD-10-CM | POA: Diagnosis not present

## 2023-02-07 DIAGNOSIS — M9904 Segmental and somatic dysfunction of sacral region: Secondary | ICD-10-CM | POA: Diagnosis not present

## 2023-02-07 DIAGNOSIS — M545 Low back pain, unspecified: Secondary | ICD-10-CM | POA: Diagnosis not present

## 2023-02-15 ENCOUNTER — Other Ambulatory Visit (HOSPITAL_COMMUNITY): Payer: BC Managed Care – PPO

## 2023-02-23 ENCOUNTER — Ambulatory Visit (HOSPITAL_COMMUNITY): Payer: BC Managed Care – PPO | Attending: Internal Medicine

## 2023-02-23 DIAGNOSIS — I422 Other hypertrophic cardiomyopathy: Secondary | ICD-10-CM | POA: Insufficient documentation

## 2023-02-23 DIAGNOSIS — R079 Chest pain, unspecified: Secondary | ICD-10-CM | POA: Insufficient documentation

## 2023-02-23 LAB — ECHOCARDIOGRAM COMPLETE
Area-P 1/2: 1.81 cm2
S' Lateral: 2.5 cm

## 2023-03-09 ENCOUNTER — Ambulatory Visit: Payer: BC Managed Care – PPO | Admitting: Internal Medicine

## 2023-03-23 ENCOUNTER — Encounter: Payer: Self-pay | Admitting: Internal Medicine

## 2023-03-23 NOTE — Patient Instructions (Signed)

## 2023-03-23 NOTE — Progress Notes (Unsigned)
History of Present Illness:  Future Appointments  Date Time Provider Department  03/24/2023                 6 mo ov 10:30 AM Lucky Cowboy, MD [facility]  06/06/2023  8:40 AM Christell Constant, MD [facility]  09/08/2023                cpe 10:00 AM Lucky Cowboy, MD [facility]         This very nice 62 y.o.  MWM presents for 6 month follow up with labile elevated BP, ASCAD, HLD, Pre-Diabetes and Vitamin D Deficiency.         Patient is followed expectantly for elevated BP & BP has been controlled at home. Today's BP off meds  is at goal -  102/60.  Chest CT in 2021 showed Aortic Atherosclerosis.  Heart cath in Jan 2023 shown mild non-obstructive CAD & LVH . 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 Patient has had no complaints of any cardiac type chest pain, palpitations, dyspnea Pollyann Kennedy /PND, dizziness, claudication or dependent edema.        Hyperlipidemia is not controlled with diet & he was started on Rosuvastatin in 2021 . Patient denies myalgias or other med SE's. Last Lipids were at goal :  Lab Results  Component Value Date   CHOL 139 12/19/2022   HDL 62 12/19/2022   LDLCALC 52 12/19/2022   TRIG 173 (H) 12/19/2022   CHOLHDL 2.2 12/19/2022     Also, the patient is monitored expectantly for glucose intolerance and has had no symptoms of reactive hypoglycemia, diabetic polys, paresthesias or visual blurring.  Last A1c was Normal & at goal :  Lab Results  Component Value Date   HGBA1C 5.1 09/01/2022         Further, the patient also has history of Vitamin D Deficiency ("19" / Feb 2019)  and supplements vitamin D without any suspected side-effects. Last vitamin D was still low and he reports that he has increased his dose since then :  Lab Results  Component Value Date   VD25OH 73 12/19/2022       Current Outpatient Medications  Medication Instructions   VITAMIN C  PRN   B-COMPLEX-C  PRN   rosuvastatin   20 MG tablet TAKE 1 TABLET  EVERY OTHER DAY   VITAMIN D      PRN    No Known Allergies    PMHx:   Past Medical History:  Diagnosis Date   Arthritis    CAD in native artery non obstructive disease 09/01/2019   Hyperlipidemia    LVH (left ventricular hypertrophy) 08/12/2021   Unstable angina (HCC) 08/12/2021     Immunization History  Administered Date(s) Administered   PFIZER-SARS-COV-2 Vacc 10/04/2019, 10/25/2019   PPD Test 02/19/2019, 08/18/2020, 08/26/2021   Tdap 11/16/2019     Past Surgical History:  Procedure Laterality Date   ANAL FISSURE REPAIR  2006   HERNIA REPAIR Right 2006   LEFT HEART CATH AND CORONARY ANGIOGRAPHY N/A 08/11/2021   Procedure: LEFT HEART CATH AND CORONARY ANGIOGRAPHY;  Surgeon: Lennette Bihari, MD;  Location: [facility];  Service: Cardiovascular;  Laterality: N/A;   TOTAL SHOULDER ARTHROPLASTY Right 08/01/2019   Procedure: TOTAL SHOULDER ARTHROPLASTY;  Surgeon: Jones Broom, MD;  Location: [facility];  Service: Orthopedics;  Laterality: Right;     FHx:    Reviewed /  unchanged  SHx:    Reviewed / unchanged    Systems Review:  Constitutional: Denies fever, chills, wt changes, headaches, insomnia, fatigue, night sweats, change in appetite. Eyes: Denies redness, blurred vision, diplopia, discharge, itchy, watery eyes.  ENT: Denies discharge, congestion, post nasal drip, epistaxis, sore throat, earache, hearing loss, dental pain, tinnitus, vertigo, sinus pain, snoring.  CV: Denies chest pain, palpitations, irregular heartbeat, syncope, dyspnea, diaphoresis, orthopnea, PND, claudication or edema. Respiratory: denies cough, dyspnea, DOE, pleurisy, hoarseness, laryngitis, wheezing.  Gastrointestinal: Denies dysphagia, odynophagia, heartburn, reflux, water brash, abdominal pain or cramps, nausea, vomiting, bloating, diarrhea, constipation, hematemesis, melena, hematochezia  or hemorrhoids. Genitourinary: Denies dysuria, frequency, urgency,  nocturia, hesitancy, discharge, hematuria or flank pain. Musculoskeletal: Denies arthralgias, myalgias, stiffness, jt. swelling, pain, limping or strain/sprain.  Skin: Denies pruritus, rash, hives, warts, acne, eczema or change in skin lesion(s). Neuro: No weakness, tremor, incoordination, spasms, paresthesia or pain. Psychiatric: Denies confusion, memory loss or sensory loss. Endo: Denies change in weight, skin or hair change.  Heme/Lymph: No excessive bleeding, bruising or enlarged lymph nodes.    Physical Exam  BP 100/62   Pulse (!) 52   Temp 97.7 F (36.5 C)   Resp 16   Ht 5\' 11"  (1.803 m)   Wt 195 lb 3.2 oz (88.5 kg)   SpO2 95%   BMI 27.22 kg/m   Appears  well nourished, well groomed  and in no distress.  Eyes: PERRLA, EOMs, conjunctiva no swelling or erythema. Sinuses: No frontal/maxillary tenderness ENT/Mouth: EAC's clear, TM's nl w/o erythema, bulging. Nares clear w/o erythema, swelling, exudates. Oropharynx clear without erythema or exudates. Oral hygiene is good. Tongue normal, non obstructing. Hearing intact.  Neck: Supple. Thyroid not palpable. Car 2+/2+ without bruits, nodes or JVD. Chest: Respirations nl with BS clear & equal w/o rales, rhonchi, wheezing or stridor.  Cor: Heart sounds normal w/ regular rate and rhythm without sig. murmurs, gallops, clicks or rubs. Peripheral pulses normal and equal  without edema.  Abdomen: Soft & bowel sounds normal. Non-tender w/o guarding, rebound, hernias, masses or organomegaly.  Lymphatics: Unremarkable.  Musculoskeletal: Full ROM all peripheral extremities, joint stability, 5/5 strength and normal gait.  Skin: Warm, dry without exposed rashes, lesions or ecchymosis apparent.  Neuro: Cranial nerves intact, reflexes equal bilaterally. Sensory-motor testing grossly intact. Tendon reflexes grossly intact.  Pysch: Alert & oriented x 3.  Insight and judgement nl & appropriate. No ideations.  Assessment and Plan:   1. Elevated  BP without diagnosis of hypertension  - CBC with Differential/Platelet - COMPLETE METABOLIC PANEL WITH GFR - Magnesium - TSH   2. Hyperlipidemia, mixed  - Lipid panel - TSH   3. Abnormal glucose  - Hemoglobin A1c - Insulin, random   4. Vitamin D deficiency  - VITAMIN D 25 Hydroxy    5. Aortic atherosclerosis (HCC)  - Lipid panel   6. CAD in native artery non obstructive disease  - Lipid panel   7. Medication management  - CBC with Differential/Platelet - COMPLETE METABOLIC PANEL WITH GFR - Magnesium - Lipid panel - TSH - Hemoglobin A1c - Insulin, random - VITAMIN D 25 Hydroxy         Discussed  regular exercise, BP monitoring, weight control to achieve/maintain BMI less than 25 and discussed med and SE's. Recommended labs to assess and monitor clinical status with further disposition pending results of labs.  I discussed the assessment and treatment plan with the patient. The patient was provided an opportunity to ask questions  and all were answered. The patient agreed with the plan and demonstrated an understanding of the instructions.  I provided over 30 minutes of exam, counseling, chart review and  complex critical decision making.   Marinus Maw, MD

## 2023-03-24 ENCOUNTER — Encounter: Payer: Self-pay | Admitting: Internal Medicine

## 2023-03-24 ENCOUNTER — Ambulatory Visit (INDEPENDENT_AMBULATORY_CARE_PROVIDER_SITE_OTHER): Payer: BC Managed Care – PPO | Admitting: Internal Medicine

## 2023-03-24 VITALS — BP 100/62 | HR 52 | Temp 97.7°F | Resp 16 | Ht 71.0 in | Wt 195.2 lb

## 2023-03-24 DIAGNOSIS — R030 Elevated blood-pressure reading, without diagnosis of hypertension: Secondary | ICD-10-CM | POA: Diagnosis not present

## 2023-03-24 DIAGNOSIS — E782 Mixed hyperlipidemia: Secondary | ICD-10-CM

## 2023-03-24 DIAGNOSIS — E559 Vitamin D deficiency, unspecified: Secondary | ICD-10-CM

## 2023-03-24 DIAGNOSIS — I700 Atherosclerosis of aorta: Secondary | ICD-10-CM

## 2023-03-24 DIAGNOSIS — R7309 Other abnormal glucose: Secondary | ICD-10-CM | POA: Diagnosis not present

## 2023-03-24 DIAGNOSIS — Z79899 Other long term (current) drug therapy: Secondary | ICD-10-CM

## 2023-03-24 DIAGNOSIS — I2510 Atherosclerotic heart disease of native coronary artery without angina pectoris: Secondary | ICD-10-CM

## 2023-03-25 ENCOUNTER — Encounter: Payer: Self-pay | Admitting: Internal Medicine

## 2023-03-25 NOTE — Progress Notes (Signed)
<>*<>*<>*<>*<>*<>*<>*<>*<>*<>*<>*<>*<>*<>*<>*<>*<>*<>*<>*<>*<>*<>*<>*<>*<> <>*<>*<>*<>*<>*<>*<>*<>*<>*<>*<>*<>*<>*<>*<>*<>*<>*<>*<>*<>*<>*<>*<>*<>*<>  -  Test results slightly outside the reference range are not unusual. If there is anything important, I will review this with you,  otherwise it is considered normal test values.  If you have further questions,  please do not hesitate to contact me at the office or via My Chart.   <>*<>*<>*<>*<>*<>*<>*<>*<>*<>*<>*<>*<>*<>*<>*<>*<>*<>*<>*<>*<>*<>*<>*<>*<> <>*<>*<>*<>*<>*<>*<>*<>*<>*<>*<>*<>*<>*<>*<>*<>*<>*<>*<>*<>*<>*<>*<>*<>*<>  -  Chol = 152   -  Excellent   - Very low risk for Heart Attack  / Stroke  ^>^>^>^>^>^>^>^>^>^>^>^>^>^>^>^>^>^>^>^>^>^>^>^>^>^>^>^>^>^>^>^>^>^>^>^>^ ^>^>^>^>^>^>^>^>^>^>^>^>^>^>^>^>^>^>^>^>^>^>^>^>^>^>^>^>^>^>^>^>^>^>^>^>^  -  A1c - Normal - No Diabetes  - Great !   <>*<>*<>*<>*<>*<>*<>*<>*<>*<>*<>*<>*<>*<>*<>*<>*<>*<>*<>*<>*<>*<>*<>*<>*<>  -  Vitamin D = 74 - Excellent  !      Please keep dosage same   <>*<>*<>*<>*<>*<>*<>*<>*<>*<>*<>*<>*<>*<>*<>*<>*<>*<>*<>*<>*<>*<>*<>*<>*<> <>*<>*<>*<>*<>*<>*<>*<>*<>*<>*<>*<>*<>*<>*<>*<>*<>*<>*<>*<>*<>*<>*<>*<>*<>  -  Keep up the [facility] Work  !   <>*<>*<>*<>*<>*<>*<>*<>*<>*<>*<>*<>*<>*<>*<>*<>*<>*<>*<>*<>*<>*<>*<>*<>*<> <>*<>*<>*<>*<>*<>*<>*<>*<>*<>*<>*<>*<>*<>*<>*<>*<>*<>*<>*<>*<>*<>*<>*<>*<>

## 2023-03-27 LAB — VITAMIN D 25 HYDROXY (VIT D DEFICIENCY, FRACTURES): Vit D, 25-Hydroxy: 74 ng/mL (ref 30–100)

## 2023-03-27 LAB — CBC WITH DIFFERENTIAL/PLATELET
Absolute Monocytes: 281 {cells}/uL (ref 200–950)
Basophils Absolute: 22 {cells}/uL (ref 0–200)
Basophils Relative: 0.6 %
Eosinophils Absolute: 50 {cells}/uL (ref 15–500)
Eosinophils Relative: 1.4 %
HCT: 43.3 % (ref 38.5–50.0)
Hemoglobin: 14.8 g/dL (ref 13.2–17.1)
Lymphs Abs: 878 {cells}/uL (ref 850–3900)
MCH: 31.4 pg (ref 27.0–33.0)
MCHC: 34.2 g/dL (ref 32.0–36.0)
MCV: 91.7 fL (ref 80.0–100.0)
MPV: 10.1 fL (ref 7.5–12.5)
Monocytes Relative: 7.8 %
Neutro Abs: 2369 {cells}/uL (ref 1500–7800)
Neutrophils Relative %: 65.8 %
Platelets: 194 10*3/uL (ref 140–400)
RBC: 4.72 10*6/uL (ref 4.20–5.80)
RDW: 12.6 % (ref 11.0–15.0)
Total Lymphocyte: 24.4 %
WBC: 3.6 10*3/uL — ABNORMAL LOW (ref 3.8–10.8)

## 2023-03-27 LAB — HEMOGLOBIN A1C
Hgb A1c MFr Bld: 5.2 %{Hb} (ref <5.7)
Mean Plasma Glucose: 103 mg/dL
eAG (mmol/L): 5.7 mmol/L

## 2023-03-27 LAB — MAGNESIUM: Magnesium: 2 mg/dL (ref 1.5–2.5)

## 2023-03-27 LAB — COMPLETE METABOLIC PANEL WITH GFR
AG Ratio: 2.2 (calc) (ref 1.0–2.5)
ALT: 16 U/L (ref 9–46)
AST: 15 U/L (ref 10–35)
Albumin: 4.6 g/dL (ref 3.6–5.1)
Alkaline phosphatase (APISO): 58 U/L (ref 35–144)
BUN: 14 mg/dL (ref 7–25)
CO2: 24 mmol/L (ref 20–32)
Calcium: 10.9 mg/dL — ABNORMAL HIGH (ref 8.6–10.3)
Chloride: 105 mmol/L (ref 98–110)
Creat: 0.75 mg/dL (ref 0.70–1.35)
Globulin: 2.1 g/dL (ref 1.9–3.7)
Glucose, Bld: 81 mg/dL (ref 65–99)
Potassium: 4.4 mmol/L (ref 3.5–5.3)
Sodium: 137 mmol/L (ref 135–146)
Total Bilirubin: 0.8 mg/dL (ref 0.2–1.2)
Total Protein: 6.7 g/dL (ref 6.1–8.1)
eGFR: 103 mL/min/{1.73_m2} (ref >=60)

## 2023-03-27 LAB — LIPID PANEL
Cholesterol: 152 mg/dL (ref <200)
HDL: 65 mg/dL (ref >=40)
LDL Cholesterol (Calc): 69 mg/dL
Non-HDL Cholesterol (Calc): 87 mg/dL (ref <130)
Total CHOL/HDL Ratio: 2.3 (calc) (ref <5.0)
Triglycerides: 94 mg/dL (ref <150)

## 2023-03-27 LAB — TSH: TSH: 1.36 m[IU]/L (ref 0.40–4.50)

## 2023-03-27 LAB — INSULIN, RANDOM: Insulin: 4.2 u[IU]/mL

## 2023-04-21 DIAGNOSIS — H61899 Other specified disorders of external ear, unspecified ear: Secondary | ICD-10-CM | POA: Diagnosis not present

## 2023-04-25 NOTE — Progress Notes (Signed)
     Future Appointments  Date Time Provider Department  04/26/2023 10:00 AM Lucky Cowboy, MD [facility]  06/06/2023  8:40 AM Christell Constant, MD [facility]  06/28/2023 10:30 AM Adela Glimpse, NP [facility]  09/08/2023 10:00 AM Lucky Cowboy, MD [facility]    History of Present Illness:      Patient is  a very nice 62 yo MWM with followed expectant  elevated BP &  mild non-obstructive CAD & LVH  who was seen on 10/04 at an [facility] in [facility] for Right ear discomfort & was advised there were blisters in the [facility] & he was dx'd with Otitis Externa & Rx'd Doxycycline & Cortisporin Otic susp.Marland Kitchen  He presents today with ongoing Rt ear pa    Current Outpatient Medications on File Prior to Visit  Medication Sig   VITAMIN C  Take PRN   B-COMPLEX-C  Take PRN   rosuvastatin  20 MG tablet TAKE 1 TAB   EVERY OTHER DAY   VITAMIN D  Take  PRN    No Known Allergies   Problem list He has Hyperlipidemia, mixed; Vitamin D deficiency; BMI 26.0-26.9,adult; S/P reverse total shoulder arthroplasty, right; Aortic atherosclerosis (HCC) by Chest CTscan  07/30/2019; CAD in native artery non obstructive disease; NSTEMI (non-ST elevated myocardial infarction) (HCC); Unstable angina (HCC) 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   Observations/Objective:  BP 120/78   Pulse (!) 54   Temp 98.3 F (36.8 C)   Ht 5\' 11"  (1.803 m)   Wt 194 lb 3.2 oz (88.1 kg)   BMI 27.09 kg/m   HEENT - Rt EAC  - ceruminous with exudate.    ? Granuloma at [facility] /TM juncture  & TM obscured by exudate . Hearing intact.  Lt EAR / TM Nl . Tender Rt TMJt.   N/O/P - Clear   Neck - supple. Car 2 =/2+ w/o B, N, JVD   Assessment and Plan:   1. Infectious otitis externa, right  - ENT Referral  2. Arthralgia of right temporomandibular joint  - dexamethasone (DECADRON) 4 MG  tablet;  Take 1 tab 3 x /day for 2 days, then 2 x /day for 2  Days, then 1 tab daily   Dispense: 13 tablet; Refill: 0  Follow Up Instructions:        I discussed the assessment and treatment plan with the patient. The patient was provided an opportunity to ask questions and all were answered. The patient agreed with the plan and demonstrated an understanding of the instructions.       The patient was advised to call back or seek an in-person evaluation if the symptoms worsen or if the condition fails to improve as anticipated.   Marinus Maw, MD

## 2023-04-25 NOTE — Progress Notes (Incomplete)
     Future Appointments  Date Time Provider Department  04/26/2023 10:00 AM Lucky Cowboy, MD [facility]  06/06/2023  8:40 AM Christell Constant, MD [facility]  06/28/2023 10:30 AM Adela Glimpse, NP [facility]  09/08/2023 10:00 AM Lucky Cowboy, MD [facility]    History of Present Illness:      Patient is  a very nice 62 yo MWM with followed expectant  elevated BP &  mild non-obstructive CAD & LVH  who was seen on 10/04 at an [facility] in [facility] for Right ear discomfort & was advised there were blisters in the [facility] & he was dx'd with Otitis Externa & Rx'd Doxycycline & Cortisporin Otic susp.Marland Kitchen  He presents today       Current Outpatient Medications on File Prior to Visit  Medication Sig  . VITAMIN C  Take PRN  . B-COMPLEX-C  Take PRN  . rosuvastatin  20 MG tablet TAKE 1 TAB   EVERY OTHER DAY  . 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   Observations/Objective:  There were no vitals taken for this visit.  HEENT - Rt EAC                                  Lt EAR / TM Nl                    N/O/P - Clear   Neck - supple.  Chest - Clear equal BS. Cor - Nl HS. RRR w/o sig MGR. PP 1(+). No edema. MS- FROM w/o deformities.  Gait Nl. Neuro -  Nl w/o focal abnormalities.   Assessment and Plan:      Follow Up Instructions:        I discussed the assessment and treatment plan with the patient. The patient was provided an opportunity to ask questions and all were answered.  The patient agreed with the plan and demonstrated an understanding of the instructions.       The patient was advised to call back or seek an in-person evaluation if the symptoms worsen or if the condition fails to improve as anticipated.    Marinus Maw, MD

## 2023-04-26 ENCOUNTER — Ambulatory Visit (INDEPENDENT_AMBULATORY_CARE_PROVIDER_SITE_OTHER): Payer: BC Managed Care – PPO | Admitting: Internal Medicine

## 2023-04-26 ENCOUNTER — Encounter: Payer: Self-pay | Admitting: Internal Medicine

## 2023-04-26 VITALS — BP 120/78 | HR 54 | Temp 98.3°F | Ht 71.0 in | Wt 194.2 lb

## 2023-04-26 DIAGNOSIS — H60391 Other infective otitis externa, right ear: Secondary | ICD-10-CM

## 2023-04-26 DIAGNOSIS — M26621 Arthralgia of right temporomandibular joint: Secondary | ICD-10-CM

## 2023-04-26 MED ORDER — DEXAMETHASONE 4 MG PO TABS
ORAL_TABLET | ORAL | 0 refills | Status: DC
Start: 2023-04-26 — End: 2023-06-19

## 2023-04-26 NOTE — Patient Instructions (Signed)
Otitis Externa  Otitis externa is an infection of the outer ear canal. The outer ear canal is the area between the outside of the ear and the eardrum. Otitis externa is sometimes called swimmer's ear. What are the causes? Common causes of this condition include: Swimming in dirty water. Moisture in the ear. An injury to the inside of the ear. An object stuck in the ear. A cut or scrape on the outside of the ear or in the ear canal. What increases the risk? You are more likely to develop this condition if you go swimming often. What are the signs or symptoms? The first symptom of this condition is often itching in the ear. Later symptoms of the condition include: Swelling of the ear. Redness in the ear. Ear pain. The pain may get worse when you pull on your ear. Pus coming from the ear. How is this diagnosed? This condition may be diagnosed by examining the ear and testing fluid from the ear for bacteria and funguses. How is this treated? This condition may be treated with: Antibiotic ear drops. These are often given for 10-14 days. Medicines to reduce itching and swelling. Follow these instructions at home: If you were prescribed antibiotic ear drops, use them as told by your health care provider. Do not stop using the antibiotic even if you start to feel better. Take over-the-counter and prescription medicines only as told by your health care provider. Avoid getting water in your ears as told by your health care provider. This may include avoiding swimming or water sports for a few days. Keep all follow-up visits. This is important. How is this prevented? Keep your ears dry. Use the corner of a towel to dry your ears after you swim or bathe. Avoid scratching or putting things in your ear. Doing these things can damage the ear canal or remove the protective wax that lines it, which makes it easier for bacteria and funguses to grow. Avoid swimming in lakes, polluted water, or swimming  pools that may not have enough chlorine. Contact a health care provider if: You have a fever. Your ear is still red, swollen, painful, or draining pus after 3 days. Your redness, swelling, or pain gets worse. You have a severe headache. Get help right away if: You have redness, swelling, and pain or tenderness in the area behind your ear. Summary Otitis externa is an infection of the outer ear canal. Common causes include swimming in dirty water, moisture in the ear, or a cut or scrape in the ear. Symptoms include pain, redness, and swelling of the ear canal. If you were prescribed antibiotic ear drops, use them as told by your health care provider. Do not stop using the antibiotic even if you start to feel better. =================================================

## 2023-05-04 ENCOUNTER — Ambulatory Visit: Payer: PRIVATE HEALTH INSURANCE | Admitting: Cardiovascular Disease

## 2023-05-08 DIAGNOSIS — H6693 Otitis media, unspecified, bilateral: Secondary | ICD-10-CM | POA: Diagnosis not present

## 2023-05-08 DIAGNOSIS — H9202 Otalgia, left ear: Secondary | ICD-10-CM | POA: Diagnosis not present

## 2023-05-22 DIAGNOSIS — H918X3 Other specified hearing loss, bilateral: Secondary | ICD-10-CM | POA: Diagnosis not present

## 2023-05-22 DIAGNOSIS — H61813 Exostosis of external canal, bilateral: Secondary | ICD-10-CM | POA: Diagnosis not present

## 2023-06-06 ENCOUNTER — Ambulatory Visit: Payer: BC Managed Care – PPO | Admitting: Internal Medicine

## 2023-06-19 ENCOUNTER — Encounter: Payer: Self-pay | Admitting: Nurse Practitioner

## 2023-06-19 ENCOUNTER — Ambulatory Visit (INDEPENDENT_AMBULATORY_CARE_PROVIDER_SITE_OTHER): Payer: BC Managed Care – PPO | Admitting: Nurse Practitioner

## 2023-06-19 VITALS — BP 114/64 | HR 51 | Temp 97.9°F | Ht 71.0 in | Wt 188.4 lb

## 2023-06-19 DIAGNOSIS — K611 Rectal abscess: Secondary | ICD-10-CM | POA: Diagnosis not present

## 2023-06-19 DIAGNOSIS — R030 Elevated blood-pressure reading, without diagnosis of hypertension: Secondary | ICD-10-CM

## 2023-06-19 MED ORDER — CEPHALEXIN 500 MG PO CAPS
500.0000 mg | ORAL_CAPSULE | Freq: Three times a day (TID) | ORAL | 0 refills | Status: DC
Start: 2023-06-19 — End: 2023-11-22

## 2023-06-19 NOTE — Progress Notes (Signed)
Assessment and Plan:  Dave Rogers was seen today for hemorrhoids.  Diagnoses and all orders for this visit:  Elevated BP without diagnosis of hypertension - continue  DASH diet, exercise and monitor at home. Call if greater than 130/80.    Perirectal abscess Continue sitz baths 3-4 times a day Start Keflex 500 mg TID x 7 days Urgent referral to general surgery- if have no heard from them by tomorrow notify the office.  If pain worsens go to ER -     cephALEXin (KEFLEX) 500 MG capsule; Take 1 capsule (500 mg total) by mouth 3 (three) times daily. -     Ambulatory referral to General Surgery       Further disposition pending results of labs. Discussed med's effects and SE's.   Over 30 minutes of exam, counseling, chart review, and critical decision making was performed.   Future Appointments  Date Time Provider Department Center  06/28/2023 10:30 AM Adela Glimpse, NP [facility] None  10/02/2023 10:00 AM Lucky Cowboy, MD [facility] None    ------------------------------------------------------------------------------------------------------------------   HPI BP 114/64   Pulse (!) 51   Temp 97.9 F (36.6 C)   Ht 5\' 11"  (1.803 m)   Wt 188 lb 6.4 oz (85.5 kg)   SpO2 97%   BMI 26.28 kg/m   62 y.o.male presents for evaluation of hemorrhoids.  He has a history of hemorrhoids but the plane was much worse than previously. He flew home Friday and was painful. Saturday and Sunday took multiple warm baths which has helped the pain. He has been using preparation H and metamucil.  BP well controlled without medication BP Readings from Last 3 Encounters:  06/19/23 114/64  04/26/23 120/78  03/24/23 100/62  Denies headaches, chest pain, shortness of breath and dizziness   Pulse Readings from Last 3 Encounters:  06/19/23 (!) 51  04/26/23 (!) 54  03/24/23 (!) 52     BMI is Body mass index is 26.28 kg/m., he has been working on diet and exercise. Wt Readings from Last 3  Encounters:  06/19/23 188 lb 6.4 oz (85.5 kg)  04/26/23 194 lb 3.2 oz (88.1 kg)  03/24/23 195 lb 3.2 oz (88.5 kg)     Past Medical History:  Diagnosis Date   Arthritis    CAD in native artery non obstructive disease 09/01/2019   Hyperlipidemia    LVH (left ventricular hypertrophy) 08/12/2021   Unstable angina (HCC) 08/12/2021     No Known Allergies  Current Outpatient Medications on File Prior to Visit  Medication Sig   Ascorbic Acid (VITAMIN C PO) Take by mouth. PRN   B-COMPLEX-C PO Take by mouth. PRN   METAMUCIL FIBER PO Take by mouth.   rosuvastatin (CRESTOR) 20 MG tablet TAKE 1 TABLET BY MOUTH EVERY OTHER DAY   VITAMIN D PO Take by mouth. PRN   dexamethasone (DECADRON) 4 MG tablet Take 1 tab 3 x /day for 2 days,      then 2 x /day for 2  Days,     then 1 tab daily (Patient not taking: Reported on 06/19/2023)   No current facility-administered medications on file prior to visit.    ROS: all negative except above.   Physical Exam:  BP 114/64   Pulse (!) 51   Temp 97.9 F (36.6 C)   Ht 5\' 11"  (1.803 m)   Wt 188 lb 6.4 oz (85.5 kg)   SpO2 97%   BMI 26.28 kg/m   General Appearance: Well nourished, appears somewhat  in pain Eyes: PERRLA, EOMs, conjunctiva no swelling or erythema Neck: Supple, thyroid normal.  Respiratory: Respiratory effort normal, BS equal bilaterally without rales, rhonchi, wheezing or stridor.  Cardio: RRR with no MRGs. Brisk peripheral pulses without edema.  Abdomen: Soft, + BS.  Non tender, no guarding, rebound, hernias, masses. Lymphatics: Non tender without lymphadenopathy.  Musculoskeletal: Full ROM, 5/5 strength, slight antalgic gait.  Skin: Warm, dry without rashes, lesions, ecchymosis.  Neuro: Cranial nerves intact. Normal muscle tone, no cerebellar symptoms. Sensation intact.  Psych: Awake and oriented X 3, normal affect, Insight and Judgment appropriate.  Rectal exam: negative without mass, lesions or tenderness, tenderness noted , rectal  mass at top of anus 1 cm firm rectal abscess.   Raynelle Dick, NP 12:06 PM [facility]

## 2023-06-19 NOTE — Patient Instructions (Signed)
Hemorrhoids Hemorrhoids are swollen veins in and around the rectum or the opening of the butt (anus). There are two types of hemorrhoids: Internal. These occur in the veins just inside the rectum. They may poke through to the outside and become irritated and painful. External. These occur in the veins outside the anus. They can be felt as a painful swelling or hard lump near the anus. Most hemorrhoids do not cause severe problems. Often, they can be treated at home with diet and lifestyle changes. If home treatments do not help, you may need a procedure to shrink or remove the hemorrhoids. What are the causes? Hemorrhoids are caused by pressure near the anus. This pressure may be caused by: Constipation or diarrhea. Straining to poop. Pregnancy. Obesity. Sitting or riding a bike for a long time. Heavy lifting or other things that cause you to strain. Anal sex. What are the signs or symptoms? Symptoms of this condition include: Pain. Anal itching or irritation. Bleeding from the rectum. Leakage of poop (stool). Swelling of the anus. One or more lumps around the anus. How is this diagnosed? Hemorrhoids can often be diagnosed through a visual exam. Other exams or tests may also be done, such as: A digital rectal exam. This is when your health care provider feels inside your rectum with a gloved finger. Anoscope. This is an exam of the anus using a small tube. A blood test, if you have lost a lot of blood. A sigmoidoscopy or colonoscopy. These are tests to look inside the colon using a tube with a camera on the end. How is this treated? In most cases, hemorrhoids can be treated at home with diet and lifestyle changes. If these changes do not help, you may need to have a procedure done. These procedures can make the hemorrhoids smaller or fully remove them. Common procedures include: Rubber band ligation. Rubber bands are placed at the base of the hemorrhoids to cut off their blood  supply. Sclerotherapy. Medicine is put into the hemorrhoids to shrink them. Infrared coagulation. A type of light energy is used to get rid of the hemorrhoids. Hemorrhoidectomy surgery. The hemorrhoids are removed during surgery. Then, the veins that supply them are tied off. Stapled hemorrhoidopexy surgery. The base of the hemorrhoid is stapled to the wall of the rectum. Follow these instructions at home: Medicines Take over-the-counter and prescription medicines only as told by your provider. Use medicated creams or medicines that are put in the rectum (suppositories) as told by your provider. Eating and drinking  Eat foods that are high in fiber, such as beans, whole grains, and fresh fruits and vegetables. Ask your provider about taking products that have fiber added to them (fiber supplements). Reduce the amount of fat in your diet. You can do this by eating low-fat dairy products, eating less red meat, and avoiding processed foods. Drink enough fluid to keep your pee (urine) pale yellow. Managing pain and swelling  Take warm sitz baths for 20 minutes, 3-4 times a day. This can help ease pain and discomfort. You may do this in a bathtub or you can use a portable sitz bath that fits over the toilet. If told, put ice on the affected area. It may help to use ice packs between sitz baths. Put ice in a plastic bag. Place a towel between your skin and the bag. Leave the ice on for 20 minutes, 2-3 times a day. If your skin turns bright red, remove the ice right away to prevent   skin damage. The risk of damage is higher if you cannot feel pain, heat, or cold. General instructions Exercise. Ask your provider how much and what kind of exercise is best for you. In general, you should do moderate exercise for at least 30 minutes on most days of the week (150 minutes each week). You may want to try walking, biking, or yoga. Go to the bathroom when you have the urge to poop. Do not wait. Avoid  straining to poop. Keep the anus dry and clean. Use wet toilet paper or moist towelettes after you poop. Do not sit on the toilet for a long time. This can increase blood pooling and pain. Where to find more information National Institute of Diabetes and Digestive and Kidney Diseases: niddk.nih.gov Contact a health care provider if: You have more pain and swelling that do not get better with treatment. You have trouble pooping or you are not able to poop. You have pain or inflammation outside the area of the hemorrhoids. Get help right away if: You are bleeding from your rectum and you cannot get it to stop. This information is not intended to replace advice given to you by your health care provider. Make sure you discuss any questions you have with your health care provider. Document Revised: 03/16/2022 Document Reviewed: 03/16/2022 Elsevier Patient Education  2024 Elsevier Inc.  

## 2023-06-20 ENCOUNTER — Other Ambulatory Visit: Payer: Self-pay

## 2023-06-20 MED ORDER — ROSUVASTATIN CALCIUM 20 MG PO TABS: ORAL_TABLET | ORAL | 2 refills | Status: DC

## 2023-06-21 DIAGNOSIS — K611 Rectal abscess: Secondary | ICD-10-CM | POA: Diagnosis not present

## 2023-06-28 ENCOUNTER — Ambulatory Visit: Payer: BC Managed Care – PPO | Admitting: Nurse Practitioner

## 2023-07-07 ENCOUNTER — Encounter: Payer: Self-pay | Admitting: Gastroenterology

## 2023-07-21 ENCOUNTER — Ambulatory Visit: Payer: BC Managed Care – PPO | Admitting: Nurse Practitioner

## 2023-08-31 ENCOUNTER — Ambulatory Visit: Payer: BC Managed Care – PPO | Admitting: Nurse Practitioner

## 2023-09-08 ENCOUNTER — Encounter: Payer: BC Managed Care – PPO | Admitting: Internal Medicine

## 2023-09-27 ENCOUNTER — Ambulatory Visit: Payer: Self-pay | Admitting: Gastroenterology

## 2023-09-27 ENCOUNTER — Encounter: Payer: Self-pay | Admitting: Gastroenterology

## 2023-09-27 VITALS — BP 124/72 | HR 60 | Ht 71.0 in | Wt 191.0 lb

## 2023-09-27 DIAGNOSIS — Z1211 Encounter for screening for malignant neoplasm of colon: Secondary | ICD-10-CM

## 2023-09-27 DIAGNOSIS — K611 Rectal abscess: Secondary | ICD-10-CM | POA: Diagnosis not present

## 2023-09-27 DIAGNOSIS — K649 Unspecified hemorrhoids: Secondary | ICD-10-CM | POA: Diagnosis not present

## 2023-09-27 MED ORDER — SUFLAVE 178.7 G PO SOLR: 1.0000 | Freq: Once | ORAL | 0 refills | Status: AC

## 2023-09-27 MED ORDER — CALMOL-4 76-10 % RE SUPP: RECTAL | Status: DC

## 2023-09-27 NOTE — Progress Notes (Signed)
 HPI :  63 year old male with a history of perirectal abscess, hemorrhoids, remote anal fissure, cardiomyopathy, nonobstructive CAD, here to establish care and discuss colonoscopy.  Referred by Dr. Marin Olp.  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  He has not had an issue with that since then.  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  He typically will use Preparation H as needed for hemorrhoids.  For the first time, he developed a perirectal abscess in December.  He was treated with antibiotics and eventually it resolved.  Seen by Dr. Cliffton Asters of colorectal surgery.  He did not require any surgical intervention.  He completed his antibiotics and has not had any recurrence of symptoms since that time.  He takes Metamucil daily, states his bowels are regular, no constipation or straining.  Hemorrhoids typically do not bother him unless he is traveling.  No blood in his stools.  He denies any family history of IBD or colon cancer.  He has not had any abdominal pains.  No weight loss.  He denies any upper tract symptoms, no GERD, is eating well.  His last colonoscopy was over 10 years ago when he was in [place] and told it was normal.  He does have a history of CAD, had hospitalization in January 2023 for NSTEMI, had cardiac cath but actually had nonobstructive disease, no high risk lesions, no stenting performed.  He has had a mild cardiomyopathy with normal EF.  Cardiac MRI done in 2023, last echo done in August last year.  He denies any problems with anesthesia in the past, otherwise has been feeling well without complaints lately.  Denies any cardiopulmonary symptoms that bother him    Echo 02/23/23: 1. Left ventricular ejection fraction, by estimation, is 65 to 70%. The  left ventricle has normal function. The left  ventricle has no regional  wall motion abnormalities. There is mild left ventricular hypertrophy.  Left ventricular diastolic parameters  were normal. The average left ventricular global longitudinal strain is  -22.2 %. The global longitudinal strain is normal.   2. Right ventricular systolic function is normal. The right ventricular  size is normal. There is normal pulmonary artery systolic pressure.   3. The mitral valve is normal in structure. Mild mitral valve  regurgitation.   4. The aortic valve is tricuspid. Aortic valve regurgitation is not  visualized. Aortic valve sclerosis is present, with no evidence of aortic  valve stenosis.    Cardiac cath 08/11/21:   Ramus lesion is 20% stenosed.   Prox LAD lesion is 20% stenosed.   Prox RCA lesion is 15% stenosed.   There is hyperdynamic left ventricular systolic function.   The left ventricular ejection fraction is greater than 65% by visual estimate.   Mild coronary calcification with mild nonobstructive CAD.   Hyperdynamic LV function with at least moderate left ventricular hypertrophy.  EF estimate greater than 65%.  LVEDP 14 mmHg.   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 Consider secondary imaging modality (echocardiogram, CTA Aorta Protocol, MRA Aorta Protocol) in one year if clinically indicated.   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 No LGE. Maximal thickness 15 mm. No LVOT Obstruction on this study.    Past Medical History:  Diagnosis Date   Arthritis    CAD in  native artery non obstructive disease 09/01/2019   Hemorrhoids    Hyperlipidemia    LVH (left ventricular hypertrophy) 08/12/2021   Perirectal abscess    Unstable angina (HCC) 08/12/2021     Past Surgical History:  Procedure Laterality Date   ANAL FISSURE REPAIR  2006   HERNIA REPAIR Right 2006   LEFT HEART CATH AND CORONARY ANGIOGRAPHY N/A 08/11/2021   Procedure:  LEFT HEART CATH AND CORONARY ANGIOGRAPHY;  Surgeon: Lennette Bihari, MD;  [facility];  Service: Cardiovascular;  Laterality: N/A;   TOTAL SHOULDER ARTHROPLASTY Right 08/01/2019   Procedure: TOTAL SHOULDER ARTHROPLASTY;  Surgeon: Jones Broom, MD;  Location: [facility];  Service: Orthopedics;  Laterality: Right;   Family History  Problem Relation Age of Onset   Dementia Mother    Cancer Father    Dementia Father    Heart attack Maternal Grandmother    Colon cancer Neg Hx    Stomach cancer Neg Hx    Esophageal cancer Neg Hx    Social History   Tobacco Use   Smoking status: Never   Smokeless tobacco: Never  Vaping Use   Vaping status: Never Used  Substance Use Topics   Alcohol use: Yes    Alcohol/week: 6.0 standard drinks of alcohol    Types: 3 Glasses of wine, 3 Cans of beer per week    Comment: ocassional   Drug use: No   Current Outpatient Medications  Medication Sig Dispense Refill   Ascorbic Acid (VITAMIN C PO) Take by mouth. PRN     B-COMPLEX-C PO Take by mouth. PRN     cephALEXin (KEFLEX) 500 MG capsule Take 1 capsule (500 mg total) by mouth 3 (three) times daily. 21 capsule 0   METAMUCIL FIBER PO Take by mouth.     PEG 3350-KCl-NaCl-NaSulf-MgSul (SUFLAVE) 178.7 g SOLR Take 1 kit by mouth once for 1 dose. 1 each 0   Rectal Protectant-Emollient (CALMOL-4) 76-10 % SUPP Use as directed as needed     rosuvastatin (CRESTOR) 20 MG tablet Take 1 tablet by mouth every other day 45 tablet 2   VITAMIN D PO Take by mouth. PRN     No current facility-administered medications for this visit.   No Known Allergies   Review of Systems: All systems reviewed and negative except where noted in HPI.   Lab Results  Component Value Date   WBC 3.6 (L) 03/24/2023   HGB 14.8 03/24/2023   HCT 43.3 03/24/2023   MCV 91.7 03/24/2023   PLT 194 03/24/2023    Lab Results  Component Value Date   NA 137 03/24/2023   CL 105 03/24/2023   K 4.4 03/24/2023   CO2 24  03/24/2023   BUN 14 03/24/2023   CREATININE 0.75 03/24/2023   EGFR 103 03/24/2023   CALCIUM 10.9 (H) 03/24/2023   ALBUMIN 3.6 08/11/2021   GLUCOSE 81 03/24/2023    Lab Results  Component Value Date   ALT 16 03/24/2023   AST 15 03/24/2023   ALKPHOS 49 08/11/2021   BILITOT 0.8 03/24/2023     Physical Exam: BP 124/72   Pulse 60   Ht 5\' 11"  (1.803 m)   Wt 191 lb (86.6 kg)   BMI 26.64 kg/m  Constitutional: Pleasant,well-developed, male in no acute distress. HEENT: Normocephalic and atraumatic. Conjunctivae are normal. No scleral icterus. Cardiovascular: Normal rate, regular rhythm.  Pulmonary/chest: Effort normal and breath sounds normal.  Abdominal: Soft, nondistended, nontender.  There are no masses palpable. No  hepatomegaly. Extremities: no edema Lymphadenopathy: No cervical adenopathy noted. Neurological: Alert and oriented to person place and time. Skin: Skin is warm and dry. No rashes noted. Psychiatric: Normal mood and affect. Behavior is normal.   ASSESSMENT: 63 y.o. male here for assessment of the following  1. Perirectal abscess   2. Hemorrhoids, unspecified hemorrhoid type   3. Colon cancer screening    First time perirectal abscess this past November.  Treated conservatively with antibiotics and no recurrence.  He has some baseline mild intermittent hemorrhoid symptoms that bother him but no symptoms concerning for fistula, a more chronic basis.  He did have a remote anal fissure surgery years ago and has not had recurrence of that.  He clinically does not have any other symptoms concerning for IBD, discussed the possibility with perirectal abscess although isolated perianal disease is rather rare.  He is due for a screening colonoscopy, given the perirectal abscess recently I think it is a good idea to get that done and make sure no evidence of underlying IBD.  I discussed risks benefits of the procedure and anesthesia and he wants to proceed.  He is doing quite  well from a cardiopulmonary standpoint and is asymptomatic at this time.  He should continue Metamucil daily, avoid straining.  If he needs something for hemorrhoids while traveling, I gave him some samples of Calmol 4 suppositories to use as needed, he can purchase these over-the-counter if needed and can continue Preparation H as well if that helps him.  PLAN: - monitor for recurrence of perirectal abscess. No other clinical evidence of CD.  Call with any recurrence of symptoms, low threshold for empiric antibiotics. - due for colonoscopy, will make sure no luminal inflammation.  To be scheduled at the [facility] - continue metamucil daily - trial of calmol4 suppository PRN - samples given, can get over-the-counter as needed  Harlin Rain, MD [facility]  CC: Andria Meuse, MD

## 2023-09-27 NOTE — Patient Instructions (Addendum)
 You have been scheduled for a Colonoscopy. Please follow written instructions given to you at your visit today.  If you use inhalers (even only as needed), please bring them with you on the day of your procedure. If you need to reschedule or cancel this appointment please call at least a week in advance.  DO NOT TAKE 7 DAYS PRIOR TO TEST-  Trulicity (dulaglutide) Ozempic, Wegovy (semaglutide) Mounjaro (tirzepatide) Bydureon Bcise (exanatide extended release)  DO NOT TAKE 1 DAY PRIOR TO YOUR TEST  Rybelsus (semaglutide) Adlyxin (lixisenatide) Victoza (liraglutide) Byetta (exanatide) ___________________________________________________________________________   Your Provider Has Sent Your Colonoscopy Prep To [facility], which ensures the lowest copay and free home delivery.  They will contact you (via text or call) to confirm you authorize them to process the prescription from your doctor. To set up delivery, you must complete the checkout process via link in a text message or speak to one of the patient care representatives. If [facility] is unable to reach you, your prescription will be delayed.  Please be watching for a call or text from this number: [phone] and remember, you mus confirm the request to get your prep.  [facility] accepts all major insurance benefits and applies discounts & coupons. They will take your copay over the phone and mail you your colonoscopy prep.   Have additional questions?   Chat: [email or web address] (the fastest and easiest way to communicate with them) Call: [phone] Email: [email or web address] NCPDP: [id number]  Texts you receive from [phone] Are NOT Spam.  If you have not heard from them within 2 days after your prescription was ordered, please reach out to them to initiate the order of your prep.  ________________________________________________________________________________  Continue Metamucil.  We have given  you samples of the following medication to take: Calmol 4 suppositories - use as directed  Thank you for entrusting me with your care and for choosing [facility], Dr. Ileene Patrick     If your blood pressure at your visit was 140/90 or greater, please contact your primary care physician to follow up on this. ______________________________________________________  If you are age 101 or older, your body mass index should be between 23-30. Your Body mass index is 26.64 kg/m. If this is out of the aforementioned range listed, please consider follow up with your Primary Care Provider.  If you are age 35 or younger, your body mass index should be between 19-25. Your Body mass index is 26.64 kg/m. If this is out of the aformentioned range listed, please consider follow up with your Primary Care Provider.  ________________________________________________________  The [facility] providers would like to encourage you to use [facility] to communicate with providers for non-urgent requests or questions.  Due to long hold times on the telephone, sending your provider a message by [facility] may be a faster and more efficient way to get a response.  Please allow 48 business hours for a response.  Please remember that this is for non-urgent requests.  _______________________________________________________  Due to recent changes in healthcare laws, you may see the results of your imaging and laboratory studies on MyChart before your provider has had a chance to review them.  We understand that in some cases there may be results that are confusing or concerning to you. Not all laboratory results come back in the same time frame and the provider may be waiting for multiple results in order to interpret others.  Please give [place] 48 hours in order for your provider to thoroughly review  all the results before contacting the office for clarification of your results.

## 2023-10-02 ENCOUNTER — Encounter: Payer: BC Managed Care – PPO | Admitting: Internal Medicine

## 2023-11-17 ENCOUNTER — Telehealth: Payer: Self-pay | Admitting: Gastroenterology

## 2023-11-17 DIAGNOSIS — Z1211 Encounter for screening for malignant neoplasm of colon: Secondary | ICD-10-CM

## 2023-11-17 MED ORDER — NA SULFATE-K SULFATE-MG SULF 17.5-3.13-1.6 GM/177ML PO SOLN: 1.0000 | Freq: Once | ORAL | 0 refills | Status: AC

## 2023-11-17 NOTE — Telephone Encounter (Signed)
 PT needs to have prep instructions and medication for procedure on 5/7. Please advise.

## 2023-11-17 NOTE — Telephone Encounter (Signed)
 Resent instructions to patient's MyChart.  New prep script (Suprep) sent to [facility]. Patient never picked up Suflave  sent in March. Will send Suprep as [facility] says they have it in stock

## 2023-11-21 ENCOUNTER — Encounter: Payer: Self-pay | Admitting: Gastroenterology

## 2023-11-22 ENCOUNTER — Encounter: Payer: Self-pay | Admitting: Gastroenterology

## 2023-11-22 ENCOUNTER — Ambulatory Visit (AMBULATORY_SURGERY_CENTER): Admitting: Gastroenterology

## 2023-11-22 VITALS — BP 111/62 | HR 52 | Temp 98.3°F | Resp 14 | Ht 71.0 in | Wt 191.0 lb

## 2023-11-22 DIAGNOSIS — K6389 Other specified diseases of intestine: Secondary | ICD-10-CM

## 2023-11-22 DIAGNOSIS — K611 Rectal abscess: Secondary | ICD-10-CM

## 2023-11-22 DIAGNOSIS — K5730 Diverticulosis of large intestine without perforation or abscess without bleeding: Secondary | ICD-10-CM

## 2023-11-22 DIAGNOSIS — K6289 Other specified diseases of anus and rectum: Secondary | ICD-10-CM | POA: Diagnosis not present

## 2023-11-22 DIAGNOSIS — K648 Other hemorrhoids: Secondary | ICD-10-CM | POA: Diagnosis not present

## 2023-11-22 DIAGNOSIS — D124 Benign neoplasm of descending colon: Secondary | ICD-10-CM

## 2023-11-22 DIAGNOSIS — Z1211 Encounter for screening for malignant neoplasm of colon: Secondary | ICD-10-CM

## 2023-11-22 DIAGNOSIS — D123 Benign neoplasm of transverse colon: Secondary | ICD-10-CM

## 2023-11-22 MED ORDER — SODIUM CHLORIDE 0.9 % IV SOLN: 500.0000 mL | INTRAVENOUS | Status: DC

## 2023-11-22 NOTE — Progress Notes (Signed)
 [facility] History and Physical   Primary Care Physician:  Vangie Genet, MD   Reason for Procedure:   Colon cancer screening, history of perirectal abscess  Plan:    colonoscopy     HPI: Dave Rogers is a 63 y.o. male  here for colonoscopy screening, history of recent perirectal abscess, ensure no IBD.    No family history of colon cancer known. Otherwise feels well without any cardiopulmonary symptoms.   I have discussed risks / benefits of anesthesia and endoscopic procedure with Catherine Cloud and they wish to proceed with the exams as outlined today.    Past Medical History:  Diagnosis Date   Arthritis    CAD in native artery non obstructive disease 09/01/2019   Hemorrhoids    Hyperlipidemia    LVH (left ventricular hypertrophy) 08/12/2021   Perirectal abscess    Unstable angina (HCC) 08/12/2021    Past Surgical History:  Procedure Laterality Date   ANAL FISSURE REPAIR  2006   HERNIA REPAIR Right 2006   LEFT HEART CATH AND CORONARY ANGIOGRAPHY N/A 08/11/2021   Procedure: LEFT HEART CATH AND CORONARY ANGIOGRAPHY;  Surgeon: Millicent Ally, MD;  Location: [facility];  Service: Cardiovascular;  Laterality: N/A;   TOTAL SHOULDER ARTHROPLASTY Right 08/01/2019   Procedure: TOTAL SHOULDER ARTHROPLASTY;  Surgeon: Sammye Cristal, MD;  Location: [facility];  Service: Orthopedics;  Laterality: Right;    Prior to Admission medications   Medication Sig Start Date End Date Taking? Authorizing Provider  Ascorbic Acid (VITAMIN C PO) Take by mouth. PRN   Yes [provider]  B-COMPLEX-C PO Take by mouth. PRN   Yes [provider]  rosuvastatin  (CRESTOR ) 20 MG tablet Take 1 tablet by mouth every other day 06/20/23  Yes Croitoru, Mihai, MD  VITAMIN D  PO Take by mouth. PRN   Yes [provider]  METAMUCIL FIBER PO Take by mouth.    [provider]  Rectal Protectant-Emollient (CALMOL-4) 76-10 % SUPP Use as directed as needed  09/27/23   Katana Berthold, Lendon Queen, MD    Current Outpatient Medications  Medication Sig Dispense Refill   Ascorbic Acid (VITAMIN C PO) Take by mouth. PRN     B-COMPLEX-C PO Take by mouth. PRN     rosuvastatin  (CRESTOR ) 20 MG tablet Take 1 tablet by mouth every other day 45 tablet 2   VITAMIN D  PO Take by mouth. PRN     METAMUCIL FIBER PO Take by mouth.     Rectal Protectant-Emollient (CALMOL-4) 76-10 % SUPP Use as directed as needed     Current Facility-Administered Medications  Medication Dose Route Frequency Provider Last Rate Last Admin   0.9 %  sodium chloride  infusion  500 mL Intravenous Continuous Deana Krock, Lendon Queen, MD        Allergies as of 11/22/2023   (No Known Allergies)    Family History  Problem Relation Age of Onset   Dementia Mother    Cancer Father    Dementia Father    Heart attack Maternal Grandmother    Colon cancer Neg Hx    Stomach cancer Neg Hx    Esophageal cancer Neg Hx     Social History   Socioeconomic History   Marital status: Married    Spouse name: Not on file   Number of children: 1   Years of education: Not on file   Highest education level: Not on file  Occupational History   Occupation: furniture business  Tobacco Use  Smoking status: Never   Smokeless tobacco: Never  Vaping Use   Vaping status: Never Used  Substance and Sexual Activity   Alcohol use: Yes    Alcohol/week: 6.0 standard drinks of alcohol    Types: 3 Glasses of wine, 3 Cans of beer per week    Comment: ocassional   Drug use: No   Sexual activity: Yes  Other Topics Concern   Not on file  Social History Narrative   Not on file   Social Drivers of Health   Financial Resource Strain: Not on file  Food Insecurity: Not on file  Transportation Needs: Not on file  Physical Activity: Unknown (08/29/2017)   Exercise Vital Sign    Days of Exercise per Week: 3 days    Minutes of Exercise per Session: Not on file  Stress: Not on file  Social Connections: Not on file   Intimate Partner Violence: Not on file    Review of Systems: All other review of systems negative except as mentioned in the HPI.  Physical Exam: Vital signs BP 131/69   Pulse (!) 55   Temp 98.3 F (36.8 C)   Ht 5\' 11"  (1.803 m)   Wt 191 lb (86.6 kg)   SpO2 99%   BMI 26.64 kg/m   General:   Alert,  Well-developed, pleasant and cooperative in NAD Lungs:  Clear throughout to auscultation.   Heart:  Regular rate and rhythm Abdomen:  Soft, nontender and nondistended.   Neuro/Psych:  Alert and cooperative. Normal mood and affect. A and O x 3  Christi Coward, MD [facility]

## 2023-11-22 NOTE — Progress Notes (Signed)
 A/o x 3, VSS, gd SR's, pleased with anesthesia, report to RN

## 2023-11-22 NOTE — Progress Notes (Signed)
 Called to room to assist during endoscopic procedure.  Patient ID and intended procedure confirmed with present staff. Received instructions for my participation in the procedure from the performing physician.

## 2023-11-22 NOTE — Patient Instructions (Signed)

## 2023-11-22 NOTE — Op Note (Signed)
 [facility] Patient Name: Dave Rogers Procedure Date: 11/22/2023 1:32 PM MRN: [id number] Endoscopist: Landon Pinion P. General Kenner , MD, [id number] Age: 63 Referring MD:  Date of Birth: Apr 05, 1961 Gender: Male Account #: [id number] Procedure:                Colonoscopy Indications:              Screening for colorectal malignant neoplasm -                            incidental, history of perirectal abscess, ensure                            no IBD Medicines:                Monitored Anesthesia Care Procedure:                Pre-Anesthesia Assessment:                           - Prior to the procedure, a History and Physical                            was performed, and patient medications and                            allergies were reviewed. The patient's tolerance of                            previous anesthesia was also reviewed. The risks                            and benefits of the procedure and the sedation                            options and risks were discussed with the patient.                            All questions were answered, and informed consent                            was obtained. Prior Anticoagulants: The patient has                            taken no anticoagulant or antiplatelet agents. ASA                            Grade Assessment: II - A patient with mild systemic                            disease. After reviewing the risks and benefits,                            the patient was deemed in satisfactory condition to  undergo the procedure.                           After obtaining informed consent, the colonoscope                            was passed under direct vision. Throughout the                            procedure, the patient's blood pressure, pulse, and                            oxygen saturations were monitored continuously. The                            Olympus Scope SN: [id number] was introduced through                             the anus and advanced to the the terminal ileum,                            with identification of the appendiceal orifice and                            IC valve. The colonoscopy was performed without                            difficulty. The patient tolerated the procedure                            well. The quality of the bowel preparation was                            good. The terminal ileum, ileocecal valve,                            appendiceal orifice, and rectum were photographed. Scope In: 1:36:35 PM Scope Out: 2:02:29 PM Scope Withdrawal Time: 0 hours 20 minutes 43 seconds  Total Procedure Duration: 0 hours 25 minutes 54 seconds  Findings:                 The perianal and digital rectal examinations were                            normal.                           The terminal ileum appeared normal.                           Multiple diverticula were found in the left colon                            and right colon.  A diminutive polyp was found in the transverse                            colon. The polyp was sessile. The polyp was removed                            with a cold snare. Resection and retrieval were                            complete.                           A 3 mm polyp was found in the descending colon. The                            polyp was sessile. The polyp was removed with a                            cold snare. Resection and retrieval were complete.                           Anal papilla(e) were hypertrophied. Biopsies were                            taken with a cold forceps for histology - rule out                            AIN.                           Internal hemorrhoids were found during retroflexion.                           The exam was otherwise without abnormality. Complications:            No immediate complications. Estimated blood loss:                             Minimal. Estimated Blood Loss:     Estimated blood loss was minimal. Impression:               - The examined portion of the ileum was normal.                           - Diverticulosis in the left colon and in the right                            colon.                           - One diminutive polyp in the transverse colon,                            removed with a cold snare. Resected and retrieved.                           -  One 3 mm polyp in the descending colon, removed                            with a cold snare. Resected and retrieved.                           - Anal papilla(e) were hypertrophied. Biopsied.                           - Internal hemorrhoids.                           - The examination was otherwise normal. No                            inflammatory changes. Recommendation:           - Patient has a contact number available for                            emergencies. The signs and symptoms of potential                            delayed complications were discussed with the                            patient. Return to normal activities tomorrow.                            Written discharge instructions were provided to the                            patient.                           - Resume previous diet.                           - Continue present medications.                           - Await pathology results. Landon Pinion P. Nalia Honeycutt, MD 11/22/2023 2:08:03 PM This report has been signed electronically.

## 2023-11-23 ENCOUNTER — Telehealth: Payer: Self-pay

## 2023-11-23 NOTE — Telephone Encounter (Signed)
 Post procedure follow up call, no answer

## 2023-11-27 LAB — SURGICAL PATHOLOGY

## 2023-11-29 ENCOUNTER — Ambulatory Visit: Payer: Self-pay | Admitting: Gastroenterology

## 2023-12-25 NOTE — Progress Notes (Signed)
 Sent message, via epic in basket, requesting orders in epic from Careers adviser.

## 2023-12-27 ENCOUNTER — Ambulatory Visit: Payer: Self-pay | Admitting: Surgery

## 2023-12-29 NOTE — Patient Instructions (Signed)
 SURGICAL WAITING ROOM VISITATION  Patients having surgery or a procedure may have no more than 2 support people in the waiting area - these visitors may rotate.    Children under the age of 59 must have an adult with them who is not the patient.  Visitors with respiratory illnesses are discouraged from visiting and should remain at home.  If the patient needs to stay at the hospital during part of their recovery, the visitor guidelines for inpatient rooms apply. Pre-op nurse will coordinate an appropriate time for 1 support person to accompany patient in pre-op.  This support person may not rotate.    Please refer to the [facility] website for the visitor guidelines for Inpatients (after your surgery is over and you are in a regular room).    Your procedure is scheduled on: 01/15/24   Report to [facility] Main Entrance    Report to admitting at 12:00 PM   Call this number if you have problems the morning of surgery [phone]   Do not eat food or drink liquids :After Midnight.          If you have questions, please contact your surgeon's office.   FOLLOW BOWEL PREP AND ANY ADDITIONAL PRE OP INSTRUCTIONS YOU RECEIVED FROM YOUR SURGEON'S OFFICE!!!     Oral Hygiene is also important to reduce your risk of infection.                                    Remember - BRUSH YOUR TEETH THE MORNING OF SURGERY WITH YOUR REGULAR TOOTHPASTE  DENTURES WILL BE REMOVED PRIOR TO SURGERY PLEASE DO NOT APPLY Poly grip OR ADHESIVES!!!   Stop all vitamins and herbal supplements 7 days before surgery.   Take these medicines the morning of surgery with A SIP OF WATER : None                               You may not have any metal on your body including hair pins, jewelry, and body piercing             Do not wear  lotions, powders, cologne, or deodorant              Men may shave face and neck.   Do not bring valuables to the hospital. [facility] IS NOT             RESPONSIBLE   FOR  VALUABLES.   Contacts, glasses, dentures or bridgework may not be worn into surgery.  DO NOT BRING YOUR HOME MEDICATIONS TO THE HOSPITAL. PHARMACY WILL DISPENSE MEDICATIONS LISTED ON YOUR MEDICATION LIST TO YOU DURING YOUR ADMISSION IN THE HOSPITAL!    Patients discharged on the day of surgery will not be allowed to drive home.  Someone NEEDS to stay with you for the first 24 hours after anesthesia.   Special Instructions: Bring a copy of your healthcare power of attorney and living will documents the day of surgery if you haven't scanned them before.              Please read over the following fact sheets you were given: IF YOU HAVE QUESTIONS ABOUT YOUR PRE-OP INSTRUCTIONS PLEASE CALL [name]   If you received a COVID test during your pre-op visit  it is requested that you wear a mask when out  in public, stay away from anyone that may not be feeling well and notify your surgeon if you develop symptoms. If you test positive for Covid or have been in contact with anyone that has tested positive in the last 10 days please notify you surgeon.    [facility] - Preparing for Surgery Before surgery, you can play an important role.  Because skin is not sterile, your skin needs to be as free of germs as possible.  You can reduce the number of germs on your skin by washing with CHG (chlorahexidine gluconate) soap before surgery.  CHG is an antiseptic cleaner which kills germs and bonds with the skin to continue killing germs even after washing. Please DO NOT use if you have an allergy to CHG or antibacterial soaps.  If your skin becomes reddened/irritated stop using the CHG and inform your nurse when you arrive at Short Stay. Do not shave (including legs and underarms) for at least 48 hours prior to the first CHG shower.  You may shave your face/neck.  Please follow these instructions carefully:  1.  Shower with CHG Soap the night before surgery and the  morning of surgery.  2.  If you  choose to wash your hair, wash your hair first as usual with your normal  shampoo.  3.  After you shampoo, rinse your hair and body thoroughly to remove the shampoo.                             4.  Use CHG as you would any other liquid soap.  You can apply chg directly to the skin and wash.  Gently with a scrungie or clean washcloth.  5.  Apply the CHG Soap to your body ONLY FROM THE NECK DOWN.   Do   not use on face/ open                           Wound or open sores. Avoid contact with eyes, ears mouth and   genitals (private parts).                       Wash face,  Genitals (private parts) with your normal soap.             6.  Wash thoroughly, paying special attention to the area where your    surgery  will be performed.  7.  Thoroughly rinse your body with warm water  from the neck down.  8.  DO NOT shower/wash with your normal soap after using and rinsing off the CHG Soap.                9.  Pat yourself dry with a clean towel.            10.  Wear clean pajamas.            11.  Place clean sheets on your bed the night of your first shower and do not  sleep with pets. Day of Surgery : Do not apply any lotions/deodorants the morning of surgery.  Please wear clean clothes to the hospital/surgery center.  FAILURE TO FOLLOW THESE INSTRUCTIONS MAY RESULT IN THE CANCELLATION OF YOUR SURGERY  PATIENT SIGNATURE_________________________________  NURSE SIGNATURE__________________________________  ________________________________________________________________________

## 2023-12-29 NOTE — Progress Notes (Signed)
 Patient forgot about appointment today. Completed PAT over the phone. Patient coming in for labs and instructions 01/02/24 at 1430.  Patient states he only wants information given to him. Does not want info given to wife.   COVID Vaccine Completed:yes   Date of COVID positive in last 90 days:  PCP - Vangie Genet, MD- Need new PCP Cardiologist - Arnoldo Lapping, MD LOV 01/23/23  Chest x-ray - n/a EKG - 01/23/23 Epic Stress Test - 12/02/21 Epic ECHO - 02/23/23 Epic Cardiac Cath - 08/11/21 Epic Pacemaker/ICD device last checked: n/a Spinal Cord Stimulator:n/a  Bowel Prep - no  Sleep Study - n/a CPAP -   Fasting Blood Sugar - n/a Checks Blood Sugar _____ times a day  Last dose of GLP1 agonist-  N/A GLP1 instructions:  Hold 7 days before surgery    Last dose of SGLT-2 inhibitors-  N/A SGLT-2 instructions:  Hold 3 days before surgery    Blood Thinner Instructions:  Last dose:  n/a  Time: Aspirin  Instructions: Last Dose:  Activity level: Can go up a flight of stairs and perform activities of daily living without stopping and without symptoms of chest pain or shortness of breath.  Anesthesia review: CAD, aortic atherosclerosis, NSTEMI, LVH  Patient denies shortness of breath, fever, cough and chest pain at PAT appointment  Patient verbalized understanding of instructions that were given to them at the PAT appointment. Patient was also instructed that they will need to review over the PAT instructions again at home before surgery.

## 2024-01-01 ENCOUNTER — Other Ambulatory Visit: Payer: Self-pay

## 2024-01-01 ENCOUNTER — Encounter (HOSPITAL_COMMUNITY): Payer: Self-pay

## 2024-01-01 ENCOUNTER — Encounter (HOSPITAL_COMMUNITY)
Admission: RE | Admit: 2024-01-01 | Discharge: 2024-01-01 | Disposition: A | Discharge status: Home / Self Care | Source: Ambulatory Visit | Attending: Surgery | Admitting: Surgery

## 2024-01-01 VITALS — Ht 71.0 in | Wt 185.0 lb

## 2024-01-01 DIAGNOSIS — Z01818 Encounter for other preprocedural examination: Secondary | ICD-10-CM | POA: Diagnosis present

## 2024-01-01 DIAGNOSIS — K6289 Other specified diseases of anus and rectum: Secondary | ICD-10-CM | POA: Diagnosis not present

## 2024-01-01 DIAGNOSIS — E785 Hyperlipidemia, unspecified: Secondary | ICD-10-CM | POA: Diagnosis not present

## 2024-01-01 DIAGNOSIS — D72819 Decreased white blood cell count, unspecified: Secondary | ICD-10-CM | POA: Insufficient documentation

## 2024-01-01 DIAGNOSIS — I2510 Atherosclerotic heart disease of native coronary artery without angina pectoris: Secondary | ICD-10-CM | POA: Insufficient documentation

## 2024-01-01 DIAGNOSIS — I214 Non-ST elevation (NSTEMI) myocardial infarction: Secondary | ICD-10-CM

## 2024-01-02 ENCOUNTER — Encounter (HOSPITAL_COMMUNITY)
Admission: RE | Admit: 2024-01-02 | Discharge: 2024-01-02 | Disposition: A | Discharge status: Home / Self Care | Source: Ambulatory Visit | Attending: Surgery | Admitting: Surgery

## 2024-01-02 DIAGNOSIS — Z01812 Encounter for preprocedural laboratory examination: Secondary | ICD-10-CM | POA: Diagnosis present

## 2024-01-02 DIAGNOSIS — I214 Non-ST elevation (NSTEMI) myocardial infarction: Secondary | ICD-10-CM | POA: Diagnosis not present

## 2024-01-02 LAB — BASIC METABOLIC PANEL WITH GFR
Anion gap: 7 (ref 5–15)
BUN: 14 mg/dL (ref 6–20)
CO2: 25 mmol/L (ref 22–32)
Calcium: 10.1 mg/dL (ref 8.9–10.3)
Chloride: 107 mmol/L (ref 98–111)
Creatinine, Ser: 0.81 mg/dL (ref 0.61–1.24)
GFR, Estimated: >60 mL/min (ref >60)
Glucose, Bld: 110 mg/dL — ABNORMAL HIGH (ref 70–99)
Potassium: 4 mmol/L (ref 3.5–5.1)
Sodium: 139 mmol/L (ref 135–145)

## 2024-01-02 LAB — CBC
HCT: 42.4 % (ref 39.0–52.0)
Hemoglobin: 14.3 g/dL (ref 13.0–17.0)
MCH: 31.7 pg (ref 26.0–34.0)
MCHC: 33.7 g/dL (ref 30.0–36.0)
MCV: 94 fL (ref 80.0–100.0)
Platelets: 178 10*3/uL (ref 150–400)
RBC: 4.51 MIL/uL (ref 4.22–5.81)
RDW: 13.2 % (ref 11.5–15.5)
WBC: 3.9 10*3/uL — ABNORMAL LOW (ref 4.0–10.5)
nRBC: 0 % (ref 0.0–0.2)

## 2024-01-03 ENCOUNTER — Encounter (HOSPITAL_COMMUNITY): Payer: Self-pay

## 2024-01-03 NOTE — Anesthesia Preprocedure Evaluation (Addendum)
 Anesthesia Evaluation  Patient identified by MRN, date of birth, ID band Patient awake    Reviewed: Allergy & Precautions, NPO status , Patient's Chart, lab work & pertinent test results  Airway Mallampati: II  TM Distance: <3 FB Neck ROM: Full    Dental  (+) Teeth Intact, Dental Advisory Given   Pulmonary neg pulmonary ROS   breath sounds clear to auscultation       Cardiovascular + angina  + CAD and + Past MI   Rhythm:Regular Rate:Normal  Echo:  1. Left ventricular ejection fraction, by estimation, is 65 to 70%. The  left ventricle has normal function. The left ventricle has no regional  wall motion abnormalities. There is mild left ventricular hypertrophy.  Left ventricular diastolic parameters  were normal. The average left ventricular global longitudinal strain is  -22.2 %. The global longitudinal strain is normal.   2. Right ventricular systolic function is normal. The right ventricular  size is normal. There is normal pulmonary artery systolic pressure.   3. The mitral valve is normal in structure. Mild mitral valve  regurgitation.   4. The aortic valve is tricuspid. Aortic valve regurgitation is not  visualized. Aortic valve sclerosis is present, with no evidence of aortic  valve stenosis.     Neuro/Psych negative neurological ROS  negative psych ROS   GI/Hepatic negative GI ROS, Neg liver ROS,,,  Endo/Other  negative endocrine ROS    Renal/GU negative Renal ROS     Musculoskeletal  (+) Arthritis ,    Abdominal   Peds  Hematology negative hematology ROS (+)   Anesthesia Other Findings   Reproductive/Obstetrics                             Anesthesia Physical Anesthesia Plan  ASA: 3  Anesthesia Plan: General   Post-op Pain Management: Tylenol  PO (pre-op)*   Induction: Intravenous  PONV Risk Score and Plan: 3 and Ondansetron , Dexamethasone  and Midazolam   Airway  Management Planned: Oral ETT  Additional Equipment: None  Intra-op Plan:   Post-operative Plan: Extubation in OR  Informed Consent: I have reviewed the patients History and Physical, chart, labs and discussed the procedure including the risks, benefits and alternatives for the proposed anesthesia with the patient or authorized representative who has indicated his/her understanding and acceptance.     Dental advisory given  Plan Discussed with: CRNA  Anesthesia Plan Comments: (See PAT note from 6/16)        Anesthesia Quick Evaluation

## 2024-01-03 NOTE — Progress Notes (Signed)
 Case: [id number] Date/Time: 01/15/24 1400   Procedure: EXCISION, MASS, RECTUM, ANAL APPROACH - TRANSANAL EXCISION OF DISTAL RECTAL LESION   Anesthesia type: General   Diagnosis: Hypertrophied anal papilla [K62.89]   Pre-op diagnosis: DISTAL RECTAL LESION   Location: [facility] ROOM 06 / [facility]   Surgeons: Melvenia Stabs, MD       DISCUSSION: Dave Rogers is a 63 yo male who presents to PAT prior to surgery above. PMH of nonobstructive CAD (by cath), HCM, HLD, perirectal abscess.  Patient was admitted in 2023 for possible unstable angina with abnormal EKG. He underwent cardiac cath which showed mild nonobstructive CAD. It was thought microvascular angina could be the cause of his chest discomfort. He was last seen in clinic on 01/23/23 and he reported an episode of chest pain while playing golf but no recurrent pain or other symptoms. EKG was obtained which did not show anything acute. An updated echo was recommended which was stable. Advised to f/u in 1 year.  VS: Ht 5' 11 (1.803 m)   Wt 83.9 kg   BMI 25.80 kg/m   PROVIDERS: Vangie Genet, MD   LABS: Labs reviewed: Acceptable for surgery. Mild chronic leukopenia noted with low abs lymphocytes. Pt was supposed to have HIV testing done. Consider testing DOS.   EKG 01/23/23:  Sinus bradycardia, rate 52 ST & T wave abnormality, consider inferolateral ischemia When compared with ECG of 12-Aug-2021 07:00, T wave inversion less evident in Anterolateral leads QT has shortened  CV:  Echo 02/23/23:  IMPRESSIONS    1. Left ventricular ejection fraction, by estimation, is 65 to 70%. The left ventricle has normal function. The left ventricle has no regional wall motion abnormalities. There is mild left ventricular hypertrophy. Left ventricular diastolic parameters were normal. The average left ventricular global longitudinal strain is -22.2 %. The global longitudinal strain is normal.  2. Right ventricular systolic function is  normal. The right ventricular size is normal. There is normal pulmonary artery systolic pressure.  3. The mitral valve is normal in structure. Mild mitral valve regurgitation.  4. The aortic valve is tricuspid. Aortic valve regurgitation is not visualized. Aortic valve sclerosis is present, with no evidence of aortic valve stenosis.  Cardiac monitor 05/25/2022:    Patient had a minimum heart rate of 40 bpm, maximum heart rate of 120 bpm, and average heart rate of 61 bpm.   Predominant underlying rhythm was sinus rhythm.   Two short runs of SVT lasting 7 beats at longest.   Isolated PACs were rare (<1.0%).   Isolated PVCs were rare (<1.0%).   No triggered and diary events.   No A fib or NSVT in the setting of HCM.   Cardiac MRI 12/30/2021:  IMPRESSION: Evidence of mild ascending aortic dilation 42 mm. Consider secondary imaging modality (echocardiogram, CTA Aorta Protocol, MRA Aorta Protocol) in one year if clinically indicated.   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 No LGE. Maximal thickness 15 mm. No LVOT Obstruction on this study.  Past Medical History:  Diagnosis Date   Arthritis    CAD in native artery non obstructive disease 09/01/2019   Hemorrhoids    Hyperlipidemia    LVH (left ventricular hypertrophy) 08/12/2021   Perirectal abscess    Unstable angina (HCC) 08/12/2021    Past Surgical History:  Procedure Laterality Date   ANAL FISSURE REPAIR  2006   HERNIA REPAIR Right 2006   LEFT HEART CATH AND CORONARY ANGIOGRAPHY N/A 08/11/2021   Procedure:  LEFT HEART CATH AND CORONARY ANGIOGRAPHY;  Surgeon: Millicent Ally, MD;  Location: [facility] INVASIVE CV LAB;  Service: Cardiovascular;  Laterality: N/A;   TOTAL SHOULDER ARTHROPLASTY Right 08/01/2019   Procedure: TOTAL SHOULDER ARTHROPLASTY;  Surgeon: Sammye Cristal, MD;  Location: [facility];  Service: Orthopedics;  Laterality: Right;    MEDICATIONS:  Ascorbic Acid (VITAMIN C PO)    B-COMPLEX-C PO   METAMUCIL FIBER PO   rosuvastatin  (CRESTOR ) 20 MG tablet   VITAMIN D  PO   No current facility-administered medications for this encounter.   [name] [facility] Short Stay/Anesthesiology [facility] Phone [phone] 01/03/2024 1:03 PM

## 2024-01-15 ENCOUNTER — Ambulatory Visit (HOSPITAL_COMMUNITY)
Admission: RE | Admit: 2024-01-15 | Discharge: 2024-01-15 | Disposition: A | Discharge status: Home / Self Care | Source: Ambulatory Visit | Attending: Surgery | Admitting: Surgery

## 2024-01-15 ENCOUNTER — Encounter (HOSPITAL_COMMUNITY)
Admission: RE | Disposition: A | Discharge status: Home / Self Care | Payer: Self-pay | Source: Ambulatory Visit | Attending: Surgery

## 2024-01-15 ENCOUNTER — Other Ambulatory Visit: Payer: Self-pay

## 2024-01-15 ENCOUNTER — Ambulatory Visit (HOSPITAL_BASED_OUTPATIENT_CLINIC_OR_DEPARTMENT_OTHER): Payer: Self-pay | Admitting: Anesthesiology

## 2024-01-15 ENCOUNTER — Encounter (HOSPITAL_COMMUNITY): Payer: Self-pay | Admitting: Surgery

## 2024-01-15 ENCOUNTER — Ambulatory Visit (HOSPITAL_COMMUNITY): Payer: Self-pay | Admitting: Medical

## 2024-01-15 DIAGNOSIS — K6289 Other specified diseases of anus and rectum: Secondary | ICD-10-CM | POA: Diagnosis present

## 2024-01-15 DIAGNOSIS — I252 Old myocardial infarction: Secondary | ICD-10-CM

## 2024-01-15 DIAGNOSIS — I25119 Atherosclerotic heart disease of native coronary artery with unspecified angina pectoris: Secondary | ICD-10-CM | POA: Insufficient documentation

## 2024-01-15 DIAGNOSIS — M1990 Unspecified osteoarthritis, unspecified site: Secondary | ICD-10-CM | POA: Diagnosis not present

## 2024-01-15 DIAGNOSIS — I2510 Atherosclerotic heart disease of native coronary artery without angina pectoris: Secondary | ICD-10-CM

## 2024-01-15 DIAGNOSIS — K629 Disease of anus and rectum, unspecified: Secondary | ICD-10-CM

## 2024-01-15 DIAGNOSIS — I080 Rheumatic disorders of both mitral and aortic valves: Secondary | ICD-10-CM | POA: Insufficient documentation

## 2024-01-15 DIAGNOSIS — E785 Hyperlipidemia, unspecified: Secondary | ICD-10-CM | POA: Diagnosis not present

## 2024-01-15 DIAGNOSIS — I700 Atherosclerosis of aorta: Secondary | ICD-10-CM | POA: Diagnosis not present

## 2024-01-15 DIAGNOSIS — K6282 Dysplasia of anus: Secondary | ICD-10-CM | POA: Insufficient documentation

## 2024-01-15 HISTORY — PX: TRANSANAL EXCISION OF RECTAL MASS: SHX6134

## 2024-01-15 SURGERY — EXCISION, MASS, RECTUM, ANAL APPROACH
Anesthesia: General

## 2024-01-15 MED ORDER — PROPOFOL 10 MG/ML IV BOLUS
INTRAVENOUS | Status: AC
  Filled 2024-01-15: qty 20

## 2024-01-15 MED ORDER — ORAL CARE MOUTH RINSE: 15.0000 mL | Freq: Once | OROMUCOSAL | Status: AC

## 2024-01-15 MED ORDER — BUPIVACAINE-EPINEPHRINE (PF) 0.25% -1:200000 IJ SOLN
INTRAMUSCULAR | Status: DC | PRN
  Administered 2024-01-15: 40 mL

## 2024-01-15 MED ORDER — ROCURONIUM BROMIDE 10 MG/ML (PF) SYRINGE
PREFILLED_SYRINGE | INTRAVENOUS | Status: DC | PRN
  Administered 2024-01-15: 50 mg via INTRAVENOUS

## 2024-01-15 MED ORDER — ACETAMINOPHEN 10 MG/ML IV SOLN: 1000.0000 mg | Freq: Once | INTRAVENOUS | Status: DC | PRN

## 2024-01-15 MED ORDER — FENTANYL CITRATE (PF) 100 MCG/2ML IJ SOLN
INTRAMUSCULAR | Status: AC
  Filled 2024-01-15: qty 2

## 2024-01-15 MED ORDER — LACTATED RINGERS IV SOLN: INTRAVENOUS | Status: DC

## 2024-01-15 MED ORDER — OXYCODONE HCL 5 MG PO TABS: 5.0000 mg | ORAL_TABLET | Freq: Once | ORAL | Status: DC | PRN

## 2024-01-15 MED ORDER — ONDANSETRON HCL 4 MG/2ML IJ SOLN
INTRAMUSCULAR | Status: DC | PRN
  Administered 2024-01-15: 4 mg via INTRAVENOUS

## 2024-01-15 MED ORDER — CHLORHEXIDINE GLUCONATE 0.12 % MT SOLN
15.0000 mL | Freq: Once | OROMUCOSAL | Status: AC
  Administered 2024-01-15: 15 mL via OROMUCOSAL

## 2024-01-15 MED ORDER — OXYCODONE HCL 5 MG/5ML PO SOLN: 5.0000 mg | Freq: Once | ORAL | Status: DC | PRN

## 2024-01-15 MED ORDER — FLEET ENEMA RE ENEM
1.0000 | ENEMA | Freq: Once | RECTAL | Status: DC
  Filled 2024-01-15: qty 1

## 2024-01-15 MED ORDER — BUPIVACAINE-EPINEPHRINE (PF) 0.25% -1:200000 IJ SOLN
INTRAMUSCULAR | Status: AC
  Filled 2024-01-15: qty 30

## 2024-01-15 MED ORDER — DIBUCAINE 1 % EX OINT
TOPICAL_OINTMENT | CUTANEOUS | Status: DC | PRN
  Administered 2024-01-15: 1

## 2024-01-15 MED ORDER — STERILE WATER FOR IRRIGATION IR SOLN
Status: DC | PRN
  Administered 2024-01-15: 1000 mL

## 2024-01-15 MED ORDER — BUPIVACAINE LIPOSOME 1.3 % IJ SUSP: 20.0000 mL | Freq: Once | INTRAMUSCULAR | Status: DC

## 2024-01-15 MED ORDER — TRAMADOL HCL 50 MG PO TABS: 50.0000 mg | ORAL_TABLET | Freq: Four times a day (QID) | ORAL | 0 refills | Status: AC | PRN

## 2024-01-15 MED ORDER — 0.9 % SODIUM CHLORIDE (POUR BTL) OPTIME
TOPICAL | Status: DC | PRN
  Administered 2024-01-15: 1000 mL

## 2024-01-15 MED ORDER — ACETAMINOPHEN 500 MG PO TABS: 1000.0000 mg | ORAL_TABLET | ORAL | Status: AC

## 2024-01-15 MED ORDER — ROCURONIUM BROMIDE 10 MG/ML (PF) SYRINGE
PREFILLED_SYRINGE | INTRAVENOUS | Status: AC
  Filled 2024-01-15: qty 10

## 2024-01-15 MED ORDER — DIBUCAINE (PERIANAL) 1 % EX OINT
TOPICAL_OINTMENT | CUTANEOUS | Status: AC
  Filled 2024-01-15: qty 28

## 2024-01-15 MED ORDER — DEXAMETHASONE SODIUM PHOSPHATE 10 MG/ML IJ SOLN
INTRAMUSCULAR | Status: DC | PRN
  Administered 2024-01-15: 10 mg via INTRAVENOUS

## 2024-01-15 MED ORDER — FENTANYL CITRATE PF 50 MCG/ML IJ SOSY: 25.0000 ug | PREFILLED_SYRINGE | INTRAMUSCULAR | Status: DC | PRN

## 2024-01-15 MED ORDER — DROPERIDOL 2.5 MG/ML IJ SOLN: 0.6250 mg | Freq: Once | INTRAMUSCULAR | Status: DC | PRN

## 2024-01-15 MED ORDER — FENTANYL CITRATE (PF) 100 MCG/2ML IJ SOLN
INTRAMUSCULAR | Status: DC | PRN
  Administered 2024-01-15: 50 ug via INTRAVENOUS

## 2024-01-15 MED ORDER — BUPIVACAINE LIPOSOME 1.3 % IJ SUSP
INTRAMUSCULAR | Status: AC
  Filled 2024-01-15: qty 20

## 2024-01-15 MED ORDER — SODIUM CHLORIDE 0.9 % IV SOLN
INTRAVENOUS | Status: AC
  Filled 2024-01-15: qty 2

## 2024-01-15 MED ORDER — SUGAMMADEX SODIUM 200 MG/2ML IV SOLN
INTRAVENOUS | Status: DC | PRN
  Administered 2024-01-15: 200 mg via INTRAVENOUS

## 2024-01-15 MED ORDER — DEXAMETHASONE SODIUM PHOSPHATE 10 MG/ML IJ SOLN
INTRAMUSCULAR | Status: AC
  Filled 2024-01-15: qty 1

## 2024-01-15 MED ORDER — MIDAZOLAM HCL 2 MG/2ML IJ SOLN
INTRAMUSCULAR | Status: DC | PRN
  Administered 2024-01-15: 2 mg via INTRAVENOUS

## 2024-01-15 MED ORDER — MIDAZOLAM HCL 2 MG/2ML IJ SOLN
INTRAMUSCULAR | Status: AC
  Filled 2024-01-15: qty 2

## 2024-01-15 MED ORDER — LIDOCAINE HCL (CARDIAC) PF 100 MG/5ML IV SOSY
PREFILLED_SYRINGE | INTRAVENOUS | Status: DC | PRN
  Administered 2024-01-15: 40 mg via INTRAVENOUS

## 2024-01-15 MED ORDER — SODIUM CHLORIDE 0.9 % IV SOLN
2.0000 g | INTRAVENOUS | Status: AC
  Administered 2024-01-15: 2 g via INTRAVENOUS

## 2024-01-15 MED ORDER — LIDOCAINE HCL (PF) 2 % IJ SOLN
INTRAMUSCULAR | Status: AC
  Filled 2024-01-15: qty 5

## 2024-01-15 MED ORDER — ONDANSETRON HCL 4 MG/2ML IJ SOLN
INTRAMUSCULAR | Status: AC
  Filled 2024-01-15: qty 2

## 2024-01-15 MED ORDER — PROPOFOL 10 MG/ML IV BOLUS
INTRAVENOUS | Status: DC | PRN
  Administered 2024-01-15: 150 mg via INTRAVENOUS

## 2024-01-15 MED ORDER — ACETAMINOPHEN 500 MG PO TABS
ORAL_TABLET | ORAL | Status: AC
  Administered 2024-01-15: 1000 mg via ORAL
  Filled 2024-01-15: qty 2

## 2024-01-15 SURGICAL SUPPLY — 35 items
BAG COUNTER SPONGE SURGICOUNT (BAG) IMPLANT
BENZOIN TINCTURE PRP APPL 2/3 (GAUZE/BANDAGES/DRESSINGS) IMPLANT
BLADE SURG 15 STRL LF DISP TIS (BLADE) IMPLANT
BRIEF MESH DISP LRG (UNDERPADS AND DIAPERS) ×1 IMPLANT
COVER SURGICAL LIGHT HANDLE (MISCELLANEOUS) ×1 IMPLANT
DISSECTOR SURG LIGASURE 21 (MISCELLANEOUS) IMPLANT
DRAPE LAPAROTOMY T 102X78X121 (DRAPES) ×1 IMPLANT
ELECT NDL TIP 2.8 STRL (NEEDLE) ×1 IMPLANT
ELECT NEEDLE TIP 2.8 STRL (NEEDLE) ×1 IMPLANT
ELECT PENCIL ROCKER SW 15FT (MISCELLANEOUS) IMPLANT
ELECT REM PT RETURN 15FT ADLT (MISCELLANEOUS) ×1 IMPLANT
GAUZE 4X4 16PLY ~~LOC~~+RFID DBL (SPONGE) ×1 IMPLANT
GAUZE PAD ABD 8X10 STRL (GAUZE/BANDAGES/DRESSINGS) IMPLANT
GAUZE SPONGE 4X4 12PLY STRL (GAUZE/BANDAGES/DRESSINGS) IMPLANT
GLOVE BIO SURGEON STRL SZ7.5 (GLOVE) ×1 IMPLANT
GLOVE INDICATOR 8.0 STRL GRN (GLOVE) ×1 IMPLANT
GOWN STRL REUS W/ TWL XL LVL3 (GOWN DISPOSABLE) ×1 IMPLANT
KIT BASIN OR (CUSTOM PROCEDURE TRAY) ×1 IMPLANT
KIT TURNOVER KIT A (KITS) ×1 IMPLANT
LOOP VESSEL MAXI BLUE (MISCELLANEOUS) IMPLANT
NDL HYPO 22X1.5 SAFETY MO (MISCELLANEOUS) ×1 IMPLANT
NEEDLE HYPO 22X1.5 SAFETY MO (MISCELLANEOUS) ×1 IMPLANT
PACK BASIC VI WITH GOWN DISP (CUSTOM PROCEDURE TRAY) ×1 IMPLANT
PAK SCROTO (SET/KITS/TRAYS/PACK) IMPLANT
RETRACTOR RING URO 16.6X16.6 (MISCELLANEOUS) IMPLANT
RETRACTOR STAY HOOK 5MM (MISCELLANEOUS) IMPLANT
SHEARS HARMONIC 9CM CVD (BLADE) IMPLANT
SPIKE FLUID TRANSFER (MISCELLANEOUS) ×1 IMPLANT
SURGILUBE 2OZ TUBE FLIPTOP (MISCELLANEOUS) ×1 IMPLANT
SUT CHROMIC 2 0 SH (SUTURE) IMPLANT
SUT CHROMIC 3 0 SH 27 (SUTURE) IMPLANT
SUT VIC AB 2-0 SH 27X BRD (SUTURE) IMPLANT
SUT VIC AB 2-0 UR6 27 (SUTURE) ×2 IMPLANT
SYR 20ML LL LF (SYRINGE) ×1 IMPLANT
TOWEL OR 17X26 10 PK STRL BLUE (TOWEL DISPOSABLE) ×1 IMPLANT

## 2024-01-15 NOTE — H&P (Signed)
 CC: Here today for surgery  HPI: Dave Rogers is an 63 y.o. male with history of HLD, whom has been following in the office --  He was seen by Lonell Liverpool, NP with his PCPs office-Dr. Townsend 06/19/2023. He was seen in their office for evaluation of possible hemorrhoids. He had noticed pain in the perianal area that began around 06/16/2023. He had flown home. On 11/30 and 12/1, he took multiple warm baths which seem to help with the pain. He was noted to have been using Preparation H and Metamucil at that time.  On 06/19/2023 when he was seen by Lonell Liverpool, she noted rectal mass at top of anus 1 cm firm rectal abscess. He was subsequently referred to see us  for further evaluation.  Reports that over the last 3 days his pain has improved significantly. He has had some apparent spontaneous drainage from the perianal area. No fever or chills. He has been taking Keflex  as prescribed now for the last 2 days.  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   He has been taking Metamucil for the last week or 2. He does note some vague drainage from the perianal area over the last month or so. Reports he has a daily bowel movement. Generally variable to hard in consistency. He drinks 5 to 6 glasses of water  a day. He spends 3 to 5 minutes on the commode when he has a bowel movement.  Reports he had a colonoscopy 10 years ago and is currently due for a new colonoscopy. He has not had this completed since relocating from [place] .  He has been doing reasonably well. He is taking fiber, Metamucil, consistently. He is working to stay hydrated. He is also minimizing his commode time to 1 to 2 minutes. He reports a soft bowel movement every morning. He denies any significant straining. He does report that he may have had a hemorrhoid a few weeks ago with some discomfort and possibly some blood in his stool. He denies any pain like he had in  December where he had severe swelling in the perianal area.  He denies any active drainage or other concerns to report today. He reports that this visit was for follow-up purposes.  INTERVAL HX Colonoscopy Dr. Leigh 11/22/2023: - Examined ileum normal - Diverticulosis in the left and right colon - Diminutive polyp in transverse removed - 3 mm polyp in descending colon removed - Anal papilla biopsied. - Internal hemorrhoids  PATH 1. Surgical [P], colon, transverse, polyp (1) :  - COLONIC MUCOSA WITH LAMINA PROPRIA EDEMA, FOCAL MINIMAL ACTIVITY AND  REACTIVE/REPARATIVE CHANGE, NONSPECIFIC.   2. Surgical [P], colon, descending, polyp (1) :  - TUBULAR ADENOMA.   3. 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  He does report that in his 84s, he had a known contact with someone who did report having had HPV infection. He denies any symptoms at present. He also denies any issues with his suspected or prior concern for anal fistula. No anorectal pain.  He denies any changes in health or health history since we met in the office. No new medications/allergies. He states he is ready for surgery today.    PMH: HLD;  anal fissure  PSH: Anal fissure surgery in [place]  in 2004; shoulder replacement January 2022 - Dr. Dozier  FHx: Denies any known family history of colorectal, breast, endometrial or ovarian cancer. Denies any known family history of inflammatory bowel disease, ulcerative colitis, or Crohn's disease.  Social Hx: Denies use of tobacco/EtOH/illicit drug. He is here today by himself. 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 Moved here 6 years ago from [place] .   Past Medical History:  Diagnosis Date   Arthritis     CAD in native artery non obstructive disease 09/01/2019   Hemorrhoids    Hyperlipidemia    LVH (left ventricular hypertrophy) 08/12/2021   Perirectal abscess    Unstable angina (HCC) 08/12/2021    Past Surgical History:  Procedure Laterality Date   ANAL FISSURE REPAIR  2006   HERNIA REPAIR Right 2006   LEFT HEART CATH AND CORONARY ANGIOGRAPHY N/A 08/11/2021   Procedure: LEFT HEART CATH AND CORONARY ANGIOGRAPHY;  Surgeon: Burnard Debby LABOR, MD;  Location: [facility];  Service: Cardiovascular;  Laterality: N/A;   TOTAL SHOULDER ARTHROPLASTY Right 08/01/2019   Procedure: TOTAL SHOULDER ARTHROPLASTY;  Surgeon: Dozier Soulier, MD;  Location: [facility];  Service: Orthopedics;  Laterality: Right;    Family History  Problem Relation Age of Onset   Dementia Mother    Cancer Father    Dementia Father    Heart attack Maternal Grandmother    Colon cancer Neg Hx    Stomach cancer Neg Hx    Esophageal cancer Neg Hx     Social:  reports that he has never smoked. He has never used smokeless tobacco. He reports that he does not currently use alcohol after a past usage of about 4.0 standard drinks of alcohol per week. He reports that he does not use drugs.  Allergies: No Known Allergies  Medications: I have reviewed the patient's current medications.  No results found for this or any previous visit (from the past 48 hours).  No results found.   PE Blood pressure (!) 155/80, pulse (!) 50, temperature 97.8 F (36.6 C), temperature source Oral, resp. rate 16, SpO2 99%. Constitutional: NAD; conversant Eyes: Moist conjunctiva; no lid lag; anicteric Lungs: Normal respiratory effort CV: RRR Psychiatric: Appropriate affect  No results found for this or any previous visit (from the past 48 hours).  No results found.  A/P: Dave Rogers is an 63 y.o. male with hx of HLD, remote anal fissure surgery here previously for spontaneously draining left posterior perianal abscess now here for  evident LGSIL found on anal bx during colonoscopy  Colonoscopy 11/22/23 Dr. Leigh -favored hypertrophied papilla was biopsied but demonstrated findings of LGSIL  -Obtain one-time screening test for HIV given LGSIL/HPV -The anatomy and physiology of the anal canal was discussed with the patient with associated pictures. The pathophysiology of anorectal lesions and LGSIL was discussed at length. We discussed that the findings of LGSIL suggest infection with the human papilloma virus (HPV) which is a sexually transmitted infection and the recommendations for safe sex practices therein. - Given that there is an evident lesion, we discussed options going forward, recommending removal- transanal excision of distal rectal lesion; anorectal exam under anesthesia  -The planned procedure, material risks (including, but not limited to, pain, bleeding, infection, scarring, need for blood transfusion, damage to anal sphincter, incontinence of gas and/or stool, need for additional procedures, anal stenosis, rare cases of pelvic sepsis which in severe cases may require things like  a colostomy, recurrence, pneumonia, heart attack, stroke, death) benefits and alternatives to surgery were discussed at length. I noted a good probability that the procedure would help improve their symptoms. The patient's questions were answered to his satisfaction, he voiced understanding and elected to proceed with surgery. Additionally, we discussed typical postoperative expectations and the recovery process.  -Long-term plans from a treatment/surveillance standpoint based upon final pathology from removal. We did discuss an increased lifetime risk of anorectal cancer in patients with LGSIL and the importance of surveillance moving forward.  -From prior visits, there remains no evidence of active perianal abscess or active fistula at present. There is no fluctuance on exam. I have encouraged him to continue taking fiber  supplementation-Metamucil-daily. We discussed the importance of hydration, 5 to 6 glasses of water  per day at a minimum. We did also discussed minimizing commode times to 2 to 3 minutes. Sitz baths as needed. We discussed that also has a strategy to help prevent problems with hemorrhoids down the road    Lonni Pizza, MD [facility]

## 2024-01-15 NOTE — Discharge Instructions (Addendum)

## 2024-01-15 NOTE — Anesthesia Procedure Notes (Signed)
 Procedure Name: Intubation Date/Time: 01/15/2024 1:34 PM  Performed by: Therisa Doyal CROME, CRNAPre-anesthesia Checklist: Patient identified, Emergency Drugs available, Suction available and Patient being monitored Patient Re-evaluated:Patient Re-evaluated prior to induction Oxygen Delivery Method: Circle system utilized Preoxygenation: Pre-oxygenation with 100% oxygen Induction Type: IV induction Ventilation: Mask ventilation without difficulty and Oral airway inserted - appropriate to patient size Laryngoscope Size: Miller and 3 Grade View: Grade II Tube type: Oral Tube size: 7.5 mm Number of attempts: 1 Airway Equipment and Method: Stylet and Oral airway Placement Confirmation: ETT inserted through vocal cords under direct vision, positive ETCO2 and breath sounds checked- equal and bilateral Secured at: 23 cm Tube secured with: Tape Dental Injury: Teeth and Oropharynx as per pre-operative assessment

## 2024-01-15 NOTE — Op Note (Addendum)
 01/15/2024  2:23 PM  PATIENT:  Dave Rogers  63 y.o. 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  EBL: 5 mL  Drains: None  PLAN OF CARE: Discharge to home after PACU  PATIENT DISPOSITION:  PACU - hemodynamically stable.  OR FINDINGS: Within the distal rectum there is an evident lesion consistent with what was seen endoscopically and had history of LGSIL at biopsy site.  This was fully excised.  Additionally, in the left posterior position of the anal margin just outside the anal os, there is an evident raised skin lesion which was excised and submitted separately.  DESCRIPTION: The patient was identified in the preoperative holding area and taken to the OR. SCDs were applied. He then underwent general endotracheal anesthesia without difficulty. The patient was then rolled onto the OR table in the prone jackknife position. Pressure points were then evaluated and padded. Benzoin was applied to the buttocks and they were gently taped apart.  He was then prepped and draped in usual sterile fashion.  A surgical timeout was performed indicating the correct patient, procedure, and positioning.  A perianal block was then created using a dilute mixture of 0.25% Marcaine  with epinephrine and Exparel .  After  ascertaining an appropriate level of anesthesia had been achieved, a well lubricated digital rectal exam was performed. This demonstrated no palpable masses but some subtle soft tissue within the left posterior position.  A Hill-Ferguson anoscope was into the anal canal and circumferential inspection demonstrated healthy appearing anoderm.  In the left posterior distal rectum just above the dentate line there is an evident raised lesion which is consistent with what was seen endoscopically during his colonoscopy.  Externally in the left posterior position on the anal skin just outside the anal os there is an evident soft mobile skin lesion.  Attention is first directed at the distal rectal lesion.  This is elevated with a DeBakey forcep.  Borders of the excision are marked with cautery which only includes healthy normal-appearing surrounding tissue.  This lesion measures approximately 1 x 1 cm.  This is then scored and fully excised sharply.  No sphincter muscle was divided during this excision process.  This was done using cutting current.  This was then passed off the specimen.  Hemostasis is achieved electrocautery.  The resultant defect is then closed using 2-0 Vicryl sutures.  The anal canal is irrigated.  Hemostasis is verified.  Circumferential anoscopy does not demonstrate any other evident lesions within the anal canal or distal rectal area.   Externally, at the level of the anal os within the anal margin, there is a soft raised skin lesion as noted above.  Preparations were made for excision of this as well.  Borders of excision are marked such that it there is nothing but normal skin at this location.  The incision  is created using cutting current and the lesion is dissected free from any underlying sphincter muscle.  No sphincter muscle is divided. The lesion is fully excised.  This was submitted separately as specimen.  Hemostasis is achieved electrocautery.  The resultant defect is then closed  using 3-0 chromic sutures.  The area is irrigated.  Reinspection of the anal canal and external anal skin demonstrates hemostasis.  Again, no other pathology is evident.  Additional local anesthetic is infiltrated at the excision sites.  All sponge, needle, and instrument counts are reported correct.  The buttocks are untaped.  Topical Dibucaine ointment is applied.  A dressing consisting of 4 x 4's, ABD, and mesh underwear is ultimately placed.  He was rolled back onto a stretcher, awakened from anesthesia, extubated, and transported to the recovery room in satisfactory condition.  As per his request preoperatively, family was not notified at completion of the procedure  DISPOSITION: PACU in satisfactory condition.

## 2024-01-15 NOTE — Transfer of Care (Signed)
 Immediate Anesthesia Transfer of Care Note  Patient: Dave Rogers  Procedure(s) Performed: EXCISION, MASS, RECTUM, ANAL APPROACH  Patient Location: PACU  Anesthesia Type:General  Level of Consciousness: drowsy  Airway & Oxygen Therapy: Patient Spontanous Breathing and Patient connected to face mask oxygen  Post-op Assessment: Report given to RN and Post -op Vital signs reviewed and stable  Post vital signs: Reviewed and stable  Last Vitals:  Vitals Value Taken Time  BP 126/64 01/15/24 14:37  Temp    Pulse 66 01/15/24 14:38  Resp 18 01/15/24 14:38  SpO2 100 % 01/15/24 14:38  Vitals shown include unfiled device data.  Last Pain:  Vitals:   01/15/24 1238  TempSrc:   PainSc: 0-No pain         Complications: No notable events documented.

## 2024-01-16 ENCOUNTER — Encounter (HOSPITAL_COMMUNITY): Payer: Self-pay | Admitting: Surgery

## 2024-01-16 LAB — SURGICAL PATHOLOGY

## 2024-01-16 NOTE — Anesthesia Postprocedure Evaluation (Signed)
 Anesthesia Post Note  Patient: Dave Rogers  Procedure(s) Performed: EXCISION, MASS, RECTUM, ANAL APPROACH     Patient location during evaluation: PACU Anesthesia Type: General Level of consciousness: awake and alert Pain management: pain level controlled Vital Signs Assessment: post-procedure vital signs reviewed and stable Respiratory status: spontaneous breathing, nonlabored ventilation, respiratory function stable and patient connected to nasal cannula oxygen Cardiovascular status: blood pressure returned to baseline and stable Postop Assessment: no apparent nausea or vomiting Anesthetic complications: no   No notable events documented.  Last Vitals:  Vitals:   01/15/24 1519 01/15/24 1530  BP: (!) 127/93   Pulse: (!) 51   Resp: (!) 2 14  Temp:    SpO2: 100%     Last Pain:  Vitals:   01/15/24 1530  TempSrc:   PainSc: 0-No pain                 Dave Rogers

## 2024-01-17 ENCOUNTER — Ambulatory Visit: Payer: Self-pay | Admitting: Surgery

## 2024-07-01 NOTE — Progress Notes (Signed)
 PROVIDER:  LONNI OZELL PIZZA, MD  MRN: [id number] DOB: [birth year] DATE OF ENCOUNTER: 07/02/2024  Subjective   Chief Complaint: LGSIL, follow-up    History of Present Illness: Dave Rogers is a 63 y.o. male with history of HLD, whom is seen in the office today for follow-up    He was seen by Dave Liverpool, NP with his PCPs office-Dave Rogers 06/19/2023.  He was seen in their office for evaluation of possible hemorrhoids.  He had noticed pain in the perianal area that began around 06/16/2023.  He had flown home.  On 11/30 and 12/1, he took multiple warm baths which seem to help with the pain.  He was noted to have been using Preparation H and Metamucil at that time.  On 06/19/2023 when he was seen by Dave Rogers, she noted rectal mass at top of anus 1 cm firm rectal abscess.  He was subsequently referred to see us  for further evaluation.  Reports that over the last 3 days his pain has improved significantly.  He has had some apparent spontaneous drainage from the perianal area.  No fever or chills.  He has been taking Keflex  as prescribed now for the last 2 days.  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   He has been taking Metamucil for the last week or 2.  He does note some vague drainage from the perianal area over the last month or so.  Reports he has a daily bowel movement.  Generally variable to hard in consistency.  He drinks 5 to 6 glasses of water  a day.  He spends 3 to 5 minutes on the commode when he has a bowel movement.  Reports he had a colonoscopy 10 years ago and is currently due for a new colonoscopy.  He has not had this completed since relocating from [place] .   He has been doing reasonably well.  He is taking fiber, Metamucil, consistently.  He is working to stay hydrated.  He is also minimizing his commode time to 1 to 2 minutes.  He reports a soft bowel movement every morning.  He denies  any significant straining.  He does report that he may have had a hemorrhoid a few weeks ago with some discomfort and possibly some blood in his stool.  He denies any pain like he had in December where he had severe swelling in the perianal area.  He denies any active drainage or other concerns to report today.  He reports that this visit was for follow-up purposes.  Colonoscopy Dr. Leigh 11/22/2023: - Examined ileum normal - Diverticulosis in the left and right colon - Diminutive polyp in transverse removed - 3 mm polyp in descending colon removed - Anal papilla biopsied. - Internal hemorrhoids  PATH      1. Surgical [P], colon, transverse, polyp (1) :       -  COLONIC MUCOSA WITH LAMINA PROPRIA EDEMA, FOCAL MINIMAL ACTIVITY AND       REACTIVE/REPARATIVE CHANGE, NONSPECIFIC.        2. Surgical [P], colon, descending, polyp (1) :       -  TUBULAR ADENOMA.        3. Surgical [P], colon, hypertrophied anal papillae :       -  SQUAMOUS MUCOSA WITH MILD KOILOCYTIC TYPE ATYPIA SUGGESTIVE OF LOW-GRADE       SQUAMOUS INTRAEPITHELIAL LESION/ANAL INTRAEPITHELIAL NEOPLASIA 1 OF 3.  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  He does report that in his 85s, he had a known contact with someone who did report having had HPV infection.  He denies any symptoms at present.  He also denies any issues with his suspected or prior concern for anal fistula.  No anorectal pain.   OR 01/15/24 Transanal excision of distal rectal lesion - left posterior Excision of left perianal/anal margin lession Anorectal exam under anesthesia   OR FINDINGS: Within the distal rectum there is an evident lesion consistent with what was seen endoscopically and had history of LGSIL at biopsy site.  This was fully excised.  Additionally, in the left posterior position of the anal margin just outside the anal os, there is an evident raised skin lesion which was excised and submitted  separately.   PATH A. LEFT POSTERIOR DISTAL RECTAL LESION:  - Hypertrophied anal papilla  - No dysplasia or malignancy   B. LEFT POSTERIOR ANAL MARGIN LESION:  - Hypertrophied anal papilla  - No dysplasia or malignancy   INTERVAL HX Returns today for follow-up.  He does report that he developed some mild discomfort particular with long sitting on along the left buttock.  A couple days later this subsequently started to drain and he felt much better.  It has recurred approximately 3 times over the last 2 to 3 months.  No significant pain associated with this nor fever/chills.  Has a sensation that there is some fullness of the left buttock that is there all the time now.   PMH: HLD; anal fissure  PSH: Anal fissure surgery in [place]  in 2004; shoulder replacement January 2022 - Dr. Dozier  FHx: Denies any known family history of colorectal, breast, endometrial or ovarian cancer.  Denies any known family history of inflammatory bowel disease, ulcerative colitis, or Crohn's disease.  Social Hx: Denies use of tobacco/EtOH/illicit drug.  He is here today by himself.  He works as the warehouse manager of a facilities manager.  Moved here 6 years ago from [place] .   Medical History: History reviewed. No pertinent past medical history.  There is no problem list on file for this patient.   Past Surgical History:  Procedure Laterality Date   Anal Fissure Repair     TOTAL SHOULDER REPLACEMENT       No Known Allergies  Current Outpatient Medications on File Prior to Visit  Medication Sig Dispense Refill   rosuvastatin  (CRESTOR ) 20 MG tablet Take 20 mg by mouth     No current facility-administered medications on file prior to visit.    History reviewed. No pertinent family history.   Social History   Tobacco Use  Smoking Status Never  Smokeless Tobacco Never     Social History   Socioeconomic History   Marital status: Married  Tobacco Use   Smoking  status: Never   Smokeless tobacco: Never  Vaping Use   Vaping status: Never Used  Substance and Sexual Activity   Alcohol use: Yes   Drug use: Never   Sexual activity: Defer   Social Drivers of Health   Physical Activity: Unknown (08/29/2017)   Received from [employer]   Exercise Vital Sign    Days of Exercise per Week: 3 days  Housing Stability: Unknown (09/25/2023)   Housing Stability Vital Sign    Homeless in the Last Year: No    Objective:    Vitals:   07/01/24 1553  PainSc: 0-No pain  There is no height or weight on file to calculate BMI.  Constitutional: NAD; conversant Eyes: Moist conjunctiva; anicteric Lungs: Normal respiratory effort Anorectal: Normal perianal skin without evident lesions.  Slight fullness to the left buttock with some scarring palpable.  No erythema.  No external openings.  No visible fissure Anoscopy: Circumferential anoscopy demonstrates healthy appearing anoderm.  No active fissure.  No clearly identifiable internal opening. No visible lesions Psychiatric: Appropriate affect  Procedure: Anoscopy We spent time reviewing the procedure, material risks (including but not limited to perianal pain, bleeding) benefits and alternatives.  Witnessed, informed consent was obtained.  Narrative:  The lubricated anoscope was gently inserted into the anal canal. Circumferential anoscopy demonstrates findings noted above  A chaperone, Dave Rogers CMA, was present for this encounter and examination   Assessment and Plan:  Diagnoses and all orders for this visit:  Anal intraepithelial neoplasia I (AIN I) -     MRI pelvis with and without contrast; Future    Dave Rogers is a very pleasant 63 y.o. 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 Leigh -favored  hypertrophied papilla was biopsied but demonstrated findings of LGSIL  HIV test negative 07/2021  OR 01/15/24 TAE distal rectal lesion - left posterior; excision left anal margin lesion PATH -hypertrophied anal papilla; no dysplasia or malignancy.  Anal margin lesion also demonstrated hypertrophied anal papilla without dysplasia or malignancy.  - Will plan to see him back for follow-up in approximately 1 year for reevaluation or sooner if any other issues or concerns arise.  He expressed understanding and agreement to plan.  -Long-term plans from a treatment/surveillance standpoint based upon final pathology from removal.  We did discuss an increased lifetime risk of anorectal cancer in patients with LGSIL and the importance of surveillance moving forward.  -Suspect she could have an underlying perianal fistula based on his symptoms.  There is no external correlate with an open wound.  I do not see any clear opening within the anal canal per se at present.  I think given his frequency of symptoms to having now had 3 or 4 bouts of swelling and subsequent drainage, there is the possibility he does have a fistula.  Would start with a pelvic MRI for further evaluation given no external correlates at present.  -Return for follow-up after MRI to discuss next apps.  Return in about 1 month (around 08/01/2024).  I spent a total of 30 minutes in both face-to-face and non-face-to-face activities, excluding procedures performed, for this visit on the date of this encounter.   Lonni Pizza, MD [facility], [facility]

## 2024-07-02 ENCOUNTER — Other Ambulatory Visit: Payer: Self-pay | Admitting: Surgery

## 2024-07-02 DIAGNOSIS — K6282 Dysplasia of anus: Secondary | ICD-10-CM

## 2024-07-03 ENCOUNTER — Ambulatory Visit
Admission: RE | Admit: 2024-07-03 | Discharge: 2024-07-03 | Disposition: A | Discharge status: Home / Self Care | Source: Ambulatory Visit | Attending: Surgery | Admitting: Surgery

## 2024-07-03 DIAGNOSIS — K6282 Dysplasia of anus: Secondary | ICD-10-CM

## 2024-07-03 MED ORDER — GADOPICLENOL 0.5 MMOL/ML IV SOLN
7.5000 mL | Freq: Once | INTRAVENOUS | Status: AC | PRN
  Administered 2024-07-03: 16:00:00 7.5 mL via INTRAVENOUS

## 2024-07-09 ENCOUNTER — Encounter: Payer: Self-pay | Admitting: Cardiology

## 2024-07-09 ENCOUNTER — Ambulatory Visit: Attending: Cardiology | Admitting: Cardiology

## 2024-07-09 VITALS — BP 122/78 | HR 49 | Ht 71.0 in | Wt 194.8 lb

## 2024-07-09 DIAGNOSIS — I2510 Atherosclerotic heart disease of native coronary artery without angina pectoris: Secondary | ICD-10-CM | POA: Diagnosis not present

## 2024-07-09 DIAGNOSIS — R0789 Other chest pain: Secondary | ICD-10-CM | POA: Diagnosis not present

## 2024-07-09 DIAGNOSIS — I422 Other hypertrophic cardiomyopathy: Secondary | ICD-10-CM

## 2024-07-09 DIAGNOSIS — E782 Mixed hyperlipidemia: Secondary | ICD-10-CM | POA: Diagnosis not present

## 2024-07-09 LAB — LIPID PANEL

## 2024-07-09 MED ORDER — ISOSORBIDE MONONITRATE ER 30 MG PO TB24: 30.0000 mg | ORAL_TABLET | Freq: Every day | ORAL | 3 refills | Status: DC

## 2024-07-09 NOTE — Patient Instructions (Addendum)
 Medication Instructions:  Your physician has recommended you make the following change in your medication:  START: Imdur  30 mg once daily  *If you need a refill on your cardiac medications before your next appointment, please call your pharmacy*  Lab Work: CMET, Mag, Lipids, CBC If you have labs (blood work) drawn today and your tests are completely normal, you will receive your results only by: MyChart Message (if you have MyChart) OR A paper copy in the mail If you have any lab test that is abnormal or we need to change your treatment, we will call you to review the results.  Testing/Procedures: Cath   [facility] DE[facility] OF [facility] [place] AT [address] A DEPT OF THE [place] [address] [place] [address] Dept: [phone] Loc: [phone]  Dave Rogers  07/09/2024  You are scheduled for a Cardiac Catheterization on Friday December 26 with Dr. Copper  1. Please arrive at the [facility] (Main Entrance A) at [facility]: [address] [place], [place] [address] at 10:00 (This time is 2 hour(s) before your procedure to ensure your preparation).   Free valet parking service is available. You will check in at ADMITTING. The support person will be asked to wait in the waiting room.  It is OK to have someone drop you off and come back when you are ready to be discharged.    Special note: Every effort is made to have your procedure done on time. Please understand that emergencies sometimes delay scheduled procedures.  2. Diet: Nothing to eat after midnight.   3. Hydration: You need to be well hydrated before your procedure. On December 26 you may drink approved liquids (see below) until 2 hours before the procedure, with 16 oz of water  as your last intake.   List of approved liquids water , clear juice, clear tea, black coffee, fruit juices, non-citric and without pulp, carbonated beverages, Gatorade, Kool -Aid, plain  Jello-O and plain ice popsicles.  4. Labs: You will need to have blood drawn on Wednesday, December 23 at [facility] (1st Floor), [address] [place], [place] [address]. You do not need to be fasting.  5. Medication instructions in preparation for your procedure:   Contrast Allergy: No   On the morning of your procedure, take your Aspirin  81 mg and any morning medicines NOT listed above.  You may use sips of water .  6. Plan to go home the same day, you will only stay overnight if medically necessary. 7. Bring a current list of your medications and current insurance cards. 8. You MUST have a responsible person to drive you home. 9. Someone MUST be with you the first 24 hours after you arrive home or your discharge will be delayed. 10. Please wear clothes that are easy to get on and off and wear slip-on shoes.  Thank you for allowing us  to care for you!   --  Invasive Cardiovascular services   Follow-Up: At [facility], you and your health needs are our priority.  As part of our continuing mission to provide you with exceptional heart care, our providers are all part of one team.  This team includes your primary Cardiologist (physician) and Advanced Practice Providers or APPs (Physician Assistants and Nurse Practitioners) who all work together to provide you with the care you need, when you need it.  Your next appointment:  2 weeks after cath  Provider:   One of our Advanced Practice Providers (APPs): Morse Clause, PA-C  Lamarr Satterfield, NP Miriam Shams, NP  Olivia Pavy, PA-C Josefa Beauvais, NP  Leontine Salen, PA-C Orren Fabry, PA-C  [place], PA-C Ernest Dick, NP  Damien Braver, NP Jon Hails, PA-C  Waddell Donath, PA-C    Dayna Dunn, PA-C  Scott Weaver, PA-C Lum Louis, NP Katlyn West, NP Callie Goodrich, PA-C  Xika Zhao, NP Sheng Haley, PA-C    Kathleen Johnson, PA-C

## 2024-07-09 NOTE — Progress Notes (Signed)
 " Cardiology Office Note:    Date:  07/09/2024   ID:  Dave Rogers, DOB 09-04-1960, MRN [id number]  PCP:  Patient, No Pcp Per  Cardiologist:  Ozell Fell, MD  Electrophysiologist:  None   Referring MD: No ref. provider found    I am having chest pain    History of Present Illness:    Dave Rogers is a 63 y.o. 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  His last heart catheterization was back in 2023 where he had minimal nonobstructive disease.  He follow-up with Dr. Ozell Fell and he was last seen in July or 2024.  Today he reports that he has been experiencing He has had intermittent chest tightness over the past week with increasing frequency. The pain is left-sided and sometimes radiates to the jaw.  He noticed that he has been getting progressively worse and he is concerned about this.  He takes Crestor  and started baby aspirin  about a month ago. He does not smoke and does not have diabetes. His last lipid profile about a year ago showed an LDL of 69.  Past Medical History:  Diagnosis Date   Arthritis    CAD in native artery non obstructive disease 09/01/2019   Hemorrhoids    Hyperlipidemia    LVH (left ventricular hypertrophy) 08/12/2021   Perirectal abscess    Unstable angina (HCC) 08/12/2021    Past Surgical History:  Procedure Laterality Date   ANAL FISSURE REPAIR  2006   HERNIA REPAIR Right 2006   LEFT HEART CATH AND CORONARY ANGIOGRAPHY N/A 08/11/2021   Procedure: LEFT HEART CATH AND CORONARY ANGIOGRAPHY;  Surgeon: Burnard Debby LABOR, MD;  Location: [facility];  Service: Cardiovascular;  Laterality: N/A;   TOTAL SHOULDER ARTHROPLASTY Right 08/01/2019   Procedure: TOTAL SHOULDER ARTHROPLASTY;  Surgeon: Dozier Soulier, MD;  Location: [facility];  Service: Orthopedics;  Laterality: Right;   TRANSANAL EXCISION OF RECTAL MASS N/A  01/15/2024   Procedure: EXCISION, MASS, RECTUM, ANAL APPROACH;  Surgeon: Teresa Lonni HERO, MD;  Location: [facility];  Service: General;  Laterality: N/A;  TRANSANAL EXCISION OF DISTAL RECTAL LESION    Current Medications: Active Medications[1]   Allergies:   Patient has no known allergies.   Social History   Socioeconomic History   Marital status: Married    Spouse name: Not on file   Number of children: 1   Years of education: Not on file   Highest education level: Not on file  Occupational History   Occupation: furniture business  Tobacco Use   Smoking status: Never   Smokeless tobacco: Never  Vaping Use   Vaping status: Never Used  Substance and Sexual Activity   Alcohol use: Not Currently    Alcohol/week: 4.0 standard drinks of alcohol    Types: 4 Standard drinks or equivalent per week    Comment: ocassional   Drug use: No   Sexual activity: Yes  Other Topics Concern   Not on file  Social History Narrative   Not on file   Social Drivers of Health   Tobacco Use: Low Risk (07/09/2024)   Patient History    Smoking Tobacco Use: Never    Smokeless Tobacco Use: Never    Passive Exposure: Not on file  Financial Resource Strain: Not on file  Food Insecurity: Not on file  Transportation Needs: Not on file  Physical Activity: Not on file  Stress: Not on  file  Social Connections: Not on file  Depression (PHQ2-9): Low Risk (09/04/2022)   Depression (PHQ2-9)    PHQ-2 Score: 0  Alcohol Screen: Not on file  Housing: Unknown (09/25/2023)   Received from [facility]   Epic    Unable to Pay for Housing in the Last Year: Not on file    Number of Times Moved in the Last Year: Not on file    At any time in the past 12 months, were you homeless or living in a shelter (including now)?: No  Utilities: Not on file  Health Literacy: Not on file     Family History: The patient's family history includes Cancer in his father; Dementia in his father and mother;  Heart attack in his maternal grandmother. There is no history of Colon cancer, Stomach cancer, or Esophageal cancer.  ROS:   Review of Systems  Constitution: Negative for decreased appetite, fever and weight gain.  HENT: Negative for congestion, ear discharge, hoarse voice and sore throat.   Eyes: Negative for discharge, redness, vision loss in right eye and visual halos.  Cardiovascular: Negative for chest pain, dyspnea on exertion, leg swelling, orthopnea and palpitations.  Respiratory: Negative for cough, hemoptysis, shortness of breath and snoring.   Endocrine: Negative for heat intolerance and polyphagia.  Hematologic/Lymphatic: Negative for bleeding problem. Does not bruise/bleed easily.  Skin: Negative for flushing, nail changes, rash and suspicious lesions.  Musculoskeletal: Negative for arthritis, joint pain, muscle cramps, myalgias, neck pain and stiffness.  Gastrointestinal: Negative for abdominal pain, bowel incontinence, diarrhea and excessive appetite.  Genitourinary: Negative for decreased libido, genital sores and incomplete emptying.  Neurological: Negative for brief paralysis, focal weakness, headaches and loss of balance.  Psychiatric/Behavioral: Negative for altered mental status, depression and suicidal ideas.  Allergic/Immunologic: Negative for HIV exposure and persistent infections.    EKGs/Labs/Other Studies Reviewed:    The following studies were reviewed today:   EKG:  The ekg ordered today demonstrates   Recent Labs: 01/02/2024: BUN 14; Creatinine, Ser 0.81; Hemoglobin 14.3; Platelets 178; Potassium 4.0; Sodium 139  Recent Lipid Panel    Component Value Date/Time   CHOL 152 03/24/2023 1035   TRIG 94 03/24/2023 1035   HDL 65 03/24/2023 1035   CHOLHDL 2.3 03/24/2023 1035   VLDL 20 08/11/2021 0149   LDLCALC 69 03/24/2023 1035    Physical Exam:    VS:  BP 122/78   Pulse (!) 49   Ht 5' 11 (1.803 m)   Wt 194 lb 12.8 oz (88.4 kg)   SpO2 96%   BMI  27.17 kg/m     Wt Readings from Last 3 Encounters:  07/09/24 194 lb 12.8 oz (88.4 kg)  01/15/24 184 lb (83.5 kg)  01/01/24 185 lb (83.9 kg)     GEN: Well nourished, well developed in no acute distress HEENT: Normal NECK: No JVD; No carotid bruits LYMPHATICS: No lymphadenopathy CARDIAC: S1S2 noted,RRR, no murmurs, rubs, gallops RESPIRATORY:  Clear to auscultation without rales, wheezing or rhonchi  ABDOMEN: Soft, non-tender, non-distended, +bowel sounds, no guarding. EXTREMITIES: No edema, No cyanosis, no clubbing MUSCULOSKELETAL:  No deformity  SKIN: Warm and dry NEUROLOGIC:  Alert and oriented x 3, non-focal PSYCHIATRIC:  Normal affect, good insight  ASSESSMENT:    1. Chest discomfort   2. Hyperlipidemia, mixed   3. Hypertrophic nonobstructive cardiomyopathy (HCC)   4. CAD in native artery non obstructive disease    PLAN:   Coronary artery disease with typical angina Intermittent chest  pain with jaw discomfort suggests typical angina. Previous catheterization showed mild disease. Current EKG unchanged. Symptoms suggest possible disease progression. Explained catheterization rationale and potential outcomes. - Ordered heart catheterization to assess for progression.  This has been scheduled for July 12, 2024 - Continue Crestor  and baby aspirin . - Ordered CBC. - Scheduled follow-up with advanced practice practitioner two weeks post-procedure.  Nonobstructive hypertrophic cardiomyopathy-if his heart catheterization is normal we may need to put him on the treadmill to understand if he is experiencing symptoms from his hypertrophic cardiomyopathy.  Unfortunately I cannot put the patient on beta-blocker given his underlying bradycardia.  Will get some blood work today including his lipid panel, CBC c-Met and magnesium  He will follow-up in 2 weeks post cath.   The patient is in agreement with the above plan. The patient left the office in stable condition.  The patient will  follow up in   Medication Adjustments/Labs and Tests Ordered: Current medicines are reviewed at length with the patient today.  Concerns regarding medicines are outlined above.  Orders Placed This Encounter  Procedures   Comp Met (CMET)   Magnesium   Lipid panel   CBC   EKG 12-Lead   Meds ordered this encounter  Medications   DISCONTD: isosorbide  mononitrate (IMDUR ) 30 MG 24 hr tablet    Sig: Take 1 tablet (30 mg total) by mouth daily.    Dispense:  90 tablet    Refill:  3    Patient Instructions  Medication Instructions:  Your physician has recommended you make the following change in your medication:  START: Imdur  30 mg once daily  *If you need a refill on your cardiac medications before your next appointment, please call your pharmacy*  Lab Work: CMET, Mag, Lipids, CBC If you have labs (blood work) drawn today and your tests are completely normal, you will receive your results only by: MyChart Message (if you have MyChart) OR A paper copy in the mail If you have any lab test that is abnormal or we need to change your treatment, we will call you to review the results.  Testing/Procedures: Cath   [place] [facility] A DEPT OF [place]. [facility] AT [address] A DEPT OF THE [place]. [facility] [address] [place] [address] Dept: [phone] Loc: [phone]  Farhad Burleson  07/09/2024  You are scheduled for a Cardiac Catheterization on Friday December 26 with Dr. Copper  1. Please arrive at the [facility] (Main Entrance A) at [facility]: [address] [place], [place] [address] at 10:00 (This time is 2 hour(s) before your procedure to ensure your preparation).   Free valet parking service is available. You will check in at ADMITTING. The support person will be asked to wait in the waiting room.  It is OK to have someone drop you off and come back when you are ready to be discharged.    Special note: Every effort is made  to have your procedure done on time. Please understand that emergencies sometimes delay scheduled procedures.  2. Diet: Nothing to eat after midnight.   3. Hydration: You need to be well hydrated before your procedure. On December 26 you may drink approved liquids (see below) until 2 hours before the procedure, with 16 oz of water  as your last intake.   List of approved liquids water , clear juice, clear tea, black coffee, fruit juices, non-citric and without pulp, carbonated beverages, Gatorade, Kool -Aid, plain Jello-O  and plain ice popsicles.  4. Labs: You will need to have blood drawn on Wednesday, December 23 at [facility] (1st Floor), [address], [place], [place] [address]. You do not need to be fasting.  5. Medication instructions in preparation for your procedure:   Contrast Allergy: No   On the morning of your procedure, take your Aspirin  81 mg and any morning medicines NOT listed above.  You may use sips of water .  6. Plan to go home the same day, you will only stay overnight if medically necessary. 7. Bring a current list of your medications and current insurance cards. 8. You MUST have a responsible person to drive you home. 9. Someone MUST be with you the first 24 hours after you arrive home or your discharge will be delayed. 10. Please wear clothes that are easy to get on and off and wear slip-on shoes.  Thank you for allowing us  to care for you!   -- [facility]   Follow-Up: At [facility], you and your health needs are our priority.  As part of our continuing mission to provide you with exceptional heart care, our providers are all part of one team.  This team includes your primary Cardiologist (physician) and Advanced Practice Providers or APPs (Physician Assistants and Nurse Practitioners) who all work together to provide you with the care you need, when you need it.  Your  next appointment:    2 weeks after cath  Provider:   One of our Advanced Practice Providers (APPs): Morse Clause, PA-C  Lamarr Satterfield, NP Miriam Shams, NP  Olivia Pavy, PA-C Josefa Beauvais, NP  Leontine Salen, PA-C Orren Fabry, PA-C  Wade Hampton, PA-C Ernest Dick, NP  Damien Braver, NP Jon Hails, PA-C  Waddell Donath, PA-C    Dayna Dunn, PA-C  Scott Weaver, PA-C Lum Louis, NP Katlyn West, NP Callie Goodrich, PA-C  Xika Zhao, NP Sheng Haley, PA-C    Kathleen Johnson, PA-C   Adopting a Healthy Lifestyle.  Know what a healthy weight is for you (roughly BMI <25) and aim to maintain this   Aim for 7+ servings of fruits and vegetables daily   65-80+ fluid ounces of water  or unsweet tea for healthy kidneys   Limit to max 1 drink of alcohol per day; avoid smoking/tobacco   Limit animal fats in diet for cholesterol and heart health - choose grass fed whenever available   Avoid highly processed foods, and foods high in saturated/trans fats   Aim for low stress - take time to unwind and care for your mental health   Aim for 150 min of moderate intensity exercise weekly for heart health, and weights twice weekly for bone health   Aim for 7-9 hours of sleep daily   When it comes to diets, agreement about the perfect plan isnt easy to find, even among the experts. Experts at the [facility] of [employer] developed an idea known as the Healthy Eating Plate. Just imagine a plate divided into logical, healthy portions.   The emphasis is on diet quality:   Load up on vegetables and fruits - one-half of your plate: Aim for color and variety, and remember that potatoes dont count.   Go for whole grains - one-quarter of your plate: Whole wheat, barley, wheat berries, quinoa, oats, brown rice, and foods made with them. If you want pasta, go with whole wheat pasta.  Protein power - one-quarter of your plate: Fish, chicken, beans, and nuts are all healthy, versatile protein  sources. Limit red meat.   The diet, however, does go beyond the plate, offering a few other suggestions.   Use healthy plant oils, such as olive, canola, soy, corn, sunflower and peanut. Check the labels, and avoid partially hydrogenated oil, which have unhealthy trans fats.   If youre thirsty, drink water . Coffee and tea are good in moderation, but skip sugary drinks and limit milk and dairy products to one or two daily servings.   The type of carbohydrate in the diet is more important than the amount. Some sources of carbohydrates, such as vegetables, fruits, whole grains, and beans-are healthier than others.   Finally, stay active  Signed, Johany Hansman, DO  07/09/2024 5:00 PM    [facility]    [1]  Current Meds  Medication Sig   Ascorbic Acid (VITAMIN C PO) Take 1 tablet by mouth in the morning. (Patient not taking: Reported on 07/09/2024)   aspirin  EC 81 MG tablet Take 81 mg by mouth at bedtime. Swallow whole.   B-COMPLEX-C PO Take 1 tablet by mouth in the morning.   METAMUCIL FIBER PO Take 1 tablet by mouth daily.   rosuvastatin  (CRESTOR ) 20 MG tablet Take 1 tablet by mouth every other day   VITAMIN D  PO Take 1 tablet by mouth in the morning.   [DISCONTINUED] isosorbide  mononitrate (IMDUR ) 30 MG 24 hr tablet Take 1 tablet (30 mg total) by mouth daily. (Patient not taking: Reported on 07/09/2024)   "

## 2024-07-09 NOTE — H&P (View-Only) (Signed)
 " Cardiology Office Note:    Date:  07/09/2024   ID:  Dave Rogers, DOB 09-04-1960, MRN [id number]  PCP:  Patient, No Pcp Per  Cardiologist:  Ozell Fell, MD  Electrophysiologist:  None   Referring MD: No ref. provider found    I am having chest pain    History of Present Illness:    Dave Rogers is a 63 y.o. 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  His last heart catheterization was back in 2023 where he had minimal nonobstructive disease.  He follow-up with Dr. Ozell Fell and he was last seen in July or 2024.  Today he reports that he has been experiencing He has had intermittent chest tightness over the past week with increasing frequency. The pain is left-sided and sometimes radiates to the jaw.  He noticed that he has been getting progressively worse and he is concerned about this.  He takes Crestor  and started baby aspirin  about a month ago. He does not smoke and does not have diabetes. His last lipid profile about a year ago showed an LDL of 69.  Past Medical History:  Diagnosis Date   Arthritis    CAD in native artery non obstructive disease 09/01/2019   Hemorrhoids    Hyperlipidemia    LVH (left ventricular hypertrophy) 08/12/2021   Perirectal abscess    Unstable angina (HCC) 08/12/2021    Past Surgical History:  Procedure Laterality Date   ANAL FISSURE REPAIR  2006   HERNIA REPAIR Right 2006   LEFT HEART CATH AND CORONARY ANGIOGRAPHY N/A 08/11/2021   Procedure: LEFT HEART CATH AND CORONARY ANGIOGRAPHY;  Surgeon: Burnard Debby LABOR, MD;  Location: [facility];  Service: Cardiovascular;  Laterality: N/A;   TOTAL SHOULDER ARTHROPLASTY Right 08/01/2019   Procedure: TOTAL SHOULDER ARTHROPLASTY;  Surgeon: Dozier Soulier, MD;  Location: [facility];  Service: Orthopedics;  Laterality: Right;   TRANSANAL EXCISION OF RECTAL MASS N/A  01/15/2024   Procedure: EXCISION, MASS, RECTUM, ANAL APPROACH;  Surgeon: Teresa Lonni HERO, MD;  Location: [facility];  Service: General;  Laterality: N/A;  TRANSANAL EXCISION OF DISTAL RECTAL LESION    Current Medications: Active Medications[1]   Allergies:   Patient has no known allergies.   Social History   Socioeconomic History   Marital status: Married    Spouse name: Not on file   Number of children: 1   Years of education: Not on file   Highest education level: Not on file  Occupational History   Occupation: furniture business  Tobacco Use   Smoking status: Never   Smokeless tobacco: Never  Vaping Use   Vaping status: Never Used  Substance and Sexual Activity   Alcohol use: Not Currently    Alcohol/week: 4.0 standard drinks of alcohol    Types: 4 Standard drinks or equivalent per week    Comment: ocassional   Drug use: No   Sexual activity: Yes  Other Topics Concern   Not on file  Social History Narrative   Not on file   Social Drivers of Health   Tobacco Use: Low Risk (07/09/2024)   Patient History    Smoking Tobacco Use: Never    Smokeless Tobacco Use: Never    Passive Exposure: Not on file  Financial Resource Strain: Not on file  Food Insecurity: Not on file  Transportation Needs: Not on file  Physical Activity: Not on file  Stress: Not on  file  Social Connections: Not on file  Depression (PHQ2-9): Low Risk (09/04/2022)   Depression (PHQ2-9)    PHQ-2 Score: 0  Alcohol Screen: Not on file  Housing: Unknown (09/25/2023)   Received from [facility]   Epic    Unable to Pay for Housing in the Last Year: Not on file    Number of Times Moved in the Last Year: Not on file    At any time in the past 12 months, were you homeless or living in a shelter (including now)?: No  Utilities: Not on file  Health Literacy: Not on file     Family History: The patient's family history includes Cancer in his father; Dementia in his father and mother;  Heart attack in his maternal grandmother. There is no history of Colon cancer, Stomach cancer, or Esophageal cancer.  ROS:   Review of Systems  Constitution: Negative for decreased appetite, fever and weight gain.  HENT: Negative for congestion, ear discharge, hoarse voice and sore throat.   Eyes: Negative for discharge, redness, vision loss in right eye and visual halos.  Cardiovascular: Negative for chest pain, dyspnea on exertion, leg swelling, orthopnea and palpitations.  Respiratory: Negative for cough, hemoptysis, shortness of breath and snoring.   Endocrine: Negative for heat intolerance and polyphagia.  Hematologic/Lymphatic: Negative for bleeding problem. Does not bruise/bleed easily.  Skin: Negative for flushing, nail changes, rash and suspicious lesions.  Musculoskeletal: Negative for arthritis, joint pain, muscle cramps, myalgias, neck pain and stiffness.  Gastrointestinal: Negative for abdominal pain, bowel incontinence, diarrhea and excessive appetite.  Genitourinary: Negative for decreased libido, genital sores and incomplete emptying.  Neurological: Negative for brief paralysis, focal weakness, headaches and loss of balance.  Psychiatric/Behavioral: Negative for altered mental status, depression and suicidal ideas.  Allergic/Immunologic: Negative for HIV exposure and persistent infections.    EKGs/Labs/Other Studies Reviewed:    The following studies were reviewed today:   EKG:  The ekg ordered today demonstrates   Recent Labs: 01/02/2024: BUN 14; Creatinine, Ser 0.81; Hemoglobin 14.3; Platelets 178; Potassium 4.0; Sodium 139  Recent Lipid Panel    Component Value Date/Time   CHOL 152 03/24/2023 1035   TRIG 94 03/24/2023 1035   HDL 65 03/24/2023 1035   CHOLHDL 2.3 03/24/2023 1035   VLDL 20 08/11/2021 0149   LDLCALC 69 03/24/2023 1035    Physical Exam:    VS:  BP 122/78   Pulse (!) 49   Ht 5' 11 (1.803 m)   Wt 194 lb 12.8 oz (88.4 kg)   SpO2 96%   BMI  27.17 kg/m     Wt Readings from Last 3 Encounters:  07/09/24 194 lb 12.8 oz (88.4 kg)  01/15/24 184 lb (83.5 kg)  01/01/24 185 lb (83.9 kg)     GEN: Well nourished, well developed in no acute distress HEENT: Normal NECK: No JVD; No carotid bruits LYMPHATICS: No lymphadenopathy CARDIAC: S1S2 noted,RRR, no murmurs, rubs, gallops RESPIRATORY:  Clear to auscultation without rales, wheezing or rhonchi  ABDOMEN: Soft, non-tender, non-distended, +bowel sounds, no guarding. EXTREMITIES: No edema, No cyanosis, no clubbing MUSCULOSKELETAL:  No deformity  SKIN: Warm and dry NEUROLOGIC:  Alert and oriented x 3, non-focal PSYCHIATRIC:  Normal affect, good insight  ASSESSMENT:    1. Chest discomfort   2. Hyperlipidemia, mixed   3. Hypertrophic nonobstructive cardiomyopathy (HCC)   4. CAD in native artery non obstructive disease    PLAN:   Coronary artery disease with typical angina Intermittent chest  pain with jaw discomfort suggests typical angina. Previous catheterization showed mild disease. Current EKG unchanged. Symptoms suggest possible disease progression. Explained catheterization rationale and potential outcomes. - Ordered heart catheterization to assess for progression.  This has been scheduled for July 12, 2024 - Continue Crestor  and baby aspirin . - Ordered CBC. - Scheduled follow-up with advanced practice practitioner two weeks post-procedure.  Nonobstructive hypertrophic cardiomyopathy-if his heart catheterization is normal we may need to put him on the treadmill to understand if he is experiencing symptoms from his hypertrophic cardiomyopathy.  Unfortunately I cannot put the patient on beta-blocker given his underlying bradycardia.  Will get some blood work today including his lipid panel, CBC c-Met and magnesium  He will follow-up in 2 weeks post cath.   The patient is in agreement with the above plan. The patient left the office in stable condition.  The patient will  follow up in   Medication Adjustments/Labs and Tests Ordered: Current medicines are reviewed at length with the patient today.  Concerns regarding medicines are outlined above.  Orders Placed This Encounter  Procedures   Comp Met (CMET)   Magnesium   Lipid panel   CBC   EKG 12-Lead   Meds ordered this encounter  Medications   DISCONTD: isosorbide  mononitrate (IMDUR ) 30 MG 24 hr tablet    Sig: Take 1 tablet (30 mg total) by mouth daily.    Dispense:  90 tablet    Refill:  3    Patient Instructions  Medication Instructions:  Your physician has recommended you make the following change in your medication:  START: Imdur  30 mg once daily  *If you need a refill on your cardiac medications before your next appointment, please call your pharmacy*  Lab Work: CMET, Mag, Lipids, CBC If you have labs (blood work) drawn today and your tests are completely normal, you will receive your results only by: MyChart Message (if you have MyChart) OR A paper copy in the mail If you have any lab test that is abnormal or we need to change your treatment, we will call you to review the results.  Testing/Procedures: Cath   [place] [facility] A DEPT OF [place]. [facility] AT [address] A DEPT OF THE [place]. [facility] [address] [place] [address] Dept: [phone] Loc: [phone]  Farhad Burleson  07/09/2024  You are scheduled for a Cardiac Catheterization on Friday December 26 with Dr. Copper  1. Please arrive at the [facility] (Main Entrance A) at [facility]: [address] [place], [place] [address] at 10:00 (This time is 2 hour(s) before your procedure to ensure your preparation).   Free valet parking service is available. You will check in at ADMITTING. The support person will be asked to wait in the waiting room.  It is OK to have someone drop you off and come back when you are ready to be discharged.    Special note: Every effort is made  to have your procedure done on time. Please understand that emergencies sometimes delay scheduled procedures.  2. Diet: Nothing to eat after midnight.   3. Hydration: You need to be well hydrated before your procedure. On December 26 you may drink approved liquids (see below) until 2 hours before the procedure, with 16 oz of water  as your last intake.   List of approved liquids water , clear juice, clear tea, black coffee, fruit juices, non-citric and without pulp, carbonated beverages, Gatorade, Kool -Aid, plain Jello-O  and plain ice popsicles.  4. Labs: You will need to have blood drawn on Wednesday, December 23 at [facility] (1st Floor), [address], [place], [place] [address]. You do not need to be fasting.  5. Medication instructions in preparation for your procedure:   Contrast Allergy: No   On the morning of your procedure, take your Aspirin  81 mg and any morning medicines NOT listed above.  You may use sips of water .  6. Plan to go home the same day, you will only stay overnight if medically necessary. 7. Bring a current list of your medications and current insurance cards. 8. You MUST have a responsible person to drive you home. 9. Someone MUST be with you the first 24 hours after you arrive home or your discharge will be delayed. 10. Please wear clothes that are easy to get on and off and wear slip-on shoes.  Thank you for allowing us  to care for you!   -- [place] [facility]   Follow-Up: At [facility], you and your health needs are our priority.  As part of our continuing mission to provide you with exceptional heart care, our providers are all part of one team.  This team includes your primary Cardiologist (physician) and Advanced Practice Providers or APPs (Physician Assistants and Nurse Practitioners) who all work together to provide you with the care you need, when you need it.  Your  next appointment:    2 weeks after cath  Provider:   One of our Advanced Practice Providers (APPs): Morse Clause, PA-C  Lamarr Satterfield, NP Miriam Shams, NP  Olivia Pavy, PA-C Josefa Beauvais, NP  Leontine Salen, PA-C Orren Fabry, PA-C  [place], PA-C Ernest Dick, NP  Damien Braver, NP Jon Hails, PA-C  Waddell Donath, PA-C    Dayna Dunn, PA-C  Scott Weaver, PA-C Lum Louis, NP Katlyn West, NP Callie Goodrich, PA-C  Xika Zhao, NP Sheng Haley, PA-C    Kathleen Johnson, PA-C   Adopting a Healthy Lifestyle.  Know what a healthy weight is for you (roughly BMI <25) and aim to maintain this   Aim for 7+ servings of fruits and vegetables daily   65-80+ fluid ounces of water  or unsweet tea for healthy kidneys   Limit to max 1 drink of alcohol per day; avoid smoking/tobacco   Limit animal fats in diet for cholesterol and heart health - choose grass fed whenever available   Avoid highly processed foods, and foods high in saturated/trans fats   Aim for low stress - take time to unwind and care for your mental health   Aim for 150 min of moderate intensity exercise weekly for heart health, and weights twice weekly for bone health   Aim for 7-9 hours of sleep daily   When it comes to diets, agreement about the perfect plan isnt easy to find, even among the experts. Experts at the [facility] of [employer] developed an idea known as the Healthy Eating Plate. Just imagine a plate divided into logical, healthy portions.   The emphasis is on diet quality:   Load up on vegetables and fruits - one-half of your plate: Aim for color and variety, and remember that potatoes dont count.   Go for whole grains - one-quarter of your plate: Whole wheat, barley, wheat berries, quinoa, oats, brown rice, and foods made with them. If you want pasta, go with whole wheat pasta.  Protein power - one-quarter of your plate: Fish, chicken, beans, and nuts are all healthy, versatile protein  sources. Limit red meat.   The diet, however, does go beyond the plate, offering a few other suggestions.   Use healthy plant oils, such as olive, canola, soy, corn, sunflower and peanut. Check the labels, and avoid partially hydrogenated oil, which have unhealthy trans fats.   If youre thirsty, drink water . Coffee and tea are good in moderation, but skip sugary drinks and limit milk and dairy products to one or two daily servings.   The type of carbohydrate in the diet is more important than the amount. Some sources of carbohydrates, such as vegetables, fruits, whole grains, and beans-are healthier than others.   Finally, stay active  Signed, Dany Walther, DO  07/09/2024 5:00 PM    [facility]    [1]  Current Meds  Medication Sig   Ascorbic Acid (VITAMIN C PO) Take 1 tablet by mouth in the morning. (Patient not taking: Reported on 07/09/2024)   aspirin  EC 81 MG tablet Take 81 mg by mouth at bedtime. Swallow whole.   B-COMPLEX-C PO Take 1 tablet by mouth in the morning.   METAMUCIL FIBER PO Take 1 tablet by mouth daily.   rosuvastatin  (CRESTOR ) 20 MG tablet Take 1 tablet by mouth every other day   VITAMIN D  PO Take 1 tablet by mouth in the morning.   [DISCONTINUED] isosorbide  mononitrate (IMDUR ) 30 MG 24 hr tablet Take 1 tablet (30 mg total) by mouth daily. (Patient not taking: Reported on 07/09/2024)   "

## 2024-07-10 LAB — LIPID PANEL
Chol/HDL Ratio: 3.4 ratio (ref 0.0–5.0)
Cholesterol, Total: 208 mg/dL — ABNORMAL HIGH (ref 100–199)
HDL: 61 mg/dL
LDL Chol Calc (NIH): 111 mg/dL — ABNORMAL HIGH (ref 0–99)
Triglycerides: 209 mg/dL — ABNORMAL HIGH (ref 0–149)
VLDL Cholesterol Cal: 36 mg/dL (ref 5–40)

## 2024-07-10 LAB — CBC
Hematocrit: 45.1 % (ref 37.5–51.0)
Hemoglobin: 14.4 g/dL (ref 13.0–17.7)
MCH: 29.9 pg (ref 26.6–33.0)
MCHC: 31.9 g/dL (ref 31.5–35.7)
MCV: 94 fL (ref 79–97)
Platelets: 200 x10E3/uL (ref 150–450)
RBC: 4.81 x10E6/uL (ref 4.14–5.80)
RDW: 12.9 % (ref 11.6–15.4)
WBC: 4.4 x10E3/uL (ref 3.4–10.8)

## 2024-07-10 LAB — COMPREHENSIVE METABOLIC PANEL WITH GFR
ALT: 14 IU/L (ref 0–44)
AST: 17 IU/L (ref 0–40)
Albumin: 4.3 g/dL (ref 3.9–4.9)
Alkaline Phosphatase: 99 IU/L (ref 47–123)
BUN/Creatinine Ratio: 17 (ref 10–24)
BUN: 15 mg/dL (ref 8–27)
Bilirubin Total: 0.3 mg/dL (ref 0.0–1.2)
CO2: 23 mmol/L (ref 20–29)
Calcium: 10.2 mg/dL (ref 8.6–10.2)
Chloride: 104 mmol/L (ref 96–106)
Creatinine, Ser: 0.9 mg/dL (ref 0.76–1.27)
Globulin, Total: 2.2 g/dL (ref 1.5–4.5)
Glucose: 87 mg/dL (ref 70–99)
Potassium: 4.2 mmol/L (ref 3.5–5.2)
Sodium: 139 mmol/L (ref 134–144)
Total Protein: 6.5 g/dL (ref 6.0–8.5)
eGFR: 97 mL/min/1.73

## 2024-07-10 LAB — MAGNESIUM: Magnesium: 2.4 mg/dL — ABNORMAL HIGH (ref 1.6–2.3)

## 2024-07-11 ENCOUNTER — Ambulatory Visit: Payer: Self-pay | Admitting: Cardiology

## 2024-07-11 DIAGNOSIS — E782 Mixed hyperlipidemia: Secondary | ICD-10-CM

## 2024-07-12 ENCOUNTER — Encounter (HOSPITAL_COMMUNITY)
Admission: RE | Disposition: A | Discharge status: Home / Self Care | Payer: Self-pay | Source: Home / Self Care | Attending: Cardiovascular Disease

## 2024-07-12 ENCOUNTER — Other Ambulatory Visit: Payer: Self-pay

## 2024-07-12 ENCOUNTER — Ambulatory Visit (HOSPITAL_COMMUNITY)
Admission: RE | Admit: 2024-07-12 | Discharge: 2024-07-12 | Disposition: A | Discharge status: Home / Self Care | Attending: Cardiovascular Disease | Admitting: Cardiovascular Disease

## 2024-07-12 DIAGNOSIS — Z79899 Other long term (current) drug therapy: Secondary | ICD-10-CM | POA: Diagnosis not present

## 2024-07-12 DIAGNOSIS — I25118 Atherosclerotic heart disease of native coronary artery with other forms of angina pectoris: Secondary | ICD-10-CM | POA: Insufficient documentation

## 2024-07-12 DIAGNOSIS — I422 Other hypertrophic cardiomyopathy: Secondary | ICD-10-CM | POA: Insufficient documentation

## 2024-07-12 DIAGNOSIS — Z7982 Long term (current) use of aspirin: Secondary | ICD-10-CM | POA: Diagnosis not present

## 2024-07-12 DIAGNOSIS — I2584 Coronary atherosclerosis due to calcified coronary lesion: Secondary | ICD-10-CM | POA: Insufficient documentation

## 2024-07-12 DIAGNOSIS — E782 Mixed hyperlipidemia: Secondary | ICD-10-CM | POA: Diagnosis not present

## 2024-07-12 DIAGNOSIS — I25119 Atherosclerotic heart disease of native coronary artery with unspecified angina pectoris: Secondary | ICD-10-CM | POA: Diagnosis not present

## 2024-07-12 HISTORY — PX: LEFT HEART CATH AND CORONARY ANGIOGRAPHY: CATH118249

## 2024-07-12 SURGERY — LEFT HEART CATH AND CORONARY ANGIOGRAPHY
Anesthesia: LOCAL

## 2024-07-12 MED ORDER — VERAPAMIL HCL 2.5 MG/ML IV SOLN
INTRAVENOUS | Status: AC
  Filled 2024-07-12: qty 2

## 2024-07-12 MED ORDER — FREE WATER: 500.0000 mL | Freq: Once | Status: DC

## 2024-07-12 MED ORDER — LIDOCAINE HCL (PF) 1 % IJ SOLN
INTRAMUSCULAR | Status: AC
  Filled 2024-07-12: qty 30

## 2024-07-12 MED ORDER — SODIUM CHLORIDE 0.9% FLUSH: 3.0000 mL | INTRAVENOUS | Status: DC | PRN

## 2024-07-12 MED ORDER — ACETAMINOPHEN 325 MG PO TABS: 650.0000 mg | ORAL_TABLET | ORAL | Status: DC | PRN

## 2024-07-12 MED ORDER — HYDRALAZINE HCL 20 MG/ML IJ SOLN: 10.0000 mg | INTRAMUSCULAR | Status: DC | PRN

## 2024-07-12 MED ORDER — HEPARIN SODIUM (PORCINE) 1000 UNIT/ML IJ SOLN
INTRAMUSCULAR | Status: AC
  Filled 2024-07-12: qty 10

## 2024-07-12 MED ORDER — ONDANSETRON HCL 4 MG/2ML IJ SOLN: 4.0000 mg | Freq: Four times a day (QID) | INTRAMUSCULAR | Status: DC | PRN

## 2024-07-12 MED ORDER — IOHEXOL 350 MG/ML SOLN
INTRAVENOUS | Status: DC | PRN
  Administered 2024-07-12: 50 mL

## 2024-07-12 MED ORDER — ASPIRIN 81 MG PO CHEW
81.0000 mg | CHEWABLE_TABLET | ORAL | Status: AC
  Administered 2024-07-12: 81 mg via ORAL
  Filled 2024-07-12: qty 1

## 2024-07-12 MED ORDER — SODIUM CHLORIDE 0.9 % IV SOLN: 250.0000 mL | INTRAVENOUS | Status: DC | PRN

## 2024-07-12 MED ORDER — SODIUM CHLORIDE 0.9% FLUSH: 3.0000 mL | Freq: Two times a day (BID) | INTRAVENOUS | Status: DC

## 2024-07-12 MED ORDER — MIDAZOLAM HCL 2 MG/2ML IJ SOLN
INTRAMUSCULAR | Status: AC
  Filled 2024-07-12: qty 2

## 2024-07-12 MED ORDER — FENTANYL CITRATE (PF) 100 MCG/2ML IJ SOLN
INTRAMUSCULAR | Status: AC
  Filled 2024-07-12: qty 2

## 2024-07-12 MED ORDER — VERAPAMIL HCL 2.5 MG/ML IV SOLN
INTRAVENOUS | Status: DC | PRN
  Administered 2024-07-12: 10 mL via INTRA_ARTERIAL

## 2024-07-12 MED ORDER — HEPARIN SODIUM (PORCINE) 1000 UNIT/ML IJ SOLN
INTRAMUSCULAR | Status: DC | PRN
  Administered 2024-07-12: 5000 [IU] via INTRAVENOUS

## 2024-07-12 MED ORDER — MIDAZOLAM HCL (PF) 2 MG/2ML IJ SOLN
INTRAMUSCULAR | Status: DC | PRN
  Administered 2024-07-12: 2 mg via INTRAVENOUS

## 2024-07-12 MED ORDER — HEPARIN (PORCINE) IN NACL 1000-0.9 UT/500ML-% IV SOLN
INTRAVENOUS | Status: DC | PRN
  Administered 2024-07-12 (×2): 500 mL

## 2024-07-12 MED ORDER — LABETALOL HCL 5 MG/ML IV SOLN: 10.0000 mg | INTRAVENOUS | Status: DC | PRN

## 2024-07-12 MED ORDER — LIDOCAINE HCL (PF) 1 % IJ SOLN
INTRAMUSCULAR | Status: DC | PRN
  Administered 2024-07-12: 2 mL

## 2024-07-12 MED ORDER — FENTANYL CITRATE (PF) 100 MCG/2ML IJ SOLN
INTRAMUSCULAR | Status: DC | PRN
  Administered 2024-07-12: 25 ug via INTRAVENOUS

## 2024-07-12 SURGICAL SUPPLY — 6 items
CATH 5FR JL3.5 JR4 ANG PIG MP (CATHETERS) IMPLANT
DEVICE RAD COMP TR BAND LRG (VASCULAR PRODUCTS) IMPLANT
GLIDESHEATH SLEND SS 6F .021 (SHEATH) IMPLANT
GUIDEWIRE INQWIRE 1.5J.035X260 (WIRE) IMPLANT
PACK CARDIAC CATHETERIZATION (CUSTOM PROCEDURE TRAY) ×1 IMPLANT
SET ATX-X65L (MISCELLANEOUS) IMPLANT

## 2024-07-12 NOTE — Progress Notes (Signed)
 Discharge instructions reviewed with patient at bedside and wife via phone. Denies questions concerns. PT tolerated PO intake. Ambulated in the hallway, was able to void without difficulty. Incision site remains clean dry and intact. No s/s of complications. PT escorted from the unit via wheel chair to personal vehicle.

## 2024-07-12 NOTE — Interval H&P Note (Signed)
 History and Physical Interval Note:  07/12/2024 11:07 AM  Dave Rogers  has presented today for surgery, with the diagnosis of cad - angina.  The various methods of treatment have been discussed with the patient and family. After consideration of risks, benefits and other options for treatment, the patient has consented to  Procedures: LEFT HEART CATH AND CORONARY ANGIOGRAPHY (N/A) as a surgical intervention.  The patient's history has been reviewed, patient examined, no change in status, stable for surgery.  I have reviewed the patient's chart and labs.  Questions were answered to the patient's satisfaction.     Ozell Fell

## 2024-07-15 ENCOUNTER — Encounter (HOSPITAL_COMMUNITY): Payer: Self-pay | Admitting: Cardiovascular Disease

## 2024-07-22 MED ORDER — ROSUVASTATIN CALCIUM 20 MG PO TABS: 20.0000 mg | ORAL_TABLET | Freq: Every day | ORAL | 3 refills | Status: AC

## 2024-07-24 ENCOUNTER — Other Ambulatory Visit

## 2024-08-09 ENCOUNTER — Ambulatory Visit: Payer: Self-pay | Admitting: Surgery

## 2024-08-09 NOTE — Progress Notes (Signed)
 Sent message, via epic in basket, requesting orders in epic from Careers adviser.

## 2024-08-13 NOTE — Patient Instructions (Addendum)
 SURGICAL WAITING ROOM VISITATION  Patients having surgery or a procedure may have no more than 2 support people in the waiting area - these visitors may rotate.    Children ages 82 and under will not be able to visit patients in [facility] under most circumstances.   Visitors with respiratory illnesses are discouraged from visiting and should remain at home.  If the patient needs to stay at the hospital during part of their recovery, the visitor guidelines for inpatient rooms apply. Pre-op nurse will coordinate an appropriate time for 1 support person to accompany patient in pre-op.  This support person may not rotate.    Please refer to the [facility] website for the visitor guidelines for Inpatients (after your surgery is over and you are in a regular room).       Your procedure is scheduled on: 08/19/24   Report to [facility] Main Entrance    Report to admitting at 5:15 AM   Call this number if you have problems the morning of surgery [phone]   Do not eat food or drink liquids:After Midnight.but may have sips of water  to take meds.   FOLLOW BOWEL PREP AND ANY ADDITIONAL PRE OP INSTRUCTIONS YOU RECEIVED FROM YOUR SURGEON'S OFFICE!!!          Fleet Enema     Oral Hygiene is also important to reduce your risk of infection.                                    Remember - BRUSH YOUR TEETH THE MORNING OF SURGERY WITH YOUR REGULAR TOOTHPASTE    Stop all vitamins and herbal supplements 7 days before surgery.   Take these medicines the morning of surgery with A SIP OF WATER : Rosuvastatin (crestor )              You may not have any metal on your body including hair pins, jewelry, and body piercing             Do not wear  lotions, powders, cologne, or deodorant              Men may shave face and neck.   Do not bring valuables to the hospital. [facility] IS NOT             RESPONSIBLE   FOR VALUABLES.   Contacts, glasses, dentures or bridgework may not be  worn into surgery.   DO NOT BRING YOUR HOME MEDICATIONS TO THE HOSPITAL. PHARMACY WILL DISPENSE MEDICATIONS LISTED ON YOUR MEDICATION LIST TO YOU DURING YOUR ADMISSION IN THE HOSPITAL!    Patients discharged on the day of surgery will not be allowed to drive home.  Someone NEEDS to stay with you for the first 24 hours after anesthesia.   Special Instructions: Bring a copy of your healthcare power of attorney and living will documents the day of surgery if you haven't scanned them before.              Please read over the following fact sheets you were given: IF YOU HAVE QUESTIONS ABOUT YOUR PRE-OP INSTRUCTIONS PLEASE CALL [phone] [place]   If you received a COVID test during your pre-op visit  it is requested that you wear a mask when out in public, stay away from anyone that may not be feeling well and notify your surgeon if you develop symptoms. If you test positive for  Covid or have been in contact with anyone that has tested positive in the last 10 days please notify you surgeon.    [place] - Preparing for Surgery Before surgery, you can play an important role.  Because skin is not sterile, your skin needs to be as free of germs as possible.  You can reduce the number of germs on your skin by washing with CHG (chlorahexidine gluconate) soap before surgery.  CHG is an antiseptic cleaner which kills germs and bonds with the skin to continue killing germs even after washing. Please DO NOT use if you have an allergy to CHG or antibacterial soaps.  If your skin becomes reddened/irritated stop using the CHG and inform your nurse when you arrive at Short Stay. Do not shave (including legs and underarms) for at least 48 hours prior to the first CHG shower.  You may shave your face/neck.  Please follow these instructions carefully:  1.  Shower with CHG Soap the night before surgery ONLY (DO NOT USE THE SOAP THE MORNING OF SURGERY).  2.  If you choose to wash your hair, wash your hair first as  usual with your normal  shampoo.  3.  After you shampoo, rinse your hair and body thoroughly to remove the shampoo.                             4.  Use CHG as you would any other liquid soap.  You can apply chg directly to the skin and wash.  Gently with a scrungie or clean washcloth.  5.  Apply the CHG Soap to your body ONLY FROM THE NECK DOWN.   Do   not use on face/ open                           Wound or open sores. Avoid contact with eyes, ears mouth and   genitals (private parts).                       Wash face,  Genitals (private parts) with your normal soap.             6.  Wash thoroughly, paying special attention to the area where your    surgery  will be performed.  7.  Thoroughly rinse your body with warm water  from the neck down.  8.  DO NOT shower/wash with your normal soap after using and rinsing off the CHG Soap.                9.  Pat yourself dry with a clean towel.            10.  Wear clean pajamas.            11.  Place clean sheets on your bed the night of your first shower and do not  sleep with pets. Day of Surgery : Do not apply any CHG, lotions/deodorants the morning of surgery.  Please wear clean clothes to the hospital/surgery center.  FAILURE TO FOLLOW THESE INSTRUCTIONS MAY RESULT IN THE CANCELLATION OF YOUR SURGERY  PATIENT SIGNATURE_________________________________  NURSE SIGNATURE__________________________________  ________________________________________________________________________

## 2024-08-13 NOTE — Progress Notes (Addendum)
 COVID Vaccine received:  []  No [x]  Yes Date of any COVID positive Test in last 90 days: no PCP - Does not have one Cardiologist - Ozell Fell MD  Chest x-ray -  EKG -  07/09/24 Epic Stress Test - 12/02/21 Epic ECHO - 02/23/23 Epic Cardiac Cath - 07/12/24 Epic  Bowel Prep - [x]  No  []   Yes ______  Pacemaker / ICD device [x]  No []  Yes   Spinal Cord Stimulator:[x]  No []  Yes       History of Sleep Apnea? [x]  No []  Yes   CPAP used?- [x]  No []  Yes    Does the patient monitor blood sugar?          [x]  No []  Yes  []  N/A  Patient has: [x]  NO Hx DM   []  Pre-DM                 []  DM1  []   DM2 Does patient have a [employer] or Dexacom? []  No []  Yes   Fasting Blood Sugar Ranges-  Checks Blood Sugar _____ times a day  GLP1 agonist / usual dose - no GLP1 instructions:  SGLT-2 inhibitors / usual dose -no  SGLT-2 instructions:   Blood Thinner / Instructions:no Aspirin  Instructions:no  Comments:   Activity level: Patient is able to climb a flight of stairs without difficulty; [x]  No CP  [x]  No SOB,  Patient can perform ADLs without assistance.   Anesthesia review: CAD, septal variant hypertrophic cardiomyopathy  Patient denies shortness of breath, fever, cough and chest pain at PAT appointment.  Patient verbalized understanding and agreement to the Pre-Surgical Instructions that were given to them antric. hypertrophyt this PAT appointment. Patient was also educated of the need to review these PAT instructions again prior to his/her surgery.I reviewed the appropriate phone numbers to call if they have any and questions or concerns.

## 2024-08-16 ENCOUNTER — Other Ambulatory Visit: Payer: Self-pay

## 2024-08-16 ENCOUNTER — Encounter (HOSPITAL_COMMUNITY): Payer: Self-pay

## 2024-08-16 ENCOUNTER — Encounter (HOSPITAL_COMMUNITY)
Admission: RE | Admit: 2024-08-16 | Discharge: 2024-08-16 | Disposition: A | Source: Ambulatory Visit | Attending: Surgery

## 2024-08-16 VITALS — BP 137/80 | HR 58 | Temp 98.1°F | Resp 16 | Ht 71.0 in | Wt 185.0 lb

## 2024-08-16 DIAGNOSIS — K612 Anorectal abscess: Secondary | ICD-10-CM | POA: Insufficient documentation

## 2024-08-16 DIAGNOSIS — I422 Other hypertrophic cardiomyopathy: Secondary | ICD-10-CM | POA: Diagnosis not present

## 2024-08-16 DIAGNOSIS — E785 Hyperlipidemia, unspecified: Secondary | ICD-10-CM | POA: Diagnosis not present

## 2024-08-16 DIAGNOSIS — Z01812 Encounter for preprocedural laboratory examination: Secondary | ICD-10-CM | POA: Diagnosis not present

## 2024-08-16 DIAGNOSIS — I2510 Atherosclerotic heart disease of native coronary artery without angina pectoris: Secondary | ICD-10-CM | POA: Diagnosis not present

## 2024-08-16 DIAGNOSIS — Z01818 Encounter for other preprocedural examination: Secondary | ICD-10-CM

## 2024-08-16 LAB — CBC
HCT: 46.2 % (ref 39.0–52.0)
Hemoglobin: 15.3 g/dL (ref 13.0–17.0)
MCH: 30.5 pg (ref 26.0–34.0)
MCHC: 33.1 g/dL (ref 30.0–36.0)
MCV: 92.2 fL (ref 80.0–100.0)
Platelets: 189 10*3/uL (ref 150–400)
RBC: 5.01 MIL/uL (ref 4.22–5.81)
RDW: 13.4 % (ref 11.5–15.5)
WBC: 4.3 10*3/uL (ref 4.0–10.5)
nRBC: 0 % (ref 0.0–0.2)

## 2024-08-16 NOTE — Anesthesia Preprocedure Evaluation (Signed)
"                                    Anesthesia Evaluation  Patient identified by MRN, date of birth, ID band Patient awake    Reviewed: Allergy & Precautions, NPO status , Patient's Chart, lab work & pertinent test results, reviewed documented beta blocker date and time   History of Anesthesia Complications Negative for: history of anesthetic complications  Airway Mallampati: II  TM Distance: >3 FB     Dental no notable dental hx.    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  Nonobstructive CAD   Neuro/Psych neg Seizures    GI/Hepatic ,neg GERD  ,,(+) neg Cirrhosis        Endo/Other    Renal/GU Renal disease     Musculoskeletal  (+) Arthritis , Osteoarthritis,    Abdominal   Peds  Hematology   Anesthesia Other Findings   Reproductive/Obstetrics                              Anesthesia Physical Anesthesia Plan  ASA: 2  Anesthesia Plan: General   Post-op Pain Management:    Induction: Intravenous  PONV Risk Score and Plan: 2 and Ondansetron  and Dexamethasone   Airway Management Planned: Oral ETT  Additional Equipment:   Intra-op Plan:   Post-operative Plan: Extubation in OR  Informed Consent: I have reviewed the patients History and Physical, chart, labs and discussed the procedure including the risks, benefits and alternatives for the proposed anesthesia with the patient or authorized representative who has indicated his/her understanding and acceptance.     Dental advisory given  Plan Discussed with: CRNA  Anesthesia Plan Comments: (See PAT note from 1/30)         Anesthesia Quick Evaluation  "

## 2024-08-19 ENCOUNTER — Ambulatory Visit (HOSPITAL_COMMUNITY): Admitting: Certified Registered Nurse Anesthetist

## 2024-08-19 ENCOUNTER — Encounter (HOSPITAL_COMMUNITY): Payer: Self-pay | Admitting: Medical

## 2024-08-19 ENCOUNTER — Ambulatory Visit (HOSPITAL_COMMUNITY)
Admission: RE | Admit: 2024-08-19 | Discharge: 2024-08-19 | Disposition: A | Discharge status: Home / Self Care | Attending: Surgery | Admitting: Surgery

## 2024-08-19 ENCOUNTER — Encounter (HOSPITAL_COMMUNITY)
Admission: RE | Disposition: A | Discharge status: Home / Self Care | Payer: Self-pay | Source: Home / Self Care | Attending: Surgery

## 2024-08-19 ENCOUNTER — Other Ambulatory Visit: Payer: Self-pay

## 2024-08-19 ENCOUNTER — Encounter (HOSPITAL_COMMUNITY): Payer: Self-pay | Admitting: Surgery

## 2024-08-19 DIAGNOSIS — I2510 Atherosclerotic heart disease of native coronary artery without angina pectoris: Secondary | ICD-10-CM | POA: Insufficient documentation

## 2024-08-19 DIAGNOSIS — I252 Old myocardial infarction: Secondary | ICD-10-CM | POA: Insufficient documentation

## 2024-08-19 DIAGNOSIS — K648 Other hemorrhoids: Secondary | ICD-10-CM | POA: Insufficient documentation

## 2024-08-19 DIAGNOSIS — K603 Anal fistula, unspecified: Secondary | ICD-10-CM | POA: Diagnosis not present

## 2024-08-19 DIAGNOSIS — M1990 Unspecified osteoarthritis, unspecified site: Secondary | ICD-10-CM | POA: Insufficient documentation

## 2024-08-19 DIAGNOSIS — K60329 Anal fistula, complex, unspecified: Secondary | ICD-10-CM | POA: Insufficient documentation

## 2024-08-19 MED ORDER — TRAMADOL HCL 50 MG PO TABS: 50.0000 mg | ORAL_TABLET | Freq: Four times a day (QID) | ORAL | 0 refills | Status: AC | PRN

## 2024-08-19 MED ORDER — BUPIVACAINE-EPINEPHRINE (PF) 0.25% -1:200000 IJ SOLN
INTRAMUSCULAR | Status: DC | PRN
  Administered 2024-08-19: 30 mL

## 2024-08-19 MED ORDER — BUPIVACAINE-EPINEPHRINE (PF) 0.25% -1:200000 IJ SOLN
INTRAMUSCULAR | Status: AC
  Filled 2024-08-19: qty 30

## 2024-08-19 MED ORDER — PROPOFOL 10 MG/ML IV BOLUS
INTRAVENOUS | Status: AC
  Filled 2024-08-19: qty 20

## 2024-08-19 MED ORDER — DIBUCAINE (PERIANAL) 1 % EX OINT
TOPICAL_OINTMENT | CUTANEOUS | Status: AC
  Filled 2024-08-19: qty 28

## 2024-08-19 MED ORDER — FENTANYL CITRATE (PF) 100 MCG/2ML IJ SOLN
INTRAMUSCULAR | Status: AC
  Filled 2024-08-19: qty 2

## 2024-08-19 MED ORDER — EPHEDRINE 5 MG/ML INJ
INTRAVENOUS | Status: AC
  Filled 2024-08-19: qty 5

## 2024-08-19 MED ORDER — 0.9 % SODIUM CHLORIDE (POUR BTL) OPTIME
TOPICAL | Status: DC | PRN
  Administered 2024-08-19: 1000 mL

## 2024-08-19 MED ORDER — CHLORHEXIDINE GLUCONATE CLOTH 2 % EX PADS: 6.0000 | MEDICATED_PAD | Freq: Once | CUTANEOUS | Status: DC

## 2024-08-19 MED ORDER — METHYLENE BLUE 20 MG/2ML IV SOSY
PREFILLED_SYRINGE | INTRAVENOUS | Status: DC | PRN
  Administered 2024-08-19: 10 mL via SUBMUCOSAL

## 2024-08-19 MED ORDER — ONDANSETRON HCL 4 MG/2ML IJ SOLN
INTRAMUSCULAR | Status: AC
  Filled 2024-08-19: qty 2

## 2024-08-19 MED ORDER — FLEET ENEMA RE ENEM: 1.0000 | ENEMA | Freq: Once | RECTAL | Status: DC

## 2024-08-19 MED ORDER — SUGAMMADEX SODIUM 200 MG/2ML IV SOLN
INTRAVENOUS | Status: DC | PRN
  Administered 2024-08-19: 200 mg via INTRAVENOUS

## 2024-08-19 MED ORDER — SODIUM CHLORIDE 0.9 % IV SOLN
2.0000 g | INTRAVENOUS | Status: AC
  Administered 2024-08-19: 2 g via INTRAVENOUS
  Filled 2024-08-19: qty 20

## 2024-08-19 MED ORDER — METRONIDAZOLE 500 MG/100ML IV SOLN
500.0000 mg | INTRAVENOUS | Status: AC
  Administered 2024-08-19: 500 mg via INTRAVENOUS
  Filled 2024-08-19: qty 100

## 2024-08-19 MED ORDER — ARTIFICIAL TEARS OPHTHALMIC OINT
TOPICAL_OINTMENT | OPHTHALMIC | Status: AC
  Filled 2024-08-19: qty 3.5

## 2024-08-19 MED ORDER — LIDOCAINE HCL (CARDIAC) PF 100 MG/5ML IV SOSY
PREFILLED_SYRINGE | INTRAVENOUS | Status: DC | PRN
  Administered 2024-08-19: 100 mg via INTRATRACHEAL

## 2024-08-19 MED ORDER — PROPOFOL 10 MG/ML IV BOLUS
INTRAVENOUS | Status: DC | PRN
  Administered 2024-08-19: 120 mg via INTRAVENOUS

## 2024-08-19 MED ORDER — LACTATED RINGERS IV SOLN: INTRAVENOUS | Status: DC

## 2024-08-19 MED ORDER — OXYCODONE HCL 5 MG/5ML PO SOLN: 5.0000 mg | Freq: Once | ORAL | Status: DC | PRN

## 2024-08-19 MED ORDER — DEXAMETHASONE SOD PHOSPHATE PF 10 MG/ML IJ SOLN
INTRAMUSCULAR | Status: AC
  Filled 2024-08-19: qty 1

## 2024-08-19 MED ORDER — DIBUCAINE (PERIANAL) 1 % EX OINT
TOPICAL_OINTMENT | CUTANEOUS | Status: DC | PRN
  Administered 2024-08-19: 1 via RECTAL

## 2024-08-19 MED ORDER — FENTANYL CITRATE (PF) 50 MCG/ML IJ SOSY: 25.0000 ug | PREFILLED_SYRINGE | INTRAMUSCULAR | Status: DC | PRN

## 2024-08-19 MED ORDER — METHYLENE BLUE (ANTIDOTE) 1 % IV SOLN
INTRAVENOUS | Status: AC
  Filled 2024-08-19: qty 10

## 2024-08-19 MED ORDER — ORAL CARE MOUTH RINSE: 15.0000 mL | Freq: Once | OROMUCOSAL | Status: AC

## 2024-08-19 MED ORDER — MIDAZOLAM HCL 2 MG/2ML IJ SOLN
INTRAMUSCULAR | Status: AC
  Filled 2024-08-19: qty 2

## 2024-08-19 MED ORDER — DEXAMETHASONE SOD PHOSPHATE PF 10 MG/ML IJ SOLN
INTRAMUSCULAR | Status: DC | PRN
  Administered 2024-08-19: 8 mg via INTRAVENOUS

## 2024-08-19 MED ORDER — EPHEDRINE SULFATE (PRESSORS) 25 MG/5ML IV SOSY
PREFILLED_SYRINGE | INTRAVENOUS | Status: DC | PRN
  Administered 2024-08-19: 10 mg via INTRAVENOUS

## 2024-08-19 MED ORDER — BUPIVACAINE LIPOSOME 1.3 % IJ SUSP
INTRAMUSCULAR | Status: AC
  Filled 2024-08-19: qty 20

## 2024-08-19 MED ORDER — ROCURONIUM BROMIDE 10 MG/ML (PF) SYRINGE
PREFILLED_SYRINGE | INTRAVENOUS | Status: AC
  Filled 2024-08-19: qty 10

## 2024-08-19 MED ORDER — ACETAMINOPHEN 500 MG PO TABS
1000.0000 mg | ORAL_TABLET | ORAL | Status: AC
  Administered 2024-08-19: 1000 mg via ORAL
  Filled 2024-08-19: qty 2

## 2024-08-19 MED ORDER — FENTANYL CITRATE (PF) 100 MCG/2ML IJ SOLN
INTRAMUSCULAR | Status: DC | PRN
  Administered 2024-08-19: 100 ug via INTRAVENOUS
  Administered 2024-08-19: 50 ug via INTRAVENOUS

## 2024-08-19 MED ORDER — ACETAMINOPHEN 10 MG/ML IV SOLN: 1000.0000 mg | Freq: Once | INTRAVENOUS | Status: DC | PRN

## 2024-08-19 MED ORDER — CHLORHEXIDINE GLUCONATE 0.12 % MT SOLN
15.0000 mL | Freq: Once | OROMUCOSAL | Status: AC
  Administered 2024-08-19: 15 mL via OROMUCOSAL

## 2024-08-19 MED ORDER — MIDAZOLAM HCL 5 MG/5ML IJ SOLN
INTRAMUSCULAR | Status: DC | PRN
  Administered 2024-08-19: 2 mg via INTRAVENOUS

## 2024-08-19 MED ORDER — ROCURONIUM BROMIDE 10 MG/ML (PF) SYRINGE
PREFILLED_SYRINGE | INTRAVENOUS | Status: DC | PRN
  Administered 2024-08-19: 60 mg via INTRAVENOUS

## 2024-08-19 MED ORDER — FLEET ENEMA RE ENEM
1.0000 | ENEMA | Freq: Once | RECTAL | Status: DC
  Filled 2024-08-19: qty 1

## 2024-08-19 MED ORDER — ONDANSETRON HCL 4 MG/2ML IJ SOLN
INTRAMUSCULAR | Status: DC | PRN
  Administered 2024-08-19: 4 mg via INTRAVENOUS

## 2024-08-19 MED ORDER — OXYCODONE HCL 5 MG PO TABS: 5.0000 mg | ORAL_TABLET | Freq: Once | ORAL | Status: DC | PRN

## 2024-08-19 MED ORDER — ONDANSETRON HCL 4 MG/2ML IJ SOLN: 4.0000 mg | Freq: Once | INTRAMUSCULAR | Status: DC | PRN

## 2024-08-19 NOTE — Discharge Instructions (Signed)

## 2024-08-19 NOTE — Anesthesia Procedure Notes (Signed)
 Procedure Name: Intubation Date/Time: 08/19/2024 7:34 AM  Performed by: Franchot Delon RAMAN, CRNAPre-anesthesia Checklist: Patient identified, Emergency Drugs available, Suction available and Patient being monitored Patient Re-evaluated:Patient Re-evaluated prior to induction Oxygen Delivery Method: Circle System Utilized Preoxygenation: Pre-oxygenation with 100% oxygen Induction Type: IV induction Ventilation: Mask ventilation without difficulty Laryngoscope Size: Mac and 4 Grade View: Grade I Tube type: Oral Tube size: 7.5 mm Number of attempts: 1 Airway Equipment and Method: Stylet and Oral airway Placement Confirmation: ETT inserted through vocal cords under direct vision, positive ETCO2 and breath sounds checked- equal and bilateral Secured at: 22 cm Tube secured with: Tape Dental Injury: Teeth and Oropharynx as per pre-operative assessment

## 2024-08-19 NOTE — Transfer of Care (Signed)
 Immediate Anesthesia Transfer of Care Note  Patient: Dave Rogers  Procedure(s) Performed: INCISION AND DRAINAGE, ABSCESS, PERIRECTAL PLACEMENT, SETON EXAM UNDER ANESTHESIA, RECTUM  Patient Location: PACU  Anesthesia Type:General  Level of Consciousness: awake, alert , oriented, and patient cooperative  Airway & Oxygen Therapy: Patient Spontanous Breathing and Patient connected to face mask oxygen  Post-op Assessment: Report given to RN and Post -op Vital signs reviewed and stable  Post vital signs: Reviewed and stable  Last Vitals:  Vitals Value Taken Time  BP 148/76 08/19/24 08:36  Temp    Pulse 86 08/19/24 08:39  Resp 12 08/19/24 08:39  SpO2 100 % 08/19/24 08:39  Vitals shown include unfiled device data.  Last Pain:  Vitals:   08/19/24 9367  TempSrc:   PainSc: 0-No pain         Complications: No notable events documented.

## 2024-08-19 NOTE — Anesthesia Postprocedure Evaluation (Signed)
"   Anesthesia Post Note  Patient: Dave Rogers  Procedure(s) Performed: INCISION AND DRAINAGE, ABSCESS, PERIRECTAL PLACEMENT, SETON EXAM UNDER ANESTHESIA, RECTUM     Patient location during evaluation: PACU Anesthesia Type: General Level of consciousness: awake and alert Pain management: pain level controlled Vital Signs Assessment: post-procedure vital signs reviewed and stable Respiratory status: spontaneous breathing, nonlabored ventilation, respiratory function stable and patient connected to nasal cannula oxygen Cardiovascular status: blood pressure returned to baseline and stable Postop Assessment: no apparent nausea or vomiting Anesthetic complications: no   No notable events documented.  Last Vitals:  Vitals:   08/19/24 0845 08/19/24 0900  BP: (!) 133/59 (!) 145/63  Pulse: 70 76  Resp: 15 16  Temp:    SpO2: 99% 99%    Last Pain:  Vitals:   08/19/24 0900  TempSrc:   PainSc: 0-No pain                 [name]      "

## 2024-08-20 ENCOUNTER — Encounter (HOSPITAL_COMMUNITY): Payer: Self-pay | Admitting: Surgery
